# Patient Record
Sex: Female | Born: 1955
Health system: Southern US, Community
[De-identification: ages and names within clinical notes are randomized; demographics above are authoritative.]

## PROBLEM LIST (undated history)

## (undated) DIAGNOSIS — F329 Major depressive disorder, single episode, unspecified: Secondary | ICD-10-CM

## (undated) DIAGNOSIS — R51 Headache: Secondary | ICD-10-CM

## (undated) DIAGNOSIS — E785 Hyperlipidemia, unspecified: Secondary | ICD-10-CM

## (undated) DIAGNOSIS — G43909 Migraine, unspecified, not intractable, without status migrainosus: Secondary | ICD-10-CM

## (undated) DIAGNOSIS — M8580 Other specified disorders of bone density and structure, unspecified site: Secondary | ICD-10-CM

## (undated) DIAGNOSIS — D649 Anemia, unspecified: Secondary | ICD-10-CM

## (undated) DIAGNOSIS — A64 Unspecified sexually transmitted disease: Secondary | ICD-10-CM

## (undated) DIAGNOSIS — F419 Anxiety disorder, unspecified: Secondary | ICD-10-CM

## (undated) DIAGNOSIS — E559 Vitamin D deficiency, unspecified: Secondary | ICD-10-CM

## (undated) DIAGNOSIS — M5126 Other intervertebral disc displacement, lumbar region: Secondary | ICD-10-CM

## (undated) DIAGNOSIS — G2581 Restless legs syndrome: Secondary | ICD-10-CM

## (undated) DIAGNOSIS — M797 Fibromyalgia: Secondary | ICD-10-CM

## (undated) DIAGNOSIS — F32A Depression, unspecified: Secondary | ICD-10-CM

## (undated) DIAGNOSIS — J479 Bronchiectasis, uncomplicated: Secondary | ICD-10-CM

## (undated) HISTORY — DX: Fibromyalgia: M79.7

## (undated) HISTORY — DX: Anemia, unspecified: D64.9

## (undated) HISTORY — DX: Hyperlipidemia, unspecified: E78.5

## (undated) HISTORY — DX: Headache: R51

## (undated) HISTORY — PX: SHOULDER SURGERY: SHX246

## (undated) HISTORY — DX: Anxiety disorder, unspecified: F41.9

## (undated) HISTORY — PX: LUMBAR LAMINECTOMY: SHX95

## (undated) HISTORY — DX: Other specified disorders of bone density and structure, unspecified site: M85.80

## (undated) HISTORY — DX: Bronchiectasis, uncomplicated: J47.9

## (undated) HISTORY — DX: Major depressive disorder, single episode, unspecified: F32.9

## (undated) HISTORY — DX: Depression, unspecified: F32.A

## (undated) HISTORY — PX: TUBAL LIGATION: SHX77

## (undated) HISTORY — DX: Restless legs syndrome: G25.81

## (undated) HISTORY — DX: Migraine, unspecified, not intractable, without status migrainosus: G43.909

## (undated) HISTORY — DX: Other intervertebral disc displacement, lumbar region: M51.26

## (undated) HISTORY — DX: Vitamin D deficiency, unspecified: E55.9

## (undated) HISTORY — PX: BREAST SURGERY: SHX581

## (undated) HISTORY — DX: Unspecified sexually transmitted disease: A64

## (undated) HISTORY — PX: BACK SURGERY: SHX140

---

## 1999-02-24 ENCOUNTER — Ambulatory Visit: Admission: RE | Admit: 1999-02-24 | Discharge: 1999-02-24 | Payer: Self-pay | Admitting: Otolaryngology

## 1999-03-11 ENCOUNTER — Other Ambulatory Visit: Admission: RE | Admit: 1999-03-11 | Discharge: 1999-03-11 | Payer: Self-pay | Admitting: Obstetrics and Gynecology

## 2000-03-11 ENCOUNTER — Other Ambulatory Visit: Admission: RE | Admit: 2000-03-11 | Discharge: 2000-03-11 | Payer: Self-pay | Admitting: Obstetrics and Gynecology

## 2000-03-31 ENCOUNTER — Encounter: Payer: Self-pay | Admitting: Obstetrics and Gynecology

## 2000-03-31 ENCOUNTER — Encounter: Admission: RE | Admit: 2000-03-31 | Discharge: 2000-03-31 | Payer: Self-pay | Admitting: Plastic Surgery

## 2000-04-12 HISTORY — PX: AUGMENTATION MAMMAPLASTY: SUR837

## 2002-09-20 ENCOUNTER — Other Ambulatory Visit: Admission: RE | Admit: 2002-09-20 | Discharge: 2002-09-20 | Payer: Self-pay | Admitting: Obstetrics and Gynecology

## 2003-10-08 ENCOUNTER — Encounter: Admission: RE | Admit: 2003-10-08 | Discharge: 2003-10-08 | Payer: Self-pay | Admitting: Obstetrics and Gynecology

## 2003-10-16 ENCOUNTER — Other Ambulatory Visit: Admission: RE | Admit: 2003-10-16 | Discharge: 2003-10-16 | Payer: Self-pay | Admitting: Obstetrics and Gynecology

## 2004-09-29 ENCOUNTER — Encounter: Payer: Self-pay | Admitting: Gastroenterology

## 2004-10-13 HISTORY — PX: COLONOSCOPY: SHX174

## 2004-10-22 ENCOUNTER — Ambulatory Visit: Payer: Self-pay | Admitting: Gastroenterology

## 2004-10-26 ENCOUNTER — Ambulatory Visit: Payer: Self-pay | Admitting: Gastroenterology

## 2004-12-14 ENCOUNTER — Other Ambulatory Visit: Admission: RE | Admit: 2004-12-14 | Discharge: 2004-12-14 | Payer: Self-pay | Admitting: Obstetrics and Gynecology

## 2005-06-03 ENCOUNTER — Ambulatory Visit: Payer: Self-pay | Admitting: Internal Medicine

## 2005-11-12 ENCOUNTER — Ambulatory Visit: Payer: Self-pay | Admitting: Internal Medicine

## 2005-12-22 ENCOUNTER — Other Ambulatory Visit: Admission: RE | Admit: 2005-12-22 | Discharge: 2005-12-22 | Payer: Self-pay | Admitting: Obstetrics and Gynecology

## 2006-01-05 ENCOUNTER — Encounter: Admission: RE | Admit: 2006-01-05 | Discharge: 2006-01-05 | Payer: Self-pay | Admitting: Obstetrics and Gynecology

## 2006-03-05 ENCOUNTER — Ambulatory Visit: Payer: Self-pay | Admitting: Internal Medicine

## 2006-07-20 ENCOUNTER — Ambulatory Visit: Payer: Self-pay | Admitting: Internal Medicine

## 2006-08-05 ENCOUNTER — Ambulatory Visit: Payer: Self-pay | Admitting: Internal Medicine

## 2006-09-30 ENCOUNTER — Ambulatory Visit: Payer: Self-pay | Admitting: Internal Medicine

## 2006-11-23 ENCOUNTER — Ambulatory Visit: Payer: Self-pay | Admitting: Internal Medicine

## 2006-11-25 ENCOUNTER — Ambulatory Visit: Payer: Self-pay | Admitting: Internal Medicine

## 2007-02-03 ENCOUNTER — Encounter: Admission: RE | Admit: 2007-02-03 | Discharge: 2007-02-03 | Payer: Self-pay | Admitting: Sports Medicine

## 2007-02-24 ENCOUNTER — Encounter: Admission: RE | Admit: 2007-02-24 | Discharge: 2007-02-24 | Payer: Self-pay | Admitting: Obstetrics and Gynecology

## 2007-03-03 ENCOUNTER — Encounter: Admission: RE | Admit: 2007-03-03 | Discharge: 2007-03-03 | Payer: Self-pay | Admitting: Sports Medicine

## 2007-03-10 ENCOUNTER — Other Ambulatory Visit: Admission: RE | Admit: 2007-03-10 | Discharge: 2007-03-10 | Payer: Self-pay | Admitting: Obstetrics and Gynecology

## 2007-03-20 ENCOUNTER — Ambulatory Visit: Payer: Self-pay | Admitting: Internal Medicine

## 2007-04-07 ENCOUNTER — Encounter: Admission: RE | Admit: 2007-04-07 | Discharge: 2007-04-07 | Payer: Self-pay | Admitting: Sports Medicine

## 2007-04-07 ENCOUNTER — Ambulatory Visit: Payer: Self-pay | Admitting: Internal Medicine

## 2007-04-07 LAB — CONVERTED CEMR LAB
AST: 22 units/L (ref 0–37)
Albumin: 3.7 g/dL (ref 3.5–5.2)
Basophils Absolute: 0 10*3/uL (ref 0.0–0.1)
Chloride: 104 meq/L (ref 96–112)
Cholesterol: 278 mg/dL (ref 0–200)
Creatinine, Ser: 0.8 mg/dL (ref 0.4–1.2)
Eosinophils Relative: 1.5 % (ref 0.0–5.0)
GFR calc Af Amer: 97 mL/min
GFR calc non Af Amer: 80 mL/min
HCT: 34.9 % — ABNORMAL LOW (ref 36.0–46.0)
HDL: 64.9 mg/dL (ref >39.0)
Hemoglobin, Urine: NEGATIVE
Hemoglobin: 11.8 g/dL — ABNORMAL LOW (ref 12.0–15.0)
Ketones, ur: NEGATIVE mg/dL
Leukocytes, UA: NEGATIVE
Lymphocytes Relative: 37.7 % (ref 12.0–46.0)
MCHC: 33.7 g/dL (ref 30.0–36.0)
MCV: 96.8 fL (ref 78.0–100.0)
Monocytes Relative: 11 % (ref 3.0–11.0)
Neutro Abs: 2.4 10*3/uL (ref 1.4–7.7)
Neutrophils Relative %: 48.9 % (ref 43.0–77.0)
Nitrite: NEGATIVE
RBC: 3.6 M/uL — ABNORMAL LOW (ref 3.87–5.11)
RDW: 12.9 % (ref 11.5–14.6)
TSH: 1.38 microintl units/mL (ref 0.35–5.50)
Triglycerides: 129 mg/dL (ref 0–149)
Urine Glucose: NEGATIVE mg/dL
WBC: 4.8 10*3/uL (ref 4.5–10.5)
pH: 8 (ref 5.0–8.0)

## 2007-07-01 ENCOUNTER — Ambulatory Visit: Payer: Self-pay | Admitting: Internal Medicine

## 2007-08-04 ENCOUNTER — Ambulatory Visit: Payer: Self-pay | Admitting: Internal Medicine

## 2007-08-04 LAB — CONVERTED CEMR LAB
Albumin: 3.9 g/dL (ref 3.5–5.2)
Alkaline Phosphatase: 76 units/L (ref 39–117)
Bilirubin, Direct: 0.1 mg/dL (ref 0.0–0.3)
Cholesterol: 393 mg/dL (ref 0–200)
Direct LDL: 270.8 mg/dL
Rhuematoid fact SerPl-aCnc: <20 intl units/mL — ABNORMAL LOW (ref 0.0–20.0)
Sed Rate: 17 mm/hr (ref 0–25)
Total CHOL/HDL Ratio: 7.2

## 2007-09-27 ENCOUNTER — Telehealth: Payer: Self-pay | Admitting: Internal Medicine

## 2007-09-28 ENCOUNTER — Ambulatory Visit: Payer: Self-pay | Admitting: Cardiology

## 2007-10-11 ENCOUNTER — Encounter: Payer: Self-pay | Admitting: Internal Medicine

## 2007-10-31 DIAGNOSIS — R519 Headache, unspecified: Secondary | ICD-10-CM | POA: Insufficient documentation

## 2007-10-31 DIAGNOSIS — F32A Depression, unspecified: Secondary | ICD-10-CM | POA: Insufficient documentation

## 2007-10-31 DIAGNOSIS — F329 Major depressive disorder, single episode, unspecified: Secondary | ICD-10-CM

## 2007-10-31 DIAGNOSIS — F411 Generalized anxiety disorder: Secondary | ICD-10-CM | POA: Insufficient documentation

## 2007-10-31 DIAGNOSIS — R51 Headache: Secondary | ICD-10-CM | POA: Insufficient documentation

## 2007-10-31 DIAGNOSIS — E7800 Pure hypercholesterolemia, unspecified: Secondary | ICD-10-CM | POA: Insufficient documentation

## 2007-10-31 DIAGNOSIS — IMO0001 Reserved for inherently not codable concepts without codable children: Secondary | ICD-10-CM | POA: Insufficient documentation

## 2008-02-02 ENCOUNTER — Encounter: Payer: Self-pay | Admitting: Internal Medicine

## 2008-02-26 ENCOUNTER — Telehealth (INDEPENDENT_AMBULATORY_CARE_PROVIDER_SITE_OTHER): Payer: Self-pay | Admitting: *Deleted

## 2008-02-26 LAB — HM MAMMOGRAPHY: HM Mammogram: NORMAL

## 2008-03-15 ENCOUNTER — Encounter: Payer: Self-pay | Admitting: Internal Medicine

## 2008-03-15 ENCOUNTER — Other Ambulatory Visit: Admission: RE | Admit: 2008-03-15 | Discharge: 2008-03-15 | Payer: Self-pay | Admitting: Obstetrics and Gynecology

## 2008-04-05 ENCOUNTER — Telehealth: Payer: Self-pay | Admitting: Internal Medicine

## 2008-04-12 ENCOUNTER — Ambulatory Visit: Payer: Self-pay | Admitting: Internal Medicine

## 2008-04-12 DIAGNOSIS — D649 Anemia, unspecified: Secondary | ICD-10-CM | POA: Insufficient documentation

## 2008-04-12 DIAGNOSIS — J309 Allergic rhinitis, unspecified: Secondary | ICD-10-CM | POA: Insufficient documentation

## 2008-04-12 DIAGNOSIS — E785 Hyperlipidemia, unspecified: Secondary | ICD-10-CM | POA: Insufficient documentation

## 2008-05-10 ENCOUNTER — Encounter: Payer: Self-pay | Admitting: Internal Medicine

## 2008-05-21 ENCOUNTER — Encounter: Payer: Self-pay | Admitting: Internal Medicine

## 2008-05-21 DIAGNOSIS — G5600 Carpal tunnel syndrome, unspecified upper limb: Secondary | ICD-10-CM | POA: Insufficient documentation

## 2008-06-20 ENCOUNTER — Telehealth (INDEPENDENT_AMBULATORY_CARE_PROVIDER_SITE_OTHER): Payer: Self-pay | Admitting: *Deleted

## 2008-07-11 ENCOUNTER — Telehealth (INDEPENDENT_AMBULATORY_CARE_PROVIDER_SITE_OTHER): Payer: Self-pay | Admitting: *Deleted

## 2008-08-02 ENCOUNTER — Encounter: Payer: Self-pay | Admitting: Internal Medicine

## 2008-08-06 ENCOUNTER — Telehealth (INDEPENDENT_AMBULATORY_CARE_PROVIDER_SITE_OTHER): Payer: Self-pay | Admitting: *Deleted

## 2008-08-15 ENCOUNTER — Telehealth: Payer: Self-pay | Admitting: Internal Medicine

## 2008-08-16 ENCOUNTER — Ambulatory Visit: Payer: Self-pay | Admitting: Internal Medicine

## 2008-08-16 DIAGNOSIS — R0602 Shortness of breath: Secondary | ICD-10-CM | POA: Insufficient documentation

## 2008-08-16 DIAGNOSIS — R5383 Other fatigue: Secondary | ICD-10-CM

## 2008-08-16 DIAGNOSIS — R5381 Other malaise: Secondary | ICD-10-CM | POA: Insufficient documentation

## 2008-09-20 ENCOUNTER — Encounter: Payer: Self-pay | Admitting: Internal Medicine

## 2008-11-01 ENCOUNTER — Encounter: Payer: Self-pay | Admitting: Internal Medicine

## 2008-11-01 ENCOUNTER — Ambulatory Visit: Payer: Self-pay

## 2009-01-20 ENCOUNTER — Telehealth (INDEPENDENT_AMBULATORY_CARE_PROVIDER_SITE_OTHER): Payer: Self-pay | Admitting: *Deleted

## 2009-01-31 ENCOUNTER — Telehealth (INDEPENDENT_AMBULATORY_CARE_PROVIDER_SITE_OTHER): Payer: Self-pay | Admitting: *Deleted

## 2009-02-14 ENCOUNTER — Ambulatory Visit: Payer: Self-pay | Admitting: Internal Medicine

## 2009-02-14 DIAGNOSIS — M949 Disorder of cartilage, unspecified: Secondary | ICD-10-CM

## 2009-02-14 DIAGNOSIS — M899 Disorder of bone, unspecified: Secondary | ICD-10-CM | POA: Insufficient documentation

## 2009-02-21 ENCOUNTER — Encounter: Admission: RE | Admit: 2009-02-21 | Discharge: 2009-02-21 | Payer: Self-pay | Admitting: Obstetrics and Gynecology

## 2009-03-07 ENCOUNTER — Ambulatory Visit: Payer: Self-pay | Admitting: Internal Medicine

## 2009-03-07 DIAGNOSIS — J019 Acute sinusitis, unspecified: Secondary | ICD-10-CM | POA: Insufficient documentation

## 2009-03-24 ENCOUNTER — Telehealth (INDEPENDENT_AMBULATORY_CARE_PROVIDER_SITE_OTHER): Payer: Self-pay | Admitting: *Deleted

## 2009-04-25 ENCOUNTER — Encounter: Payer: Self-pay | Admitting: Internal Medicine

## 2009-06-26 ENCOUNTER — Telehealth: Payer: Self-pay | Admitting: Internal Medicine

## 2009-07-01 ENCOUNTER — Telehealth (INDEPENDENT_AMBULATORY_CARE_PROVIDER_SITE_OTHER): Payer: Self-pay | Admitting: *Deleted

## 2009-07-24 ENCOUNTER — Telehealth: Payer: Self-pay | Admitting: Internal Medicine

## 2009-07-28 ENCOUNTER — Telehealth: Payer: Self-pay | Admitting: Internal Medicine

## 2009-09-05 ENCOUNTER — Encounter: Payer: Self-pay | Admitting: Internal Medicine

## 2009-09-19 ENCOUNTER — Telehealth: Payer: Self-pay | Admitting: Internal Medicine

## 2009-10-03 ENCOUNTER — Encounter: Payer: Self-pay | Admitting: Internal Medicine

## 2009-10-13 ENCOUNTER — Telehealth: Payer: Self-pay | Admitting: Internal Medicine

## 2009-12-02 ENCOUNTER — Ambulatory Visit: Payer: Self-pay | Admitting: Internal Medicine

## 2009-12-02 DIAGNOSIS — K5289 Other specified noninfective gastroenteritis and colitis: Secondary | ICD-10-CM | POA: Insufficient documentation

## 2009-12-02 DIAGNOSIS — N390 Urinary tract infection, site not specified: Secondary | ICD-10-CM | POA: Insufficient documentation

## 2009-12-02 LAB — CONVERTED CEMR LAB
ALT: 15 units/L (ref 0–35)
AST: 17 units/L (ref 0–37)
Alkaline Phosphatase: 49 units/L (ref 39–117)
Basophils Relative: 1 % (ref 0.0–3.0)
Bilirubin, Direct: 0.1 mg/dL (ref 0.0–0.3)
Calcium: 9.1 mg/dL (ref 8.4–10.5)
Chloride: 105 meq/L (ref 96–112)
Creatinine, Ser: 0.8 mg/dL (ref 0.4–1.2)
Eosinophils Relative: 1.4 % (ref 0.0–5.0)
GFR calc non Af Amer: 79.36 mL/min (ref >60)
Glucose, Bld: 81 mg/dL (ref 70–99)
Ketones, ur: NEGATIVE mg/dL
Lipase: 12 units/L (ref 11.0–59.0)
Lymphs Abs: 1.8 10*3/uL (ref 0.7–4.0)
MCHC: 35.3 g/dL (ref 30.0–36.0)
MCV: 99.5 fL (ref 78.0–100.0)
Neutro Abs: 3.9 10*3/uL (ref 1.4–7.7)
Neutrophils Relative %: 60.7 % (ref 43.0–77.0)
Saturation Ratios: 32.9 % (ref 20.0–50.0)
Total Bilirubin: 1.1 mg/dL (ref 0.3–1.2)
Total Protein, Urine: NEGATIVE mg/dL
Total Protein: 7 g/dL (ref 6.0–8.3)
pH: 6 (ref 5.0–8.0)

## 2009-12-03 ENCOUNTER — Encounter: Payer: Self-pay | Admitting: Internal Medicine

## 2009-12-13 HISTORY — PX: SIGMOIDOSCOPY: SUR1295

## 2009-12-22 ENCOUNTER — Ambulatory Visit: Payer: Self-pay | Admitting: Internal Medicine

## 2009-12-22 ENCOUNTER — Encounter (INDEPENDENT_AMBULATORY_CARE_PROVIDER_SITE_OTHER): Payer: Self-pay | Admitting: *Deleted

## 2009-12-22 ENCOUNTER — Telehealth: Payer: Self-pay | Admitting: Internal Medicine

## 2010-01-02 ENCOUNTER — Telehealth: Payer: Self-pay | Admitting: Gastroenterology

## 2010-01-09 ENCOUNTER — Ambulatory Visit: Payer: Self-pay | Admitting: Gastroenterology

## 2010-01-09 DIAGNOSIS — K644 Residual hemorrhoidal skin tags: Secondary | ICD-10-CM | POA: Insufficient documentation

## 2010-01-09 DIAGNOSIS — K625 Hemorrhage of anus and rectum: Secondary | ICD-10-CM | POA: Insufficient documentation

## 2010-01-09 DIAGNOSIS — R197 Diarrhea, unspecified: Secondary | ICD-10-CM | POA: Insufficient documentation

## 2010-01-09 DIAGNOSIS — R109 Unspecified abdominal pain: Secondary | ICD-10-CM | POA: Insufficient documentation

## 2010-01-15 LAB — CONVERTED CEMR LAB
Glucose, Bld: 86 mg/dL (ref 70–99)
Potassium: 4.5 meq/L (ref 3.5–5.1)
Sodium: 142 meq/L (ref 135–145)

## 2010-01-20 ENCOUNTER — Encounter: Payer: Self-pay | Admitting: Physician Assistant

## 2010-01-20 ENCOUNTER — Telehealth: Payer: Self-pay | Admitting: Nurse Practitioner

## 2010-01-27 ENCOUNTER — Telehealth: Payer: Self-pay | Admitting: Physician Assistant

## 2010-02-02 ENCOUNTER — Telehealth: Payer: Self-pay | Admitting: Nurse Practitioner

## 2010-02-08 ENCOUNTER — Telehealth: Payer: Self-pay | Admitting: Internal Medicine

## 2010-02-09 ENCOUNTER — Ambulatory Visit: Payer: Self-pay | Admitting: Gastroenterology

## 2010-02-10 ENCOUNTER — Ambulatory Visit: Payer: Self-pay | Admitting: Internal Medicine

## 2010-02-10 ENCOUNTER — Encounter: Payer: Self-pay | Admitting: Gastroenterology

## 2010-02-10 DIAGNOSIS — E876 Hypokalemia: Secondary | ICD-10-CM | POA: Insufficient documentation

## 2010-02-10 LAB — CONVERTED CEMR LAB
BUN: 19 mg/dL (ref 6–23)
Basophils Relative: 0.9 % (ref 0.0–3.0)
CO2: 26 meq/L (ref 19–32)
Calcium: 9 mg/dL (ref 8.4–10.5)
Lymphocytes Relative: 23.6 % (ref 12.0–46.0)
Lymphs Abs: 1.4 10*3/uL (ref 0.7–4.0)
MCV: 95.9 fL (ref 78.0–100.0)
Neutro Abs: 3.2 10*3/uL (ref 1.4–7.7)
Neutrophils Relative %: 56.6 % (ref 43.0–77.0)
Potassium: 3.2 meq/L — ABNORMAL LOW (ref 3.5–5.1)
RBC: 3.4 M/uL — ABNORMAL LOW (ref 3.87–5.11)
Sodium: 141 meq/L (ref 135–145)
TSH: 1.39 microintl units/mL (ref 0.35–5.50)

## 2010-02-12 ENCOUNTER — Ambulatory Visit: Payer: Self-pay | Admitting: Gastroenterology

## 2010-02-12 LAB — HM SIGMOIDOSCOPY

## 2010-02-13 ENCOUNTER — Encounter: Payer: Self-pay | Admitting: Gastroenterology

## 2010-02-13 LAB — CONVERTED CEMR LAB
Chloride: 110 meq/L (ref 96–112)
Creatinine, Ser: 0.8 mg/dL (ref 0.4–1.2)
GFR calc non Af Amer: 79.3 mL/min (ref >60)
Glucose, Bld: 90 mg/dL (ref 70–99)
Sodium: 145 meq/L (ref 135–145)

## 2010-03-06 ENCOUNTER — Ambulatory Visit: Payer: Self-pay | Admitting: Gastroenterology

## 2010-04-20 ENCOUNTER — Telehealth: Payer: Self-pay | Admitting: Internal Medicine

## 2010-05-27 ENCOUNTER — Telehealth: Payer: Self-pay | Admitting: Internal Medicine

## 2010-06-01 ENCOUNTER — Encounter: Payer: Self-pay | Admitting: Internal Medicine

## 2010-06-30 ENCOUNTER — Ambulatory Visit: Payer: Self-pay | Admitting: Internal Medicine

## 2010-06-30 ENCOUNTER — Encounter: Payer: Self-pay | Admitting: Internal Medicine

## 2010-06-30 DIAGNOSIS — I959 Hypotension, unspecified: Secondary | ICD-10-CM | POA: Insufficient documentation

## 2010-06-30 DIAGNOSIS — R079 Chest pain, unspecified: Secondary | ICD-10-CM | POA: Insufficient documentation

## 2010-06-30 DIAGNOSIS — R42 Dizziness and giddiness: Secondary | ICD-10-CM | POA: Insufficient documentation

## 2010-07-01 LAB — CONVERTED CEMR LAB
BUN: 20 mg/dL (ref 6–23)
Basophils Absolute: 0 10*3/uL (ref 0.0–0.1)
Basophils Relative: 0.7 % (ref 0.0–3.0)
Bilirubin Urine: NEGATIVE
Chloride: 109 meq/L (ref 96–112)
Cholesterol: 401 mg/dL — ABNORMAL HIGH (ref 0–200)
Creatinine, Ser: 0.9 mg/dL (ref 0.4–1.2)
Direct LDL: 296.5 mg/dL
Eosinophils Absolute: 0.1 10*3/uL (ref 0.0–0.7)
GFR calc non Af Amer: 70.02 mL/min (ref >60)
HDL: 65.5 mg/dL (ref >39.00)
Hemoglobin, Urine: NEGATIVE
Hemoglobin: 12 g/dL (ref 12.0–15.0)
Leukocytes, UA: NEGATIVE
Lymphs Abs: 1.7 10*3/uL (ref 0.7–4.0)
MCV: 95.6 fL (ref 78.0–100.0)
Neutro Abs: 3.5 10*3/uL (ref 1.4–7.7)
Neutrophils Relative %: 59.6 % (ref 43.0–77.0)
Platelets: 219 10*3/uL (ref 150.0–400.0)
Sed Rate: 14 mm/hr (ref 0–22)
Sodium: 142 meq/L (ref 135–145)
Specific Gravity, Urine: 1.01 (ref 1.000–1.030)
TSH: 1.6 microintl units/mL (ref 0.35–5.50)
Total CHOL/HDL Ratio: 6
Total Protein, Urine: NEGATIVE mg/dL
Total Protein: 6.6 g/dL (ref 6.0–8.3)
Urobilinogen, UA: 0.2 (ref 0.0–1.0)
VLDL: 32.4 mg/dL (ref 0.0–40.0)
pH: 6.5 (ref 5.0–8.0)

## 2010-07-03 ENCOUNTER — Telehealth (INDEPENDENT_AMBULATORY_CARE_PROVIDER_SITE_OTHER): Payer: Self-pay | Admitting: *Deleted

## 2010-07-20 ENCOUNTER — Telehealth: Payer: Self-pay | Admitting: Internal Medicine

## 2010-07-27 ENCOUNTER — Telehealth: Payer: Self-pay | Admitting: Internal Medicine

## 2010-10-23 ENCOUNTER — Telehealth: Payer: Self-pay | Admitting: Internal Medicine

## 2010-12-08 ENCOUNTER — Encounter
Admission: RE | Admit: 2010-12-08 | Discharge: 2010-12-08 | Payer: Self-pay | Source: Home / Self Care | Attending: Obstetrics and Gynecology | Admitting: Obstetrics and Gynecology

## 2010-12-18 ENCOUNTER — Other Ambulatory Visit: Payer: Self-pay | Admitting: Internal Medicine

## 2010-12-18 ENCOUNTER — Ambulatory Visit
Admission: RE | Admit: 2010-12-18 | Discharge: 2010-12-18 | Payer: Self-pay | Source: Home / Self Care | Attending: Internal Medicine | Admitting: Internal Medicine

## 2010-12-18 LAB — HEPATIC FUNCTION PANEL
ALT: 16 U/L (ref 0–35)
AST: 20 U/L (ref 0–37)
Albumin: 4 g/dL (ref 3.5–5.2)
Alkaline Phosphatase: 66 U/L (ref 39–117)
Bilirubin, Direct: 0 mg/dL (ref 0.0–0.3)
Total Bilirubin: 0.6 mg/dL (ref 0.3–1.2)
Total Protein: 6.6 g/dL (ref 6.0–8.3)

## 2010-12-18 LAB — LDL CHOLESTEROL, DIRECT: Direct LDL: 172.7 mg/dL

## 2010-12-18 LAB — LIPID PANEL
Cholesterol: 267 mg/dL — ABNORMAL HIGH (ref 0–200)
HDL: 78 mg/dL (ref >39.00)
Total CHOL/HDL Ratio: 3
Triglycerides: 140 mg/dL (ref 0.0–149.0)
VLDL: 28 mg/dL (ref 0.0–40.0)

## 2011-01-05 ENCOUNTER — Telehealth (INDEPENDENT_AMBULATORY_CARE_PROVIDER_SITE_OTHER): Payer: Self-pay | Admitting: *Deleted

## 2011-01-07 ENCOUNTER — Encounter: Payer: Self-pay | Admitting: Internal Medicine

## 2011-01-12 NOTE — Progress Notes (Signed)
Summary: medication refill  Phone Note Refill Request Message from:  Fax from Pharmacy on July 20, 2010 3:19 PM  Refills Requested: Medication #1:  LYRICA 50 MG CAPS 2 by mouth three times a day   Dosage confirmed as above?Dosage Confirmed   Last Refilled: 12/11/2009   Notes: [facility] [address], [phone] Initial call taken by: Zella Ball Ewing CMA Duncan Dull),  July 20, 2010 3:19 PM  Follow-up for Phone Call        Rx faxed to pharmacy Follow-up by: Margaret Pyle, CMA,  July 20, 2010 3:39 PM    Prescriptions: LYRICA 50 MG CAPS (PREGABALIN) 2 by mouth three times a day  #180 x 5   Entered and Authorized by:   Corwin Levins MD   Signed by:   Corwin Levins MD on 07/20/2010   Method used:   Print then Give to Patient   RxID:   [id number]  done hardcopy to LIM side B - dahlia  Corwin Levins MD  July 20, 2010 3:33 PM

## 2011-01-12 NOTE — Progress Notes (Signed)
Summary: med Refill  Phone Note Refill Request  on Apr 20, 2010 7:59 AM  Refills Requested: Medication #1:  ZOLPIDEM TARTRATE 10 MG TABS 1po at bedtime as needed   Dosage confirmed as above?Dosage Confirmed   Notes[facility] [phone] Initial call taken by: Scharlene Gloss,  Apr 20, 2010 8:00 AM    New/Updated Medications: ZOLPIDEM TARTRATE 10 MG TABS (ZOLPIDEM TARTRATE) 1po at bedtime as needed Prescriptions: ZOLPIDEM TARTRATE 10 MG TABS (ZOLPIDEM TARTRATE) 1po at bedtime as needed  #30 x 5   Entered and Authorized by:   Corwin Levins MD   Signed by:   Corwin Levins MD on 04/20/2010   Method used:   Print then Give to Patient   RxID:   [id number]  done hardcopy to LIM side B - dahlia Corwin Levins MD  Apr 20, 2010 8:23 AM   Rx faxed to pharmacy Margaret Pyle, CMA  Apr 20, 2010 8:40 AM

## 2011-01-12 NOTE — Progress Notes (Signed)
Summary: ON CALL / WORSENING SX   Phone Note Call from Patient   Caller: Patient Call For: DR [facility] Details for Reason: DIARRHEA, PAIN, FEVER Summary of Call: CHART REVIEWED. PATIENT SEEN RECENTLY FOR POSSIBLE TOXIN NEGATIVE C.DIFF UNRESPONSIVE TO FLAGYL. WAS PRESCRIBED VANCO WHICH SHE SIAD HELPED. NOW WITH GRADUALLY RECURRENT AND WORSENING SYMPTOMS THAT INCLUDE DIARRHEA, ABDOMINAL DISCOMFORT, AND TEMP 101.5.Marland KitchenMarland KitchenMarland KitchenMarland Kitchen PLAN (1) CALLED IN VANCO 125QID X 2 WEEKS - [facility] [address] ; (2) DR STARK'S NURSE TO CALL PATIENT FOR APPOINTMENT IN OFFICE THIS WEEK WITH EXTENDER (DR [facility] IN HOSPITAL). HAVE PATIENT GET CBC, BMET, AND C.DIFF TOXIN X 3 Initial call taken by: Hilarie Fredrickson MD,  February 08, 2010 12:40 PM  Follow-up for Phone Call        I spoke with the patient this am, she says no fever this am.  Patient  started on Vanc as perscribed by Dr Marina Goodell yesterday.  She will come by for lab and stool studies today and see Willette Cluster RNP 02-10-10 10:00 Follow-up by: Darcey Nora RN, CGRN,  February 09, 2010 8:19 AM

## 2011-01-12 NOTE — Progress Notes (Signed)
Summary: REFILL AMBIEN  Phone Note Refill Request   Refills Requested: Medication #1:  ZOLPIDEM TARTRATE 10 MG TABS 1po at bedtime as needed [facility]  Initial call taken by: Lamar Sprinkles, CMA,  October 23, 2010 2:56 PM  Follow-up for Phone Call        rx [facility] to [facility] pt informed. Follow-up by: Brenton Grills CMA Duncan Dull),  October 23, 2010 3:16 PM    New/Updated Medications: ZOLPIDEM TARTRATE 10 MG TABS (ZOLPIDEM TARTRATE) 1po at bedtime as needed Prescriptions: ZOLPIDEM TARTRATE 10 MG TABS (ZOLPIDEM TARTRATE) 1po at bedtime as needed  #90 x 1   Entered and Authorized by:   Corwin Levins MD   Signed by:   Corwin Levins MD on 10/23/2010   Method used:   Print then Give to Patient   RxID:   [id number]  done hardcopy to LIM side B - dahlia Corwin Levins MD  October 23, 2010 2:59 PM

## 2011-01-12 NOTE — Assessment & Plan Note (Signed)
Summary: diarrhea/abdominal pain/fever/Christina Waller    History of Present Illness Visit Type: Follow-up Visit Primary GI MD: Elie Goody MD [facility] Primary Provider: Oliver Barre, MD Chief Complaint: Diarrhea and generalized cramping abd pain with some fevers off and on over weekend. Put called on call doctor on Sunday and he put her on vancomycin for recurrent C. diff. Pt states she can have 6-8 BM's in a day with some BRB on toilet paper. History of Present Illness:   Christina Waller was recently treated with Vancomycin for presumed C-Diff after taking Clindamycin. Initially improved with Vancomycin and Questran but for last several weeks her diarrhea has waxed and waned. No recent antibiotics, other than what we prescribed. At one time we planned for a flexible sigmoidoscopy but held off as diarrhea improved. Patient relapsed over the weekend having greater than 12 loose stools a day associated with fevers of 101.5 and diffuse abdominal pain. She called our on-call physician who restarted Vancomycin, ordered labs and advised office follow up. Diarrhea is slowing with Vancomycin and abdominal pain better with Bentyl.     GI Review of Systems    Reports abdominal pain and  nausea.     Location of  Abdominal pain: generalized.    Denies acid reflux, belching, bloating, chest pain, dysphagia with liquids, dysphagia with solids, heartburn, loss of appetite, vomiting, vomiting blood, weight loss, and  weight gain.      Reports change in bowel habits and  diarrhea.     Denies anal fissure, black tarry stools, constipation, diverticulosis, fecal incontinence, heme positive stool, hemorrhoids, irritable bowel syndrome, jaundice, light color stool, liver problems, rectal bleeding, and  rectal pain.    Current Medications (verified): 1)  Sumatriptan Succinate 100 Mg Tabs (Sumatriptan Succinate) .[name].. 1 By Mouth Once Daily As Needed 2)  Klonopin 0.5 Mg  Tabs (Clonazepam) .[name].. 1 Po in The Am and 1- 2  By Mouth At  Bedtime As Needed 3)  Lyrica 50 Mg Caps (Pregabalin) .... 2 By Mouth Three Times A Day 4)  Cymbalta 60 Mg Cpep (Duloxetine Hcl) .[name].. 1 Tab Every Day 5)  Zolpidem Tartrate 10 Mg Tabs (Zolpidem Tartrate) .[name].. 1po At Bedtime As Needed 6)  Topamax 25 Mg Tabs (Topiramate) .... 3 By Mouth At Bedtime 7)  Robaxin 500 Mg Tabs (Methocarbamol) .[name].. 1 By Mouth As Needed 8)  Anusol-Hc 25 Mg Supp (Hydrocortisone Acetate) .... Use 1 Suppository At Bedtime X 7 Days 9)  Bentyl 10 Mg Caps (Dicyclomine Hcl) .... Take 1 Tab Twice Daily For Cramping, Spasms 10)  Vancocin Hcl 250 Mg Caps (Vancomycin Hcl) .... Take 1 Tab 4 Times Daily X 14 Days 11)  Questran 4 Gm Pack (Cholestyramine) .... Take 4 Grams in Water or Juice in The Am.  Take 2 Hours Away From The Vancomycin.  Allergies (verified): No Known Drug Allergies  Past History:  Past Medical History: Reviewed history from 01/09/2010 and no changes required. Depression Anxiety Headache Restless leg syndrome Hyperlipidemia migraine fibromyalgia Allergic rhinitis Anemia-NOS Osteopenia Vit D deficiency  Past Surgical History: Reviewed history from 10/31/2007 and no changes required. Tubal ligation-bilat  Family History: Reviewed history from 01/09/2010 and no changes required. grandfather with heart disease ulcer disease arthritis depression HTN granmother with osteoporosis No FH of Colon Cancer:  Social History: Reviewed history from 01/09/2010 and no changes required. 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  The patient denies allergy/sinus, anemia, anxiety-new, arthritis/joint pain, back pain, blood in urine, breast changes/lumps, change in vision, confusion, cough, coughing up blood, depression-new, headaches-new, hearing problems, heart murmur, heart rhythm  changes, itching, menstrual pain, muscle pains/cramps, nosebleeds, pregnancy symptoms, shortness of breath, skin rash, sleeping problems, sore throat, swelling of feet/legs, swollen lymph glands, thirst - excessive , urination - excessive , urination changes/pain, urine leakage, vision changes, and voice change.    Vital Signs:  Patient profile:   55 year old female Height:      69 inches Weight:      139.38 pounds BMI:     20.66 Temp:     98.8 degrees F oral Pulse rate:   90 / minute Pulse rhythm:   regular BP sitting:   90 / 58  (left arm) Cuff size:   regular  Vitals Entered By: Christie Nottingham CMA Duncan Dull) (February 10, 2010 10:17 AM)  Physical Exam  General:  Well developed, well nourished, no acute distress. Head:  Normocephalic and atraumatic. Lungs:  Clear throughout to auscultation. Heart:  Regular rate and rhythm; no murmurs, rubs,  or bruits. Abdomen:  Abdomen soft, nondistended. Mild, diffuse tendernerss.  No obvious masses or hepatomegaly.Normal bowel sounds.  Skin:  Intact without significant lesions or rashes. Psych:  Alert and cooperative. Normal mood and affect.   Impression & Recommendations:  Problem # 1:  DIARRHEA-PRESUMED INFECTIOUS (ICD-009.3) Assessment Deteriorated Originally treated end of January for presumed C-Diff after taking Clindamycin. Now with recurrent diarrhea, associated with fever and diffuse abdominal discomfort. Started back on Vancomycin over the weekend, stool studies are pending. Patient's WBC is normal, abdomen is mildly tender without distention. Patient is hemodynamically stable, she looks fine. Continue Vancomycin. The patient will be scheduled for a flexible sigmoidoscopy /biopsies (if inicated).  The risks and benefits of the procedure were discussed with the patient and she agrees to proceed.  Orders: Flex with Sedation (Flex w/Sed)  Problem # 2:  HYPOKALEMIA (ICD-276.8) Assessment: New Secondary to diarrhea. Will replete and recheck  BMET Thursday.  Patient Instructions: 1)  We scheduled the Flexible Sigmoidoscopy with Dr. Russella Dar for Thurs 02-12-10.  Directions and brochure given. 2)  Go to the lab before the procedure.  The lab opens at 7;30 Am You need to arrive on the 4th floor at 8Am .  3)  I am sending a perscription for  potassium tablets to your pharmacy.  Potassium 20 meq  Take 2  tabs Tues 3-1 and 2 tabs  Wed 3-2.  4)  Copy sent to : Oliver Barre, MD 5)  The medication list was reviewed and reconciled.  All changed / newly prescribed medications were explained.  A complete medication list was provided to the patient / caregiver. Prescriptions: KLOR-CON M20 20 MEQ CR-TABS (POTASSIUM CHLORIDE CRYS CR) Take 2 tab today 02-10-10  and 2 tab tomorrow ,02-11-10  #4 x 0   Entered by:   Lowry Ram NCMA   Authorized by:   Willette Cluster NP   Signed by:   Lowry Ram NCMA on 02/10/2010   Method used:   Electronically to        [facility]  [phone]* (retail)       [address]       [place], [place]  [address]       Ph: [phone] or [phone]  Fax: [phone]   RxID:   [id number]

## 2011-01-12 NOTE — Progress Notes (Signed)
----   Converted from flag ---- ---- 07/02/2010 3:27 PM, Edman Circle wrote: appt 7/28 @ 10:00   ---- 07/02/2010 1:54 PM, Dagoberto Reef wrote: Christina Waller, just schedule pt appt i cant get him to direct answer.  Thanks  ---- 07/02/2010 12:43 PM, Corwin Levins MD wrote: the two are not related, I have no opinion  ---- 07/02/2010 10:34 AM, Dagoberto Reef wrote: Dr Jonny Ruiz,  Does this pt need Lipid appt before her appt to see dr at cardiology or do you wont to wait and see if she need appt after she see's  the dr at cardiology.Please advise.  ---- 07/01/2010 4:30 PM, Edman Circle wrote: ok so I'm little confused still does Dr Jonny Ruiz want her to have the Lipid appt of wait for her appt. It's actually 8/26 sorry looked at date wrong.   ---- 07/01/2010 4:00 PM, Dagoberto Reef wrote: Christina Waller, see note below.   Thanks  ---- 07/01/2010 12:21 PM, Corwin Levins MD wrote: sorry, she just re-started her statin -  OK for first visit to be 4 wks  ---- 07/01/2010 10:16 AM, Dagoberto Reef wrote: Dr Jonny Ruiz, please see Christina Waller note below.  Thanks  ---- 07/01/2010 8:18 AM, Edman Circle wrote: pt coming next Tuesday to see Dr should we wait til then to see if she needs Lipid appt?? she is coming as new pt per Deborah's referral   ---- 07/01/2010 8:09 AM, Dagoberto Reef wrote: Thanks  ---- 06/30/2010 5:30 PM, Corwin Levins MD wrote: The following orders have been entered for this patient and placed on Admin Hold:  Type:     Referral       Code:   Misc. Ref Description:   Misc. Referral Order Date:   06/30/2010   Authorized By:   Corwin Levins MD Order #:   [id number] Clinical Notes:   Type of Referral: Lipid clinic Reason: ------------------------------

## 2011-01-12 NOTE — Progress Notes (Signed)
Summary: Triage   Phone Note Call from Patient Call back at Work Phone [phone]   Caller: Patient Call For: Christina Waller Reason for Call: Talk to Nurse Summary of Call: Pt is still having problems with her stomach and diarrhea Initial call taken by: Karna Christmas,  February 02, 2010 12:46 PM  Follow-up for Phone Call        LM for pt to call me.  I mentioned I saw that she spoke with Hulan Saas here and was feeling some better and wanted to hold off on Flex Sig but calling today with problems.   Follow-up by: Joselyn Glassman,  February 02, 2010 2:18 PM  Additional Follow-up for Phone Call Additional follow up Details #1::        The pt returned my call. She was feeling better most of last week but did not have a good weekend.  She took Questran  several times over the weekend.  She had watery diarrhea Sat evening and had alot of cramping. She ate light on Sat but did admit to eating a cheeseburger Sat evening.. She had watery diarrhea several times Sat PM and took Imodium due to having to drive to [facility].  [place] feels the pt should have a Flex Sig.  Gunnar Fusi asked me to send this to you Dr Russella Dar for your opinion. You had done a Colonoscopy on this pt in 2005.     Additional Follow-up for Phone Call Additional follow up Details #2::    OK to hold off on Flex Sig or other evaluation. Can f/u with me as needed. Follow-up by: Meryl Dare MD Clementeen Graham,  February 02, 2010 4:27 PM  Additional Follow-up for Phone Call Additional follow up Details #3:: Details for Additional Follow-up Action Taken: Pam, let's have her come in for c-diff x 3, c&s and lactoferrin. Also, please get her an appt. with Dr. Russella Dar. We can give her anti-spasmotic for cramps (Bentyl 10mg  twice daily) if she desires. Thanks Per Christina Gip PA-C, she spoke to Dr. Russella Dar and since it has been 6 years since her Colonoscopy we should look at scheduling one.  I LM for pt to call me and mentioned on my message what Dr. Russella Dar  suggests.  Pt called back and I tried to schedule  Colonoscopy but pt's work schedule doesn't jive with Dr. Anselm Jungling [facility] schedule. She has felt good since the weekend.  She has an appt to see Dr Russella Dar on 03-06-10 in the office. I urged her to call me between now and then if more reoccuring problems and she may have to take off work to have the Colonoscopy.  LM for pt to call me and give me progress report and we can try to schedule the Colonoscopy.

## 2011-01-12 NOTE — Procedures (Signed)
Summary: [facility] COLON   Colonoscopy  Procedure date:  10/26/2004  Findings:      Location:  [facility].   Patient Name: Christina Waller, Christina Waller MRN:  Procedure Procedures: Colonoscopy CPT: [phone].    with biopsy. CPT: [id number].  Personnel: Endoscopist: Venita Lick. Russella Dar, MD, Clementeen Graham.  Referred By: Corwin Levins, MD.  Exam Location: Exam performed in Outpatient Clinic. Outpatient  Patient Consent: Procedure, Alternatives, Risks and Benefits discussed, consent obtained, from patient. Consent was obtained by the RN.  Indications Symptoms: Diarrhea Hematochezia. Abdominal pain / bloating.  History  Current Medications: Patient is not currently taking Coumadin.  Pre-Exam Physical: Performed Oct 26, 2004. Cardio-pulmonary exam WNL. Rectal exam abnormal. HEENT exam , Abdominal exam, Extremity exam, Neurological exam, Mental status exam WNL. Abnormal PE findings include: ext. hem.  Exam Exam: Extent of exam reached: Terminal Ileum, extent intended: Terminal Ileum.  The cecum was identified by appendiceal orifice and IC valve. Colon retroflexion performed. ASA Classification: II. Tolerance: good.  Monitoring: Pulse and BP monitoring, Oximetry used. Supplemental O2 given.  Colon Prep Used Miralax for colon prep. Prep results: good.  Sedation Meds: Patient assessed and found to be appropriate for moderate (conscious) sedation. Fentanyl 150 mcg. given IV. Versed 16 given IV.  Findings NORMAL EXAM: Terminal Ileum to Rectum. Biopsy/Normal Exam taken. Comments: random biopsies taken in the asc., trans., sig.   Assessment  Diagnoses: 455.3: Hemorrhoids, External.   Events  Unplanned Interventions: No intervention was required.  Unplanned Events: There were no complications. Plans Medication Plan: Await pathology. Continue current medications. Antispasmodics: Robinul forte BID prn,   Patient Education: Patient given standard instructions for: Hemorrhoids. IBS.    Disposition: After procedure patient sent to recovery. After recovery patient sent home.  Scheduling/Referral: Colonoscopy, to [facility] T. Russella Dar, MD, [facility], around Oct 26, 2012.  Office Visit, to [employer]. Russella Dar, MD, Clementeen Graham, prn    cc: Corwin Levins, MD  This report was created from the original endoscopy report, which was reviewed and signed by the above listed endoscopist.

## 2011-01-12 NOTE — Assessment & Plan Note (Signed)
Summary: GASTRITIS UNSPECIFIED NON INFECTIOUS/YF      (DR.STARK PT.) D...    History of Present Illness Visit Type: Follow-up Visit Primary GI MD: Elie Goody MD [facility],The Primary Provider: Oliver Barre, MD Chief Complaint: c/o abdominal burning, diarrhea, had nausea and now has subsided was treated for C-Diff  by Dr. Jonny Ruiz with Flagyl has completed History of Present Illness:   In December took Clindamycin for dental work. Then took Cipro mid December for UTI, subsequently developed nausea/ vomiing / diarrhea and adominal burning. Saw PCP on 12/22, stool studies negative. Given and completed 10 days of Flagyl without improvement. Then given short course of Prednisone which helped. Was feeling almost normal until diarrhea returned just one day prior to completing the Prednisone. Been on bland diet. Has intermittent lightheadedness. Her diarrhea not as bad but still associated with urgency. Having 6-7 bms a day though they aren't watery. No unusual odor. No fevers. No nausea. Having some bright red blood on tissue / in the toilet. Has hemorrhoids now.    GI Review of Systems    Reports abdominal pain.     Location of  Abdominal pain: generalized.    Denies acid reflux, belching, bloating, chest pain, dysphagia with liquids, dysphagia with solids, heartburn, loss of appetite, nausea, vomiting, vomiting blood, weight loss, and  weight gain.      Reports change in bowel habits, diarrhea, and  rectal bleeding.     Denies anal fissure, black tarry stools, constipation, diverticulosis, fecal incontinence, heme positive stool, hemorrhoids, irritable bowel syndrome, jaundice, light color stool, and  liver problems. Preventive Screening-Counseling & Management      Drug Use:  no.      Current Medications (verified): 1)  Sumatriptan Succinate 100 Mg Tabs (Sumatriptan Succinate) .[name].. 1 By Mouth Once Daily As Needed 2)  Klonopin 0.5 Mg  Tabs (Clonazepam) .[name].. 1 Po in The Am and 1- 2  By Mouth At Bedtime  As Needed 3)  Lyrica 50 Mg Caps (Pregabalin) .... 2 By Mouth Three Times A Day 4)  Cymbalta 60 Mg Cpep (Duloxetine Hcl) .[name].. 1 Tab Every Day 5)  Zolpidem Tartrate 10 Mg Tabs (Zolpidem Tartrate) .[name].. 1po At Bedtime As Needed 6)  Topamax 25 Mg Tabs (Topiramate) .... 3 By Mouth At Bedtime 7)  Robaxin 500 Mg Tabs (Methocarbamol) .[name].. 1 By Mouth As Needed  Allergies (verified): No Known Drug Allergies  Past History:  Past Medical History: Depression Anxiety Headache Restless leg syndrome Hyperlipidemia migraine fibromyalgia Allergic rhinitis Anemia-NOS Osteopenia Vit D deficiency  Past Surgical History: Reviewed history from 10/31/2007 and no changes required. Tubal ligation-bilat  Family History: Reviewed history from 02/14/2009 and no changes required. grandfather with heart disease ulcer disease arthritis depression HTN granmother with osteoporosis No FH of Colon Cancer:  Social History: Reviewed history from 03/07/2009 and no changes required. 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  The patient denies allergy/sinus, anemia, arthritis/joint pain, back pain, blood in urine, breast changes/lumps, change in vision, confusion, cough, coughing up blood, fainting, fever, hearing problems, heart murmur, heart rhythm changes, itching, menstrual pain, muscle pains/cramps, night sweats, nosebleeds, pregnancy symptoms, skin rash, sleeping problems, sore throat, swelling of feet/legs, swollen lymph glands, thirst - excessive , urination - excessive , urination  changes/pain, urine leakage, vision changes, and voice change.    Vital Signs:  Patient profile:   55 year old female Height:      69 inches Weight:      143.38 pounds BMI:      21.25 Pulse rate:   88 / minute Pulse rhythm:   regular BP sitting:   82 / 56  (left arm)  Vitals Entered By: Milford Cage NCMA (January 09, 2010 10:41 AM)  Physical Exam  General:  Well developed, well nourished, no acute distress. Head:  Normocephalic and atraumatic. Eyes:  Conjunctiva pink, no icterus.  Mouth:  No oral lesions. Tongue moist.  Neck:  no obvious masses  Lungs:  Clear throughout to auscultation. Heart:  Regular rate and rhythm; no murmurs, rubs,  or bruits. Abdomen:  Abdomen soft, nontender, nondistended. No obvious masses or hepatomegaly.Normal bowel sounds.  Rectal:  One mildly inflamed hemorrhoid. Heme positive light brown stool Msk:  Symmetrical with no gross deformities. Normal posture. Extremities:  No palmar erythema, no edema.  Neurologic:  Alert and  oriented x4;  grossly normal neurologically. Skin:  Intact without significant lesions or rashes. Cervical Nodes:  No significant cervical adenopathy. Psych:  Alert and cooperative. Normal mood and affect.   Impression & Recommendations:  Problem # 1:  DIARRHEA-PRESUMED INFECTIOUS (ICD-009.3) Assessment New Diarrhea started after Clindamycin and then Cipro. C-Diff negative x 1, other stool studies negative. No improvment with Flagyl. Still suspicious of C-Difficile. Could be post-infectious since diarrhea started with nausea and vomiting. Will try a course of Vancomycin. Add Librarian, academic. Abdominal exam is benign , she doesn't look toxic.  Push fluids. Low residue diet. Obtain BMET to check electrolytes.  Bentyl for associated abdominal discomfort.  Follow up appt. in 2-3 weeks if better, otherwise may need flexible sigmoidoscopy vrs. colonoscopy for further evaluation.   Problem # 2:  HEMORRHOIDS-EXTERNAL (ICD-455.3) Assessment: New Mildy inflamed hemorrhoid on exam. Likely has internal hemorrhoids as well. She is up to date on CRC screening. Bleeding likely perianal in nature.  Other Orders: TLB-BMP (Basic  Metabolic Panel-BMET) (80048-METABOL)  Patient Instructions: 1)  We sent perscriptions for Bentyl and Anusol Suppositories to [facility] [address] 2)  We called in the perscription for Vancomycin 250MG  to [facility]  Your co-pay will be $45.00.  3)  Advised to stick with a low residue diet  avoiding food that can irritate bowel (see handout).  4)  Copy sent to : Oliver Barre, MD 5)  The medication list was reviewed and reconciled.  All changed / newly prescribed medications were explained.  A complete medication list was provided to the patient / caregiver. Prescriptions: VANCOCIN HCL 250 MG CAPS (VANCOMYCIN HCL) Take 1 tab 4 times daily x 14 days  #56 x 0   Entered by:   Lowry Ram NCMA   Authorized by:   Willette Cluster NP   Signed by:   Lowry Ram NCMA on 01/09/2010   Method used:   Telephoned to ...       [phone][facility] (retail)       [address]       [place], [place]  [address]       Ph: [phone] or [phone]       Fax: [phone]   RxID:   [id number] BENTYL 10 MG CAPS (DICYCLOMINE HCL) Take 1 tab twice daily for cramping, spasms  #60 x 1   Entered by:   Lowry Ram NCMA   Authorized by:  Willette Cluster NP   Signed by:   Lowry Ram NCMA on 01/09/2010   Method used:   Electronically to        [phone][facility] (retail)       [address]       [place], [place]  [address]       Ph: [phone] or [phone]       Fax: [phone]   RxID:   [id number] ANUSOL-HC 25 MG SUPP (HYDROCORTISONE ACETATE) Use 1 suppository at bedtime x 7 days  #7 x 1   Entered by:   Lowry Ram NCMA   Authorized by:   Willette Cluster NP   Signed by:   Lowry Ram NCMA on 01/09/2010   Method used:   Electronically to        [phone][facility] (retail)       [address]       [place], [place]  [address]       Ph: [phone] or [phone]       Fax: [phone]   RxID:   [id number]

## 2011-01-12 NOTE — Progress Notes (Signed)
Summary: Sumatriptan PA  Phone Note From Pharmacy   Details of Request: [employer] ID:  [id number] Summary of Call: PA request--Sumatriptan. Form requested. Initial call taken by: Lucious Groves,  May 27, 2010 3:07 PM  Follow-up for Phone Call        ok to pursue Follow-up by: Corwin Levins MD,  May 27, 2010 5:25 PM  Additional Follow-up for Phone Call Additional follow up Details #1::        Form has been requested 2x. Lucious Groves  May 29, 2010 10:25 AM     Additional Follow-up for Phone Call Additional follow up Details #2::    I was notified that [employer] does not do prior authorization for the patient. I confirmed with [employer] that the patient can received #9 per month without prior authorization. Pharmacy notified and prescription has already been filled. Follow-up by: Lucious Groves,  June 02, 2010 2:08 PM

## 2011-01-12 NOTE — Letter (Signed)
Summary: [facility]  [address] [place], [place] [address]   Phone: [phone]  Fax: [phone]       Christina Waller    1956/08/30    MRN: [id number]        Procedure Day /Date: 02-12-10     Arrival Time :8:00 AM      Procedure Time: 9:00 AM     Location of Procedure:                    X     [facility] (4th Floor)   PREPARATION FOR FLEXIBLE SIGMOIDOSCOPY WITH MAGNESIUM CITRATE  Prior to the day before your procedure, purchase one 8 oz. bottle of Magnesium Citrate and one Fleet Enema from the laxative section of your drugstore.  _________________________________________________________________________________________________  THE DAY BEFORE YOUR PROCEDURE             DATE: 02-11-10      DAY: Wednesday  1.   Have a clear liquid dinner the night before your procedure.  2.   Do not drink anything colored red or purple.  Avoid juices with pulp.  No orange juice.              CLEAR LIQUIDS INCLUDE: Water Jello Ice Popsicles Tea (sugar ok, no milk/cream) Powdered fruit flavored drinks Coffee (sugar ok, no milk/cream) Gatorade Juice: apple, white grape, white cranberry  Lemonade Clear bullion, consomm, broth Carbonated beverages (any kind) Strained chicken noodle soup Hard Candy   3.   At 7:00 pm the night before your procedure, drink one bottle of Magnesium Citrate over ice.  4.   Drink at least 3 more glasses of clear liquids before bedtime (preferably juices).  5.   Results are expected usually within 1 to 6 hours after taking the Magnesium Citrate.  ___________________________________________________________________________________________________  THE DAY OF YOUR PROCEDURE            DATE: 02-12-10   DAY: Thursday  1.   Use Fleet Enema one hour prior to coming for procedure.  2.   You may drink clear liquids until 7:00 AM (2 hours before exam)       MEDICATION INSTRUCTIONS  Unless otherwise instructed,  you should take regular prescription medications with a small sip of water as early as possible the morning of your procedure.       OTHER INSTRUCTIONS  You will need a responsible adult at least 56 years of age to accompany you and drive you home.   This person must remain in the waiting room during your procedure.  Wear loose fitting clothing that is easily removed.  Leave jewelry and other valuables at home.  However, you may wish to bring a book to read or an iPod/MP3 player to listen to music as you wait for your procedure to start.  Remove all body piercing jewelry and leave at home.  Total time from sign-in until discharge is approximately 2-3 hours.  You should go home directly after your procedure and rest.  You can resume normal activities the day after your procedure.  The day of your procedure you should not:   Drive   Make legal decisions   Operate machinery   Drink alcohol   Return to work  You will receive specific instructions about eating, activities and medications before you leave.   The above instructions have been reviewed and explained to me by   _______________________    I fully understand  and can verbalize these instructions _____________________________ Date _________

## 2011-01-12 NOTE — Assessment & Plan Note (Signed)
Summary: low bp/offered sda/req 7/19..cd   Vital Signs:  Patient profile:   55 year old female Height:      69 inches Weight:      138 pounds BMI:     20.45 O2 Sat:      98 % on Room air Temp:     98.9 degrees F oral Pulse rate:   91 / minute BP sitting:   80 / 60  (left arm) BP standing:   78 / 58 Cuff size:   regular  Vitals Entered By: Zella Ball Ewing CMA (AAMA) (June 30, 2010 11:19 AM)  O2 Flow:  Room air  Serial Vital Signs/Assessments:  Time      Position  BP       Pulse  Resp  Temp     By 12:30 PM  Lying LA  86/60                          Lamar Sprinkles, CMA 12:30 PM  Sitting   84/60                          Lamar Sprinkles, CMA 12:30 PM  Standing  78/58                          Lamar Sprinkles, CMA  CC: Low BP/RE   Primary Care Provider:  Oliver Barre, MD  CC:  Low BP/RE.  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 No hx of .  No polydipsia, polyuria.  Thyroid not checked recently.  Pt denies CP, sob, doe, wheezing, orthopnea, pnd, worsening LE edema, palps, or syncope  Pt denies new neuro symptoms such as headache, facial or extremity weakness  No fever, wt loss, night sweats, loss of appetite or other constitutional symptoms except for wt loss with most recent c diff infectious episode,. Currently with some increased freq BM's but not watery  - just loose, no blood. No n/v.  Most recent wt about the same. Does have some pain to the upper abd  - ? rib cage and wasnt going to bring it up todya - has to lean forward to work on patients.  NO bladder changes such as pain or blood. Has actually been adding some salt to the diet lately.  BP at work or drug store fairly often in the 80's.  No falls or  injury.  No new meds, has been away from her chol med and wants to re-start, but thinks crestor caused increased pains to the joints and muscle on top of the FMS.   Problems Prior to Update: 1)  Chest Pain  (ICD-786.50) 2)  Orthostatic Dizziness  (ICD-780.4) 3)  Unspecified Hypotension  (ICD-458.9) 4)  Hypokalemia  (ICD-276.8) 5)  Hemorrhoids-external  (ICD-455.3) 6)  Abdominal Pain-multiple Sites  (ICD-789.09) 7)  Diarrhea-presumed Infectious  (ICD-009.3) 8)  Rectal Bleeding  (ICD-569.3) 9)  Uti  (ICD-599.0) 10)  Oth&unspec Noninfectious Gastroenteritis&colitis  (ICD-558.9) 11)  Sinusitis- Acute-nos  (ICD-461.9) 12)  Preventive Health Care  (ICD-V70.0) 13)  Osteopenia  (ICD-733.90) 14)  Fatigue  (ICD-780.79)  15)  Dyspnea  (ICD-786.05) 16)  Carpal Tunnel Syndrome, Left  (ICD-354.0) 17)  Preventive Health Care  (ICD-V70.0) 18)  Anemia-nos  (ICD-285.9) 19)  Allergic Rhinitis  (ICD-477.9) 20)  Hyperlipidemia  (ICD-272.4) 21)  Headache  (ICD-784.0) 22)  Anxiety  (ICD-300.00) 23)  Depression  (ICD-311) 24)  Fibromyalgia  (ICD-729.1) 25)  Hyperlipidemia, Familial  (ICD-272.0)  Medications Prior to Update: 1)  Sumatriptan Succinate 100 Mg Tabs (Sumatriptan Succinate) .[name].. 1 By Mouth Once Daily As Needed 2)  Klonopin 0.5 Mg  Tabs (Clonazepam) .[name].. 1 Po in The Am and 1- 2  By Mouth At Bedtime As Needed 3)  Lyrica 50 Mg Caps (Pregabalin) .... 2 By Mouth Three Times A Day 4)  Cymbalta 30 Mg Cpep (Duloxetine Hcl) .... Takes 90mg  Daily 5)  Zolpidem Tartrate 10 Mg Tabs (Zolpidem Tartrate) .[name].. 1po At Bedtime As Needed 6)  Topamax 100 Mg Tabs (Topiramate) .... One Tablet By Mouth Once Daily At Bedtime 7)  Robaxin 500 Mg Tabs (Methocarbamol) .[name].. 1 By Mouth As Needed 8)  Wellbutrin Xl 150 Mg Xr24h-Tab (Bupropion Hcl) .... One Tablet By Mouth Once Daily 9)  Dicyclomine Hcl 10 Mg Caps (Dicyclomine Hcl) .... Take 1 Tab Twice Daily  Current Medications (verified): 1)  Sumatriptan Succinate 100  Mg Tabs (Sumatriptan Succinate) .[name].. 1 By Mouth Once Daily As Needed 2)  Klonopin 0.5 Mg  Tabs (Clonazepam) .[name].. 1 Po in The Am and 1- 2  By Mouth At Bedtime As Needed 3)  Lyrica 50 Mg Caps (Pregabalin) .... 2 By Mouth Three Times A Day 4)  Cymbalta 30 Mg Cpep (Duloxetine Hcl) .... Takes 90mg  Daily 5)  Zolpidem Tartrate 10 Mg Tabs (Zolpidem Tartrate) .[name].. 1po At Bedtime As Needed 6)  Topamax 100 Mg Tabs (Topiramate) .... One Tablet By Mouth Once Daily At Bedtime 7)  Robaxin 500 Mg Tabs (Methocarbamol) .[name].. 1 By Mouth As Needed 8)  Wellbutrin Xl 150 Mg Xr24h-Tab (Bupropion Hcl) .... One Tablet By Mouth Once Daily 9)  Dicyclomine Hcl 10 Mg Caps (Dicyclomine Hcl) .... Take 1 Tab Twice Daily 10)  Lipitor 20 Mg Tabs (Atorvastatin Calcium) .[name].. 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         all otherwise negative  per pt -    Physical Exam  General:  alert and well-developed.   Head:  normocephalic and atraumatic.   Eyes:  vision grossly intact, pupils equal, and pupils round.   Ears:  R ear normal and L ear normal.   Nose:  no external deformity and no nasal discharge.   Mouth:  no gingival abnormalities and pharynx pink and moist.   Neck:  supple and  no masses.   Lungs:  normal respiratory effort and normal breath sounds.   Heart:  normal rate and regular rhythm.   Abdomen:  soft, non-tender, and normal bowel sounds.   Msk:  no joint tenderness and no joint swelling.  , has some tender to the left costal margin upper abd/rib without swelling or redness Extremities:  no edema, no erythema  Neurologic:  cranial nerves II-XII intact and strength normal in all extremities.   Skin:  color normal and no rashes.   Psych:  not depressed appearing and moderately anxious.      Impression & Recommendations:  Problem # 1:  UNSPECIFIED HYPOTENSION (ICD-458.9)  unclear etiology, 10/2008 echo reviewed, orthostatics checked today,  to incr salt in diet push by mouth fluids, immodium as needed loose stool   but doubt GI issues and dehydration the primary problem - ? autonomic neuropathy;  to check routine labs  and will ask cardiology to see as well  note:  for billing purposes: total time > 45 min for pt hx, PE, echo review with pt, recent lab review, and d/w pt findings and plan for further eval and management Orders: TLB-BMP (Basic Metabolic Panel-BMET) (80048-METABOL) TLB-CBC Platelet - w/Differential (85025-CBCD) TLB-Hepatic/Liver Function Pnl (80076-HEPATIC) TLB-TSH (Thyroid Stimulating Hormone) (84443-TSH) TLB-Udip w/ Micro (81001-URINE) TLB-Sedimentation Rate (ESR) (85652-ESR) Cardiology Referral (Cardiology)  Problem # 2:  ORTHOSTATIC DIZZINESS (ICD-780.4)  with near syncope - as above  Orders: Cardiology Referral (Cardiology)  Problem # 3:  CHEST PAIN (ICD-786.50)  suspect MSK  disdcomfort to left costal margin - to check cxr, ecg  Orders: T-2 View CXR, Same Day (71020.5TC) EKG w/ Interpretation (93000) Cardiology Referral (Cardiology)  Problem # 4:  HYPERLIPIDEMIA (ICD-272.4)  Labs Reviewed: SGOT: 17 (12/02/2009)   SGPT: 15 (12/02/2009)   HDL:54.8 (08/04/2007), 64.9 (04/07/2007)  LDL:DEL (08/04/2007), DEL (04/07/2007)  Chol:393 (08/04/2007), 278 (04/07/2007)  Trig:409 (08/04/2007), 129 (04/07/2007) to re-start the statin - Pt to continue diet efforts, ; to check labs - goal LDL less than 70 , to start lipitor 20 mg  Her updated medication list for this problem includes:    Lipitor 20 Mg Tabs (Atorvastatin calcium) .[name]... 1po once daily  Orders: TLB-Lipid Panel (80061-LIPID)  Complete Medication List: 1)  Sumatriptan Succinate 100 Mg Tabs (Sumatriptan succinate) .[name].. 1 by mouth once daily as needed 2)  Klonopin 0.5 Mg Tabs (Clonazepam) .[name].. 1 po in the am and 1- 2  by mouth at bedtime as needed 3)  Lyrica 50 Mg Caps (Pregabalin) .... 2 by mouth three times a day 4)  Cymbalta 30 Mg Cpep (Duloxetine hcl) .... Takes 90mg  daily 5)  Zolpidem Tartrate 10 Mg Tabs (Zolpidem tartrate) .[name].. 1po at bedtime as needed 6)  Topamax 100 Mg Tabs (Topiramate) .... One tablet by mouth once daily at bedtime 7)  Robaxin 500 Mg Tabs (Methocarbamol) .[name].. 1 by mouth as needed 8)  Wellbutrin Xl 150 Mg Xr24h-tab (Bupropion hcl) .... One tablet by mouth once daily 9)  Dicyclomine Hcl 10 Mg Caps (Dicyclomine hcl) .... Take 1 tab twice daily 10)  Lipitor 20 Mg Tabs (Atorvastatin calcium) .[name].. 1po once daily  Patient Instructions: 1)  Please take all new medications as prescribed - the lipitor  2)  Please increase your salt and fluids for now 3)  Please go to the Lab in the basement for your blood and/or urine tests today 4)  Please go to Radiology in the basement level for your X-Ray today  5)  Your EKG was  ok today 6)  You will be contacted about the referral(s) to: cardiology 7)   Please schedule a follow-up appointment in 6 months or sooner if needed Prescriptions: LIPITOR 20 MG TABS (ATORVASTATIN CALCIUM) 1po once daily  #90 x 3   Entered and Authorized by:   Corwin Levins MD   Signed by:   Corwin Levins MD on 06/30/2010   Method used:   Print then Give to Patient   RxID:   [id number]

## 2011-01-12 NOTE — Progress Notes (Signed)
Summary: Follow up   Phone Note Outgoing Call   Call placed by: Joselyn Glassman,  January 27, 2010 8:37 AM Call placed to: Patient Summary of Call: LM for pt to call me. I wanted to know how she was doing.  If the Questran didn't help of still having diarrhea, per Mike Gip PA-C she may need to be scheduled for a Flexible Sigmoidoscopy with Dr. Russella Dar. Initial call taken by: Joselyn Glassman,  January 27, 2010 8:37 AM  Follow-up for Phone Call        patient states that she is feeling better and the diarrhea is much improved she is increased her fluids. Yesterday she had checked her BP and it was very low 85/53 which is abnormal for her, she is feeling fine today. She states that she is going to follow up with Dr.John about her BP. She is taking the Questran as many days as she can, she only takes it when she is not working bc is makes her have gas. She states since she has been feeling so much better she would like to hold off of the flex sig and see how she does one more week on the questran. Follow-up by: Harlow Mares CMA Duncan Dull),  January 27, 2010 11:46 AM

## 2011-01-12 NOTE — Assessment & Plan Note (Signed)
Summary: STOMACH CRAMPS/#.CD   Vital Signs:  Patient profile:   55 year old female Height:      69 inches Weight:      143 pounds BMI:     21.19 O2 Sat:      98 % on Room air Temp:     98.9 degrees F oral Pulse rate:   88 / minute BP sitting:   116 / 80  (left arm) Cuff size:   regular  Vitals Entered ByMarland Kitchen Zella Ball Ewing (December 22, 2009 3:16 PM)  O2 Flow:  Room air CC: stomach cramps/RE   Primary Care Provider:  Corwin Levins MD  CC:  stomach cramps/RE.  History of Present Illness: here unfortunately with persistent crampy burning left sided abd pain with nausea and frequent diarrhea with numerous watery diarrhea/day if not taking the immodium;  simply no better with the flagyl; just feels bad overall and ongoing unusual fatigue and lower appetitie; may have lost several lbs but denies orthostasis , dizziness or falls ;  no rash or joint pains or blood or fever. Has not taken the crestor recently in the past few wks so it seems could not be related;  no other GI or GU complaints.    Problems Prior to Update: 1)  Uti  (ICD-599.0) 2)  Oth&unspec Noninfectious Gastroenteritis&colitis  (ICD-558.9) 3)  Sinusitis- Acute-nos  (ICD-461.9) 4)  Preventive Health Care  (ICD-V70.0) 5)  Osteopenia  (ICD-733.90) 6)  Fatigue  (ICD-780.79) 7)  Dyspnea  (ICD-786.05) 8)  Carpal Tunnel Syndrome, Left  (ICD-354.0) 9)  Preventive Health Care  (ICD-V70.0) 10)  Anemia-nos  (ICD-285.9) 11)  Allergic Rhinitis  (ICD-477.9) 12)  Hyperlipidemia  (ICD-272.4) 13)  Headache  (ICD-784.0) 14)  Anxiety  (ICD-300.00) 15)  Depression  (ICD-311) 16)  Fibromyalgia  (ICD-729.1) 17)  Hyperlipidemia, Familial  (ICD-272.0)  Medications Prior to Update: 1)  Darvocet-N 100 100-650 Mg Tabs (Propoxyphene N-Apap) .... Take 1 Tablet By Mouth Four Times A  Day As Needed Pain 2)  Sumatriptan Succinate 100 Mg Tabs (Sumatriptan Succinate) .[name].. 1 By Mouth Once Daily As Needed 3)  Crestor 10 Mg  Tabs (Rosuvastatin Calcium)  .... Take 1 Tablet By Mouth Once A Day 4)  Klonopin 0.5 Mg  Tabs (Clonazepam) .[name].. 1 Po in The Am and 1- 2  By Mouth At Bedtime As Needed 5)  Lyrica 50 Mg Caps (Pregabalin) .... 2 By Mouth Three Times A Day 6)  Cymbalta 60 Mg Cpep (Duloxetine Hcl) .[name].. 1 Tab Every Day 7)  Maxichlor Peh Dm 09-16-19 Mg Tabs (Phenylephrine-Chlorphen-Dm) .[name].. 1-2 By Mouth Q 6 Hours As Needed For Cough and Congestion 8)  Zolpidem Tartrate 10 Mg Tabs (Zolpidem Tartrate) .[name].. 1po At Bedtime As Needed 9)  Topamax 25 Mg Tabs (Topiramate) .... 3 By Mouth At Bedtime 10)  Robaxin 500 Mg Tabs (Methocarbamol) .[name].. 1 By Mouth As Needed 11)  Ondansetron 4 Mg Tbdp (Ondansetron) .[name].. 1 By Mouth Q 6 Hrs As Needed 12)  Metronidazole 250 Mg Tabs (Metronidazole) .[name].. 1po Four Times Per Day  Current Medications (verified): 1)  Sumatriptan Succinate 100 Mg Tabs (Sumatriptan Succinate) .[name].. 1 By Mouth Once Daily As Needed 2)  Klonopin 0.5 Mg  Tabs (Clonazepam) .[name].. 1 Po in The Am and 1- 2  By Mouth At Bedtime As Needed 3)  Lyrica 50 Mg Caps (Pregabalin) .... 2 By Mouth Three Times A Day 4)  Cymbalta 60 Mg Cpep (Duloxetine Hcl) .[name].. 1 Tab Every Day 5)  Maxichlor Peh  Dm 09-16-19 Mg Tabs (Phenylephrine-Chlorphen-Dm) .[name].. 1-2 By Mouth Q 6 Hours As Needed For Cough and Congestion 6)  Zolpidem Tartrate 10 Mg Tabs (Zolpidem Tartrate) .[name].. 1po At Bedtime As Needed 7)  Topamax 25 Mg Tabs (Topiramate) .... 3 By Mouth At Bedtime 8)  Robaxin 500 Mg Tabs (Methocarbamol) .[name].. 1 By Mouth As Needed 9)  Ondansetron 4 Mg Tbdp (Ondansetron) .[name].. 1 By Mouth Q 6 Hrs As Needed 10)  Prednisone 10 Mg Tabs (Prednisone) .... 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   Head:  normocephalic and atraumatic.   Eyes:  vision grossly intact, pupils equal, and pupils round.   Ears:  R ear normal and L ear normal.   Nose:  no external deformity and no nasal discharge.   Mouth:  no gingival abnormalities and pharynx pink and moist.   Neck:  supple and no masses.   Lungs:  normal respiratory effort and normal breath sounds.   Heart:  normal rate and regular rhythm.   Abdomen:  soft and normal bowel sounds.  but tender left abd upper, mid and lower areas without distension, guarding or rebound Extremities:  no edema, no erythema    Impression & Recommendations:  Problem # 1:  OTH&UNSPEC NONINFECTIOUS GASTROENTERITIS&COLITIS (ICD-558.9)  Her updated medication list for this problem includes:    Ondansetron 4 Mg Tbdp (Ondansetron) .[name]... 1 by mouth q 6 hrs as needed  Orders: Gastroenterology Referral (GI) recnet w/u neg, and afebrile and wbc normal, to try trial of prednisone, refer GI  Problem # 2:  HYPERLIPIDEMIA (ICD-272.4)  The following medications were removed from the medication list:    Crestor 10 Mg Tabs (Rosuvastatin calcium) .[name]... Take 1 tablet by mouth once a day ok for off the statin for now  Complete Medication List: 1)  Sumatriptan Succinate 100 Mg Tabs (Sumatriptan succinate) .[name].. 1 by mouth once daily as needed 2)  Klonopin 0.5 Mg Tabs (Clonazepam) .[name].. 1 po in the am and 1- 2  by mouth at bedtime as needed 3)  Lyrica 50 Mg Caps (Pregabalin) .... 2 by mouth three times a day 4)  Cymbalta 60 Mg Cpep (Duloxetine hcl) .[name].. 1 tab every day 5)   Maxichlor Peh Dm 09-16-19 Mg Tabs (Phenylephrine-chlorphen-dm) .[name].. 1-2 by mouth q 6 hours as needed for cough and congestion 6)  Zolpidem Tartrate 10 Mg Tabs (Zolpidem tartrate) .[name].. 1po at bedtime as needed 7)  Topamax 25 Mg Tabs (Topiramate) .... 3 by mouth at bedtime 8)  Robaxin 500 Mg Tabs (Methocarbamol) .[name].. 1 by mouth as needed 9)  Ondansetron 4 Mg Tbdp (Ondansetron) .[name].. 1 by mouth q 6 hrs as needed 10)  Prednisone 10 Mg Tabs (Prednisone) .... 4po qd for 3days, then 3po qd for 3days, then 2po qd for 3days, then 1po qd for 3 days, then stop  Patient Instructions: 1)  Please take all new  medications as prescribed 2)  Continue all previous medications as before this visit , except for the crestor 3)  You will be contacted about the referral(s) to: GI 4)  Please schedule a follow-up appointment as you already have planned Prescriptions: PREDNISONE 10 MG TABS (PREDNISONE) 4po qd for 3days, then 3po qd for 3days, then 2po qd for 3days, then 1po qd for 3 days, then stop  #30 x 0   Entered and Authorized by:   Corwin Levins MD   Signed by:   Corwin Levins MD on 12/22/2009   Method used:   Electronically to        [facility] [phone][facility] (retail)       [address]       [place], [place]  [address]       Ph: [phone] or [phone]       Fax: [phone]   RxID:   [id number] PREDNISONE 10 MG TABS (PREDNISONE) 4po qd for 3days, then 3po qd for 3days, then 2po qd for 3days, then 1po qd for 3 days, then stop  #30 x 0   Entered and Authorized by:   Corwin Levins MD   Signed by:   Corwin Levins MD on 12/22/2009   Method used:   Print then Give to Patient   RxID:   [id number]

## 2011-01-12 NOTE — Assessment & Plan Note (Signed)
Summary: Gastroenterology  [place]   MR#:  [id number] Page #  NAME:  Christina Waller, Christina Waller    OFFICE NO:  [id number]  DATE:  09/29/04  DOB:  02/12/2056  REFERRING PHYSICIAN:  Dr. Oliver Barre.   REASON FOR REFERRAL:  Chronic diarrhea, hematochezia, and lower abdominal pain.   HISTORY OF PRESENT ILLNESS:  The patient is a 55 year old white female referred through the courtesy of Dr. Oliver Barre.  She relates a history of episodic diarrhea and lower abdominal pain since childhood.  Over the past few years, her symptoms have slightly worsened, and she has noted the onset of fatigue with her symptoms that has been concerning to her.  She occasionally sees small amounts of bright red blood per rectum.  She frequently uses Imodium during her diarrhea flares, and this will often lead to several days of constipation.  She does have abdominal bloating and increased gas.  She relates episodic nausea that happens maybe once every 2-3 months, and she has episodic heartburn, which occurs on the order of monthly.  Her mother has a history of irritable bowel syndrome and complicated diverticulitis requiring surgical resection.  There is no family history of colon cancer, colon polyps, or inflammatory bowel disease.  She has not previously had colonoscopy, sigmoidoscopy, or barium enema.  She notes no recent travel, and she has not had any recent antibiotic usage or recent medication changes.  Her appetite is good, and her weight has been stable.  Last blood work reviewed from the chart was from January 2005, which revealed an elevated white blood cell count at 13.6, a normal hemoglobin, normal C-MET, and a normal TSH.    CURRENT MEDICATIONS:  Listed on the chart - updated and reviewed.  MEDICATION ALLERGIES:  None known.   PAST MEDICAL HISTORY:  Hyperlipidemia, depression, anxiety, allergies, migraine headaches, snoring, restless leg syndrome, status post bilateral tubal ligation.   SOCIAL HISTORY:  She is married with 3 children.   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  She drinks 1-2 alcoholic beverages a week, if at all.  She denies any tobacco product usage.   REVIEW OF SYSTEMS:  Reveals multiple areas positive as per the handwritten form.  PHYSICAL EXAMINATION:  No acute distress.  Height 5 feet 9 inches.  Weight 145 pounds.  Blood pressure is 100/60.  Pulse is 68 and regular.  HEENT exam:  Anicteric sclerae; oropharynx clear.  Chest:  Clear to auscultation and percussion.  Cardiac:  Regular rate and rhythm without murmurs.  Abdomen:  Soft, nontender and nondistended with normoactive bowel sounds; no palpable organomegaly, masses or hernias.  Rectal examination deferred to time of colonoscopy.  Extremities:  Without clubbing, cyanosis, or edema.  Neurologic:  Grossly nonfocal, alert and oriented x3.  ASSESSMENT AND PLAN:  Chronic episodic diarrhea and lower abdominal pain associated with small volume hematochezia.  Rule out inflammatory bowel disease, colorectal neoplasms, hemorrhoids, and irritable bowel syndrome.  Risks, benefits, and alternatives to colonoscopy with possible biopsy and possible polypectomy discussed with the patient and she consents to proceed.  This will be scheduled electively.  She is cautioned to minimize the use of Imodium to help avoid rebound constipation symptoms.       Venita Lick. Russella Dar, M.D., F.A.C.G.  [id number] cc:  Dr. Oliver Barre  D:  09/29/04; T:  ; Job [id number]

## 2011-01-12 NOTE — Miscellaneous (Signed)
Summary: RX Questran  Clinical Lists Changes  Medications: Added new medication of QUESTRAN 4 GM PACK (CHOLESTYRAMINE) Take 4 grams in water or juice in the AM.  Take 2 hours away from the Vancomycin. - Signed Rx of QUESTRAN 4 GM PACK (CHOLESTYRAMINE) Take 4 grams in water or juice in the AM.  Take 2 hours away from the Vancomycin.;  #14 packs x 0;  Signed;  Entered by: Lowry Ram NCMA;  Authorized by: Sammuel Cooper PA-c;  Method used: [facility] to [facility] [phone]*, [address] [place], [place]  [address], Ph: [phone] or [phone], Fax: [phone]    Prescriptions: QUESTRAN 4 GM PACK (CHOLESTYRAMINE) Take 4 grams in water or juice in the AM.  Take 2 hours away from the Vancomycin.  #14 packs x 0   Entered by:   Lowry Ram NCMA   Authorized by:   Sammuel Cooper PA-c   Signed by:   Lowry Ram NCMA on 01/20/2010   Method used:   Electronically to        [facility] [phone]* (retail)       [address]       [place], [place]  [address]       Ph: [phone] or [phone]       Fax: [phone]   RxID:   [id number]

## 2011-01-12 NOTE — Procedures (Signed)
Summary: Flexible Sigmoidoscopy  Patient: Alyssamarie Debell Note: All result statuses are Final unless otherwise noted.  Tests: (1) Flexible Sigmoidoscopy (FLX)  FLX Flexible Sigmoidoscopy                             DONE     [facility]     [address]     [place], [place]  [address]           FLEXIBLE SIGMOIDOSCOPY PROCEDURE REPORT           PATIENT:  Buffie, Herne  MR#:  [id number]     BIRTHDATE:  Feb 02, 1956, 53 yrs. old  GENDER:  female           ENDOSCOPIST:  Judie Petit T. Russella Dar, MD, [facility]           PROCEDURE DATE:  02/12/2010     PROCEDURE:  Flexible Sigmoidoscopy with biopsy     ASA CLASS:  Class II     INDICATIONS:  hematochezia, diarrhea           MEDICATIONS:   Fentanyl 75 mcg IV, Versed 7 mg IV           DESCRIPTION OF PROCEDURE:   After the risks benefits and     alternatives of the procedure were thoroughly explained, informed     consent was obtained.  Digital rectal exam was performed and     revealed no abnormalities.   The [id number] endoscope     was introduced through the anus and advanced to the descending     colon, without limitations.  The quality of the prep was good.     The instrument was then slowly withdrawn as the mucosa was fully     examined.     <<PROCEDUREIMAGES>>           Colitis was found rectum to descending colon. It was erythematous     and patchy. No pseudomembranes. Multiple biopsies were obtained     and sent to pathology. Retroflexed views in the rectum revealed no     abnormalities.  The scope was then withdrawn from the patient and     the procedure completed.           COMPLICATIONS:  None           ENDOSCOPIC IMPRESSION:     1) Colitis, nonspecific appearance, left sided           RECOMMENDATIONS:     1) await biopsy results     2) follow-up: GI clinic 2 weeks     3) 4 week course of vancomycin po at 125 mg qid, then bid for 2     week, then qd for 1 week, then qod for 1 week           Pearlena Ow T. Russella Dar,  MD, [facility]           CC:  Corwin Levins, MD           n.     Rosalie DoctorVenita Lick. Mickel Schreur at 02/12/2010 09:10 AM           [place], [place], [phone]  Note: An exclamation mark (!) indicates a result that was not dispersed into the flowsheet. Document Creation Date: 02/12/2010 9:10 AM _______________________________________________________________________  (1) Order result status: Final Collection or observation date-time: 02/12/2010 09:02 Requested date-time:  Receipt date-time:  Reported date-time:  Referring Physician:  Ordering Physician: Claudette Head [phone]) Specimen Source:  Source: Launa Grill Order Number: [id number] Lab site:

## 2011-01-12 NOTE — Progress Notes (Signed)
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 Finished all the antibiotics Dr Jonny Ruiz ordered for her and is still not feeling well. Wonders if there is any way we can work her in sooner than 01-30-10 for stomach cramps. Initial call taken by: Leanor Kail [facility],  January 02, 2010 11:47 AM  Follow-up for Phone Call        Pt. c/o constant lower abd. pain for 1 month, getting worse.  Stools are very loose-has 3-5 daily, some blood on tissue 2-3x daily, Occ. nausea. Denies constipation, black stools, fever. Pt. can only come for appt. on Fridays.  1) See Willette Cluster NP on 01-09-10 at 10:30am 2) Continue care w/Dr.John until we can see you 3) If symptoms become worse call back immediately or go to ER.   Follow-up by: Laureen Ochs LPN,  January 02, 2010 11:56 AM  Additional Follow-up for Phone Call Additional follow up Details #1::        Agree Additional Follow-up by: Meryl Dare MD [facility],  January 02, 2010 12:14 PM

## 2011-01-12 NOTE — Progress Notes (Signed)
Summary: Progress report   Phone Note Call from Patient   Caller: Patient Call For: Lowry Ram CMA Summary of Call: Pt called to tell me she has no pain but she is still have very frequent loose small stools.  She went 6-7 times in a 1-2 hour span yesterday and today.  Pt also having burning in her stomach.  She is not as light headed as she was. BP still low.  I called Gunnar Fusi and asked her and she said that pt should come tomorrow to see Dr Russella Dar since he has 3 openings.  The pt insisted she cannot get off work. She is working with a Patent examiner and cannot get off.  I spoke to [employer] PA-C today and she suggested I call in Questran 4 grams in juice or water 2 hours away from the Vancomycin.  She also needs to come to the lab this week and repeat the C-Diff stool study. I called the pt on her cell number [phone] and left all this info on her voice mail.  She asked me to do that if I had to call her back.  I am to call her on Mon and get a progress report . If she is no better she needs to be scheduled for a Flex Sig with Dr. Fidela Juneau per Amy.  Gunnar Fusi also suggested that. Initial call taken by: Joselyn Glassman,  January 20, 2010 4:22 PM

## 2011-01-12 NOTE — Progress Notes (Signed)
Summary: referral  Phone Note Call from Patient Call back at Work Phone [phone]   Caller: Patient Summary of Call: pt called stating that she is still having abdominal pain and bloating. pt has appt scheduled for today and would like to know if she should come in to see MD or have referral to GI? please advise Initial call taken by: Margaret Pyle, CMA,  December 22, 2009 1:13 PM  Follow-up for Phone Call        pt came in for OV with MD today Follow-up by: Margaret Pyle, CMA,  December 22, 2009 2:56 PM

## 2011-01-12 NOTE — Miscellaneous (Signed)
Summary: Orders Update  Clinical Lists Changes  Orders: Added new Referral order of Misc. Referral (Misc. Ref) - Signed 

## 2011-01-12 NOTE — Assessment & Plan Note (Signed)
Summary: F/U Gastritis, saw Willette Cluster RNP   History of Present Illness Visit Type: Follow-up Visit Primary GI MD: Elie Goody MD [facility] Primary Provider: Oliver Barre, MD Requesting Provider: n/a Chief Complaint: F/u for office visit with Gunnar Fusi for diarrhea, abd cramping, and fevers. Pt states that she is much better and denies any GI complaints  History of Present Illness:   This is a return office as it for presumed relapsing C. difficile diarrhea. Flexible sigmoidoscopy was unremarkable. Repeat C. difficile toxin assays were negative. She is still having up to 4 loose bowel movements each day which is substantially improved compared to her recent diarrhea when she was having 12 loose bowel movements a day. She has not filled her most recent vancomycin prescription. She has completed two 14 days courses of vancomycin and one 10 day course of metronidazole.   GI Review of Systems      Denies abdominal pain, acid reflux, belching, bloating, chest pain, dysphagia with liquids, dysphagia with solids, heartburn, loss of appetite, nausea, vomiting, vomiting blood, weight loss, and  weight gain.        Denies anal fissure, black tarry stools, change in bowel habit, constipation, diarrhea, diverticulosis, fecal incontinence, heme positive stool, hemorrhoids, irritable bowel syndrome, jaundice, light color stool, liver problems, rectal bleeding, and  rectal pain.   Current Medications (verified): 1)  Sumatriptan Succinate 100 Mg Tabs (Sumatriptan Succinate) .[name].. 1 By Mouth Once Daily As Needed 2)  Klonopin 0.5 Mg  Tabs (Clonazepam) .[name].. 1 Po in The Am and 1- 2  By Mouth At Bedtime As Needed 3)  Lyrica 50 Mg Caps (Pregabalin) .... 2 By Mouth Three Times A Day 4)  Cymbalta 30 Mg Cpep (Duloxetine Hcl) .... Takes 90mg  Daily 5)  Zolpidem Tartrate 10 Mg Tabs (Zolpidem Tartrate) .[name].. 1po At Bedtime As Needed 6)  Topamax 100 Mg Tabs (Topiramate) .... One Tablet By Mouth Once Daily At Bedtime 7)   Robaxin 500 Mg Tabs (Methocarbamol) .[name].. 1 By Mouth As Needed 8)  Wellbutrin Xl 150 Mg Xr24h-Tab (Bupropion Hcl) .... One Tablet By Mouth Once Daily  Allergies (verified): No Known Drug Allergies  Past History:  Past Medical History: Reviewed history from 01/09/2010 and no changes required. Depression Anxiety Headache Restless leg syndrome Hyperlipidemia migraine fibromyalgia Allergic rhinitis Anemia-NOS Osteopenia Vit D deficiency  Past Surgical History: Reviewed history from 10/31/2007 and no changes required. Tubal ligation-bilat  Family History: Reviewed history from 01/09/2010 and no changes required. grandfather with heart disease ulcer disease arthritis depression HTN granmother with osteoporosis No FH of Colon Cancer:  Social History: Reviewed history from 01/09/2010 and no changes required. 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 All other ROS were reviewed and were negative.   Vital Signs:  Patient profile:   55 year old female Height:      69 inches Weight:      137 pounds BMI:     20.30 BSA:     1.76 Pulse rate:   88 / minute Pulse rhythm:   regular BP sitting:   100 / 68  (left arm) Cuff size:   regular  Vitals Entered By: Ok Anis CMA (March 06, 2010 11:22 AM)  Physical Exam  General:  Well developed, well nourished, no acute distress. Head:  Normocephalic and  atraumatic. Eyes:  PERRLA, no icterus. Mouth:  No deformity or lesions, dentition normal. Lungs:  Clear throughout to auscultation. Heart:  Regular rate and rhythm; no murmurs, rubs,  or bruits. Abdomen:  Soft, nontender and nondistended. No masses, hepatosplenomegaly or hernias noted. Normal bowel sounds. Psych:  Alert and cooperative. Normal mood and affect.  Impression &  Recommendations:  Problem # 1:  DIARRHEA-PRESUMED INFECTIOUS (ICD-009.3) Presumed recurrent C. difficile diarrhea. She is advised to fill her vancomycin prescription and complete the pulse-taper vancomycin regimen completely as prescribed. Florastor one b.i.d. until she completes vancomycin.  Patient Instructions: 1)  Pick up Vancomycin at your pharmacy. 2)  Start Florastor one tablet by mouth two times a day until Vancomycin completed.  3)  Please schedule a follow-up appointment as needed.  4)  Copy sent to : Oliver Barre, MD 5)  The medication list was reviewed and reconciled.  All changed / newly prescribed medications were explained.  A complete medication list was provided to the patient / caregiver.

## 2011-01-12 NOTE — Letter (Signed)
Summary: New Patient letter  [facility]  [address] [place], [place] [address]   Phone: [phone]  Fax: [phone]       12/22/2009 MRN: [id number]  [facility] [address] [place], [place]  [address]  Dear Ms. Platt,  Welcome to the Gastroenterology Division at [facility].    You are scheduled to see Dr.  Russella Dar on 01-14-10 at 1:45PM on the 3rd floor at [facility], [address]  We ask that you try to arrive at our office 15 minutes prior to your appointment time to allow for check-in.  We would like you to complete the enclosed self-administered evaluation form prior to your visit and bring it with you on the day of your appointment.  We will review it with you.  Also, please bring a complete list of all your medications or, if you prefer, bring the medication bottles and we will list them.  Please bring your insurance card so that we may make a copy of it.  If your insurance requires a referral to see a specialist, please bring your referral form from your primary care physician.  Co-payments are due at the time of your visit and may be paid by cash, check or credit card.     Your office visit will consist of a consult with your physician (includes a physical exam), any laboratory testing he/she may order, scheduling of any necessary diagnostic testing (e.g. x-ray, ultrasound, CT-scan), and scheduling of a procedure (e.g. Endoscopy, Colonoscopy) if required.  Please allow enough time on your schedule to allow for any/all of these possibilities.    If you cannot keep your appointment, please call [phone] to cancel or reschedule prior to your appointment date.  This allows [place] the opportunity to schedule an appointment for another patient in need of care.  If you do not cancel or reschedule by 5 p.m. the business day prior to your appointment date, you will be charged a $50.00 late cancellation/no-show fee.    Thank you for choosing  [facility] for your medical needs.  We appreciate the opportunity to care for you.  Please visit [place] at our website  to learn more about our practice.                     Sincerely,                                                             The Gastroenterology Division

## 2011-01-12 NOTE — Progress Notes (Signed)
  Phone Note Refill Request Message from:  Fax from Pharmacy on July 27, 2010 11:44 AM  Refills Requested: Medication #1:  LIPITOR 20 MG TABS 1po once daily.   Dosage confirmed as above?Dosage Confirmed   Notes: [facility] Initial call taken by: Robin Ewing CMA (AAMA),  July 27, 2010 11:45 AM    Prescriptions: LIPITOR 20 MG TABS (ATORVASTATIN CALCIUM) 1po once daily  #90 x 3   Entered by:   Scharlene Gloss CMA (AAMA)   Authorized by:   Corwin Levins MD   Signed by:   Scharlene Gloss CMA (AAMA) on 07/27/2010   Method used:   Faxed to ...       [facility] (mail-order)             , [place]         Ph: [phone]       Fax: [phone]   RxID:   [id number]

## 2011-01-12 NOTE — Letter (Signed)
Summary: Patient Notice- Colon Biospy Results  [facility]  [address] [place], [place] [address]   Phone: [phone]  Fax: [phone]        February 13, 2010 MRN: [id number]    [facility] [address] [place], [place]  [address]    Dear Ms. Virani,  I am pleased to inform you that the biopsies taken during your recent sigmoidoscopy did not show any evidence of cancer upon pathologic examination. The biopsies were normal.  Continue with the treatment plan as outlined on the day of your      exam.  Please call us if you are having persistent problems or have questions about your condition that have not been fully answered at this time.  Sincerely,  Meryl Dare MD [facility]  This letter has been electronically signed by your physician.  Appended Document: Patient Notice- Colon Biospy Results Letter mailed 3.8.11

## 2011-01-12 NOTE — Miscellaneous (Signed)
Summary: vancomycin order  Clinical Lists Changes  Medications: Added new medication of VANCOCIN HCL 125 MG  CAPS (VANCOMYCIN HCL) qidx4 weeks,then two times a day x 2 weeks then daily  x 1 week , then every other day x 1 week - Signed Rx of VANCOCIN HCL 125 MG  CAPS (VANCOMYCIN HCL) qidx4 weeks,then two times a day x 2 weeks then daily  x 1 week , then every other day x 1 week;  #151 x 0;  Signed;  Entered by: Alease Frame RN;  Authorized by: Meryl Dare MD [facility];  Method used: Electronically to [id number] [address], [place], [place]  [address], Ph: [phone] or [phone], Fax: [phone] Observations: Added new observation of ALLERGY REV: Done (02/12/2010 9:13) Added new observation of NKA: T (02/12/2010 9:13)    Prescriptions: VANCOCIN HCL 125 MG  CAPS (VANCOMYCIN HCL) qidx4 weeks,then two times a day x 2 weeks then daily  x 1 week , then every other day x 1 week  #151 x 0   Entered by:   Alease Frame RN   Authorized by:   Meryl Dare MD [facility]   Signed by:   Alease Frame RN on 02/12/2010   Method used:   [facility]ronically to        [facility] [phone][facility] (retail)       [address]       [place], [place]  [address]       Ph: [phone] or [phone]       Fax: [phone]   RxID:   [id number]

## 2011-01-14 NOTE — Assessment & Plan Note (Signed)
Summary: 6 MTH FU---STC   Vital Signs:  Patient profile:   55 year old female Height:      69 inches Weight:      141.50 pounds BMI:     20.97 O2 Sat:      98 % on Room air Temp:     98.2 degrees F oral Pulse rate:   78 / minute BP sitting:   126 / 82  (left arm) Cuff size:   regular  Vitals Entered By: Zella Ball Ewing CMA (AAMA) (December 18, 2010 1:40 PM)  O2 Flow:  Room air  CC: 6 month followup/RE   Primary Care Provider:  Oliver Barre, MD  CC:  6 month followup/RE.  History of Present Illness: here to f/u; overall doing well.  Pt denies CP, worsening sob, doe, wheezing, orthopnea, pnd, worsening LE edema, palps, dizziness or syncope  Pt denies new neuro symptoms such as headache, facial or extremity weakness  Pt denies polydipsia, polyuria   Overall good compliance with meds, trying to follow low chol  diet, wt stable, little excercise however .  Denies worsening depressive symptoms, suicidal ideation, or panic.  though ongoing anxiety stable  , and still seeing dr Evelene Croon for chronic depression.  Still separated after about 2.5 yrs but she is not complainaing as she has 2 jobs but benefits through the husband.   No orthopedic or rheum symptoms - overall FMS symptoms much improved recetnly.  Has some mild allergy nasal symtpoms with clear drainage, and no fever , pain, ST or earache.    Problems Prior to Update: 1)  Hepatotoxicity, Drug-induced, Risk of  (ICD-V58.69) 2)  Chest Pain  (ICD-786.50) 3)  Orthostatic Dizziness  (ICD-780.4) 4)  Unspecified Hypotension  (ICD-458.9) 5)  Hypokalemia  (ICD-276.8) 6)  Hemorrhoids-external  (ICD-455.3) 7)  Abdominal Pain-multiple Sites  (ICD-789.09) 8)  Diarrhea-presumed Infectious  (ICD-009.3) 9)  Rectal Bleeding  (ICD-569.3) 10)  Uti  (ICD-599.0) 11)  Oth&unspec Noninfectious Gastroenteritis&colitis  (ICD-558.9) 12)  Sinusitis- Acute-nos  (ICD-461.9) 13)  Preventive Health Care  (ICD-V70.0) 14)  Osteopenia  (ICD-733.90) 15)  Fatigue   (ICD-780.79) 16)  Dyspnea  (ICD-786.05) 17)  Carpal Tunnel Syndrome, Left  (ICD-354.0) 18)  Preventive Health Care  (ICD-V70.0) 19)  Anemia-nos  (ICD-285.9) 20)  Allergic Rhinitis  (ICD-477.9) 21)  Hyperlipidemia  (ICD-272.4) 22)  Headache  (ICD-784.0) 23)  Anxiety  (ICD-300.00) 24)  Depression  (ICD-311) 25)  Fibromyalgia  (ICD-729.1) 26)  Hyperlipidemia, Familial  (ICD-272.0)  Medications Prior to Update: 1)  Sumatriptan Succinate 100 Mg Tabs (Sumatriptan Succinate) .[name].. 1 By Mouth Once Daily As Needed 2)  Klonopin 0.5 Mg  Tabs (Clonazepam) .[name].. 1 Po in The Am and 1- 2  By Mouth At Bedtime As Needed 3)  Lyrica 50 Mg Caps (Pregabalin) .... 2 By Mouth Three Times A Day 4)  Cymbalta 30 Mg Cpep (Duloxetine Hcl) .... Takes 90mg  Daily 5)  Zolpidem Tartrate 10 Mg Tabs (Zolpidem Tartrate) .[name].. 1po At Bedtime As Needed 6)  Topamax 100 Mg Tabs (Topiramate) .... One Tablet By Mouth Once Daily At Bedtime 7)  Robaxin 500 Mg Tabs (Methocarbamol) .[name].. 1 By Mouth As Needed 8)  Wellbutrin Xl 150 Mg Xr24h-Tab (Bupropion Hcl) .... One Tablet By Mouth Once Daily 9)  Dicyclomine Hcl 10 Mg Caps (Dicyclomine Hcl) .... Take 1 Tab Twice Daily 10)  Lipitor 20 Mg Tabs (Atorvastatin Calcium) .[name].. 1po Once Daily  Current Medications (verified): 1)  Sumatriptan Succinate 100 Mg Tabs (Sumatriptan Succinate) .Marland KitchenMarland KitchenMarland Kitchen  1 By Mouth Once Daily As Needed 2)  Klonopin 0.5 Mg  Tabs (Clonazepam) .[name].. 1 Po in The Am and 1- 2  By Mouth At Bedtime As Needed 3)  Lyrica 50 Mg Caps (Pregabalin) .... 2 By Mouth Three Times A Day 4)  Cymbalta 30 Mg Cpep (Duloxetine Hcl) .... Takes 90mg  Daily 5)  Zolpidem Tartrate 10 Mg Tabs (Zolpidem Tartrate) .[name].. 1po At Bedtime As Needed 6)  Topamax 100 Mg Tabs (Topiramate) .... One Tablet By Mouth Once Daily At Bedtime 7)  Robaxin 500 Mg Tabs (Methocarbamol) .[name].. 1 By Mouth As Needed 8)  Wellbutrin Xl 150 Mg Xr24h-Tab (Bupropion Hcl) .... One Tablet By Mouth Once Daily 9)  Lipitor 40 Mg Tabs  (Atorvastatin Calcium) .[name].. 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   Head:  normocephalic and atraumatic.   Eyes:  vision grossly intact, pupils equal, and pupils round.   Ears:  R ear normal and L ear normal.   Nose:  nasal dischargemucosal pallor and mucosal edema.   Mouth:  pharyngeal erythema and fair dentition.   Neck:  supple and no masses.   Lungs:  normal respiratory effort and normal breath sounds.   Heart:  normal rate and regular rhythm.   Msk:  no joint tenderness and no joint swelling., no signficant back tenderness Extremities:  no edema, no erythema  Neurologic:  strength normal in all extremities and gait normal.     Impression & Recommendations:  Problem # 1:  HYPERLIPIDEMIA (ICD-272.4)  Her updated medication list for this problem includes:    Lipitor 40 Mg Tabs (Atorvastatin calcium) .[name]... 1 by mouth once daily  Orders: TLB-Lipid Panel (80061-LIPID)  Labs Reviewed: SGOT: 14 (06/30/2010)   SGPT: 10 (06/30/2010)   HDL:65.50 (06/30/2010), 54.8 (08/04/2007)  LDL:DEL (08/04/2007), DEL (04/07/2007)  Chol:401 (06/30/2010), 393 (08/04/2007)  Trig:162.0  (06/30/2010), 409 (08/04/2007) stable overall by hx and exam, ok to continue meds/tx as is , Pt to continue diet efforts, good med tolerance; to check labs - goal LDL less than 100  Problem # 2:  ALLERGIC RHINITIS (ICD-477.9) mild, for OTC allegra as needed   Problem # 3:  FIBROMYALGIA (ICD-729.1)  Her updated medication list for this problem includes:    Robaxin 500 Mg Tabs (Methocarbamol) .[name]... 1 by mouth as needed stable overall by hx and exam, ok to continue meds/tx as is   Problem # 4:  ANXIETY (ICD-300.00)  Her updated medication list for this problem includes:    Klonopin 0.5 Mg Tabs (Clonazepam) .[name]... 1 po in the am and 1- 2  by mouth at bedtime as needed    Cymbalta 30 Mg Cpep (Duloxetine hcl) .[name]... Takes 90mg  daily    Wellbutrin Xl 150 Mg Xr24h-tab (Bupropion hcl) ..... One tablet by mouth once  daily  Discussed medication use and relaxation techniques.  stable overall by hx and exam, ok to continue meds/tx as is   Complete Medication List: 1)  Sumatriptan Succinate 100 Mg Tabs (Sumatriptan succinate) .[name].. 1 by mouth once daily as needed 2)  Klonopin 0.5 Mg Tabs (Clonazepam) .[name].. 1 po in the am and 1- 2  by mouth at bedtime as needed 3)  Lyrica 50 Mg Caps (Pregabalin) .... 2 by mouth three times a day 4)  Cymbalta 30 Mg Cpep (Duloxetine hcl) .... Takes 90mg  daily 5)  Zolpidem Tartrate 10 Mg Tabs (Zolpidem tartrate) .[name].. 1po at bedtime as needed 6)  Topamax 100 Mg Tabs (Topiramate) .... One tablet by mouth once daily at bedtime 7)  Robaxin 500 Mg Tabs (Methocarbamol) .[name].. 1 by mouth as needed 8)  Wellbutrin Xl 150 Mg Xr24h-tab (Bupropion hcl) .... One tablet by mouth once daily 9)  Lipitor 40 Mg Tabs (Atorvastatin calcium) .[name].. 1 by mouth once daily  Other Orders: TLB-Hepatic/Liver Function Pnl (80076-HEPATIC)  Patient Instructions: 1)  Continue all previous medications as before this visit 2)  Please go to the Lab in the basement for your blood and/or urine tests  today 3)  Please call the number on the [facility] Card for results of your testing 4)  Please schedule a follow-up appointment in 6 months for CPX with labs Prescriptions: SUMATRIPTAN SUCCINATE 100 MG TABS (SUMATRIPTAN SUCCINATE) 1 by mouth once daily as needed  #9 x 11   Entered and Authorized by:   Corwin Levins MD   Signed by:   Corwin Levins MD on 12/18/2010   Method used:   Print then Give to Patient   RxID:   [id number] ZOLPIDEM TARTRATE 10 MG TABS (ZOLPIDEM TARTRATE) 1po at bedtime as needed  #90 x 1   Entered and Authorized by:   Corwin Levins MD   Signed by:   Corwin Levins MD on 12/18/2010   Method used:   Print then Give to Patient   RxID:   [id number] LYRICA 50 MG CAPS (PREGABALIN) 2 by mouth three times a day  #180 x 5   Entered and Authorized by:   Corwin Levins MD   Signed by:   Corwin Levins MD on 12/18/2010   Method used:   Print then Give to Patient   RxID:   [id number] KLONOPIN 0.5 MG  TABS (CLONAZEPAM) 1 po in the AM and 1- 2  by mouth at bedtime as needed  #60 x 2   Entered and Authorized by:   Corwin Levins MD   Signed by:   Corwin Levins MD on 12/18/2010   Method used:   Print then Give to Patient   RxID:   [id number] SUMATRIPTAN SUCCINATE 100 MG TABS (SUMATRIPTAN SUCCINATE) 1 by mouth once daily as needed  #9 x 11   Entered and Authorized by:   Corwin Levins MD   Signed by:   Corwin Levins MD on 12/18/2010   Method used:   Print then Give to Patient   RxID:   [id number]    Orders Added: 1)  TLB-Lipid Panel [80061-LIPID] 2)  TLB-Hepatic/Liver Function Pnl [80076-HEPATIC] 3)  Est. Patient Level IV [51884]

## 2011-01-14 NOTE — Progress Notes (Signed)
Summary: PA-Sumatriptan  Phone Note From Pharmacy   Summary of Call: PA-Sumatriptan, called [employer] @ [phone], ref# [id number], awaiting form. Dagoberto Reef  January 05, 2011 3:39 PM Form  to Dr Jonny Ruiz to complete. Dagoberto Reef  January 05, 2011 3:43 PM PA Faxed to [facility] @ [phone], awaiting approval Dagoberto Reef  January 06, 2011 4:44 PM PA-approved 01/05/11 thur 09/30/13  pt aware. Initial call taken by: Dagoberto Reef,  January 07, 2011 2:44 PM

## 2011-01-15 NOTE — Letter (Signed)
Summary: [facility]  [facility]   Imported By: Sherian Rein 06/08/2010 10:44:14  _____________________________________________________________________  External Attachment:    Type:   Image     Comment:   External Document

## 2011-01-20 NOTE — Medication Information (Signed)
Summary: Authorization/[employer]  Authorization/[employer]   Imported By: Lester  01/13/2011 10:14:58  _____________________________________________________________________  External Attachment:    Type:   Image     Comment:   External Document

## 2011-04-27 NOTE — Assessment & Plan Note (Signed)
[facility]                               LIPID CLINIC NOTE   NAME:Christina Waller, Christina Waller                       MRN:          [id number]  DATE:09/28/2007                            DOB:          09-Jun-1956    REASON FOR VISIT:  First office visit for lipid clinic.   REFERRING PHYSICIAN:  Primary physician, Dr. Jonny Ruiz.   PAST MEDICAL HISTORY:  1. Familial hyperlipidemia.  Positive family history for      hyperlipidemia but not coronary artery disease.  2. Depression.  3. Fibromyalgia.   MEDICATIONS:  1. Multivitamin daily.  2. Klonopin 1.25 mg at bedtime.  3. Crestor 10 mg daily.  4. Darvocet as needed.  5. Zanaflex 1 tablet at bedtime.  6. Effexor 225 mg daily.  7. Lyrica 50 mg three times daily.   PHYSICAL EXAMINATION:  VITAL SIGNS:  Weight 150 pounds.  Blood pressure  110/70. Heart rate 90.   LABORATORY DATA:  Liver function tests within normal limits.  Total  cholesterol 393.  Triglycerides 409.  LDL 271.  HDL 55.  This is off of  Crestor.   ASSESSMENT:  Christina Waller is a very pleasant 55 year old woman who comes  to the Lipid Clinic today referred by her primary physician, Dr. Jonny Ruiz.  She has been treated for hyperlipidemia for several years, although she  states that after being on Zocor 80 mg for years, it did not bring her  LDL to goal.  She is a healthy young woman who had previously been  exercising on a regular basis, however, over the last year she has been  lax about her exercise and walks about 30 minutes twice a week.  She  eats fairly low fat, low carbohydrate meals, eats raisin bran or oatmeal  for breakfast with coffee and orange juice, does not snack very much  during the day, occasionally on cookies or ice cream but only when she  makes them on special occasions.  She eats a low fat, low carbohydrate  lunch and an evening meal of lean protein, vegetable and a small amount  of carbohydrates.  She has lots of fruits and vegetables  throughout the  day.  Previously noted lipid panel is when she was off of Crestor.  Total cholesterol above goal of less than 200, triglycerides above goal  of less than 150, HDL at goal of greater than 40 and LDL greater than  goal of less than 130.  She is currently compliant with Crestor 10 mg  daily.  Her liver function tests are within normal limits and she does  not complain of any myalgias or problems with the Crestor.  She does  have fibromyalgia that is currently under control, however, she has a  bone spur on her left shoulder that is causing her severe pain and she  is having surgery in the next three weeks.  She is very willing to do  medication titration and wants to bring her lipid panel within goal.  She is frustrated over the several years that she has been trying to  treat  her hypercholesterolemia to no avail.   PLAN:  1. Continue Crestor 10 mg daily.  2. Continue low fat, low carbohydrate diet.  3. Begin exercise regimen of at least 30 minutes of walking three      times a week.  4. Followup visit in 8 weeks for lipid panel and liver function tests      to assess what her lipid panel is on Crestor and make adjustments      at that time.      Christina Waller, Christina Waller  Electronically Signed      Jesse Sans. Daleen Squibb, MD, [facility]  Electronically Signed   LC/MedQ  DD: 09/28/2007  DT: 09/29/2007  Job #: [id number]

## 2011-04-30 NOTE — Assessment & Plan Note (Signed)
[facility]                                 ON-CALL NOTE   NAME:POLLETDarielys, Giglia                         MRN:          [id number]  DATE:11/25/2006                            DOB:          26-Oct-1956    Phone number is [phone].   SUBJECTIVE:  Patient was seen today.  Medicines were not called in for  her.  She just left the office at [facility], saw Dr. Artist Pais, who was supposed to  have called in a different antibiotic.  Patient was seen 2 days ago by  Dr. Jonny Ruiz and had an antiobiotic called in which did not help her, which  she never got.  Tried to call the triage nurse multiple times to get the  antiobiotic squared away and get it called in which never happened.  So  she came in and saw a doctor again this afternoon.  When she did that  she thought that her cough medicine from Dr. Raphael Gibney visit has already  been called in but indeed it has not, and she is requesting cough  medication.  The patient is rather frustrated at this point with her  lack of calls.   OBJECTIVE:  Presumed bronchitis with overwhelming cough.   PLAN:  Called in prescription for Tussionex 1 teaspoon h.s. p.r.n. 4  ounces and no refills to [facility] on [address] at [phone].  Primary  care Kenyetta Wimbish normally is Dr. Jonny Ruiz, tonight was Dr. Artist Pais.  Office is  [place].     Arta Silence, MD  Electronically Signed    RNS/MedQ  DD: 11/26/2006  DT: 11/26/2006  Job #: [id number]

## 2011-06-25 ENCOUNTER — Other Ambulatory Visit: Payer: Self-pay

## 2011-06-25 ENCOUNTER — Other Ambulatory Visit: Payer: Self-pay | Admitting: Internal Medicine

## 2011-06-25 DIAGNOSIS — Z0000 Encounter for general adult medical examination without abnormal findings: Secondary | ICD-10-CM

## 2011-06-29 ENCOUNTER — Encounter: Payer: Self-pay | Admitting: Internal Medicine

## 2011-06-29 DIAGNOSIS — Z0000 Encounter for general adult medical examination without abnormal findings: Secondary | ICD-10-CM | POA: Insufficient documentation

## 2011-06-29 DIAGNOSIS — Z0001 Encounter for general adult medical examination with abnormal findings: Secondary | ICD-10-CM | POA: Insufficient documentation

## 2011-07-02 ENCOUNTER — Encounter: Payer: Self-pay | Admitting: Internal Medicine

## 2011-07-05 ENCOUNTER — Other Ambulatory Visit: Payer: Self-pay | Admitting: Internal Medicine

## 2011-08-26 ENCOUNTER — Other Ambulatory Visit: Payer: Self-pay

## 2011-08-26 ENCOUNTER — Other Ambulatory Visit: Payer: Self-pay | Admitting: Internal Medicine

## 2011-08-26 DIAGNOSIS — Z0000 Encounter for general adult medical examination without abnormal findings: Secondary | ICD-10-CM

## 2011-08-26 MED ORDER — PREGABALIN 50 MG PO CAPS: 100.0000 mg | ORAL_CAPSULE | Freq: Three times a day (TID) | ORAL | Status: DC

## 2011-08-27 ENCOUNTER — Other Ambulatory Visit (INDEPENDENT_AMBULATORY_CARE_PROVIDER_SITE_OTHER): Payer: Self-pay

## 2011-08-27 ENCOUNTER — Other Ambulatory Visit: Payer: Self-pay | Admitting: Internal Medicine

## 2011-08-27 ENCOUNTER — Other Ambulatory Visit: Payer: Self-pay

## 2011-08-27 DIAGNOSIS — Z0000 Encounter for general adult medical examination without abnormal findings: Secondary | ICD-10-CM

## 2011-08-27 LAB — CBC WITH DIFFERENTIAL/PLATELET
Basophils Absolute: 0 10*3/uL (ref 0.0–0.1)
Hemoglobin: 12.5 g/dL (ref 12.0–15.0)
Lymphocytes Relative: 30.2 % (ref 12.0–46.0)
Monocytes Relative: 7.6 % (ref 3.0–12.0)
Platelets: 203 10*3/uL (ref 150.0–400.0)
RDW: 13 % (ref 11.5–14.6)

## 2011-08-27 LAB — URINALYSIS, ROUTINE W REFLEX MICROSCOPIC
Leukocytes, UA: NEGATIVE
Nitrite: NEGATIVE
Total Protein, Urine: NEGATIVE
pH: 6.5 (ref 5.0–8.0)

## 2011-08-27 LAB — COMPREHENSIVE METABOLIC PANEL
ALT: 19 U/L (ref 0–35)
CO2: 27 mEq/L (ref 19–32)
Calcium: 9.3 mg/dL (ref 8.4–10.5)
Chloride: 109 mEq/L (ref 96–112)
GFR: 63.13 mL/min (ref >60.00)
Potassium: 4.2 mEq/L (ref 3.5–5.1)
Sodium: 142 mEq/L (ref 135–145)
Total Protein: 7.1 g/dL (ref 6.0–8.3)

## 2011-09-03 ENCOUNTER — Encounter: Payer: Self-pay | Admitting: Internal Medicine

## 2011-09-03 ENCOUNTER — Ambulatory Visit (INDEPENDENT_AMBULATORY_CARE_PROVIDER_SITE_OTHER): Payer: PRIVATE HEALTH INSURANCE | Admitting: Internal Medicine

## 2011-09-03 VITALS — BP 100/68 | HR 83 | Temp 98.0°F | Ht 69.0 in | Wt 143.4 lb

## 2011-09-03 DIAGNOSIS — Z23 Encounter for immunization: Secondary | ICD-10-CM

## 2011-09-03 DIAGNOSIS — Z0000 Encounter for general adult medical examination without abnormal findings: Secondary | ICD-10-CM

## 2011-09-03 MED ORDER — CLONAZEPAM 0.5 MG PO TABS: ORAL_TABLET | ORAL | Status: DC

## 2011-09-03 MED ORDER — ATORVASTATIN CALCIUM 80 MG PO TABS: 80.0000 mg | ORAL_TABLET | Freq: Every day | ORAL | Status: DC

## 2011-09-03 NOTE — Patient Instructions (Addendum)
You had the flu shot today Please increase the lipitor to 80 mg per day Continue all other medications as before; you are given the clonazepam refill today Please have the pharmacy call if you need other refills Please return in 1 year for your yearly visit, or sooner if needed, with Lab testing done 3-5 days before

## 2011-09-03 NOTE — Progress Notes (Signed)
Subjective:    Patient ID: Christina Waller, female    DOB: 05-21-1956, 55 y.o.   MRN: [id number]  HPI  Here for wellness and f/u;  Overall doing ok;  Pt denies CP, worsening SOB, DOE, wheezing, orthopnea, PND, worsening LE edema, palpitations, dizziness or syncope.  Pt denies neurological change such as new Headache, facial or extremity weakness.  Pt denies polydipsia, polyuria, or low sugar symptoms. Pt states overall good compliance with treatment and medications, good tolerability, and trying to follow lower cholesterol diet.  Pt denies worsening depressive symptoms, suicidal ideation or panic. No fever, wt loss, night sweats, loss of appetite, or other constitutional symptoms.  Pt states good ability with ADL's, low fall risk, home safety reviewed and adequate, no significant changes in hearing or vision, and occasionally active with exercise.  Tried to reduce the lyrica but FMS symptoms recurred.  Has pending divorce soon, now separated. Ins no longer covers Dr Clarisse Gouge Jamas Lav for migraine. Past Medical History  Diagnosis Date  . Depression   . Anxiety   . Hyperlipidemia   . Anemia     NOS  . Headache   . Migraines   . Restless leg syndrome   . Fibromyalgia   . Allergic rhinitis   . Osteopenia   . Unspecified vitamin D deficiency    Past Surgical History  Procedure Date  . Tubal ligation     bilat    reports that she has never smoked. She does not have any smokeless tobacco history on file. She reports that she drinks alcohol. She reports that she does not use illicit drugs. family history includes Arthritis in an unspecified family member; Depression in an unspecified family member; Heart disease in an unspecified family member; Hypertension in an unspecified family member; Osteoporosis in an unspecified family member; and Ulcers in an unspecified family member.  There is no history of Colon cancer. No Known Allergies Current Outpatient Prescriptions on File Prior to Visit  Medication  Sig Dispense Refill  . atorvastatin (LIPITOR) 40 MG tablet Take 40 mg by mouth daily.        [name] buPROPion (WELLBUTRIN XL) 150 MG 24 hr tablet Take 150 mg by mouth daily.        . clonazePAM (KLONOPIN) 0.5 MG tablet Take 0.5 mg by mouth. 1 by mouth in the am and 1 to 2 by mouth at bedtime as needed       . DULoxetine (CYMBALTA) 30 MG capsule Take 30 mg by mouth 3 (three) times daily.        . methocarbamol (ROBAXIN) 500 MG tablet Take 500 mg by mouth as needed.        . pregabalin (LYRICA) 50 MG capsule Take 2 capsules (100 mg total) by mouth 3 (three) times daily.  180 capsule  0  . SUMAtriptan (IMITREX) 100 MG tablet TAKE 1 TABLET BY MOUTH DAILY AS DIRECTED  9 tablet  6  . topiramate (TOPAMAX) 100 MG tablet Take 100 mg by mouth at bedtime.        [name] zolpidem (AMBIEN) 10 MG tablet Take 10 mg by mouth at bedtime as needed.         Review of Systems Review of Systems  Constitutional: Negative for diaphoresis, activity change, appetite change and unexpected weight change.  HENT: Negative for hearing loss, ear pain, facial swelling, mouth sores and neck stiffness.   Eyes: Negative for pain, redness and visual disturbance.  Respiratory: Negative for shortness of breath and wheezing.  Cardiovascular: Negative for chest pain and palpitations.  Gastrointestinal: Negative for diarrhea, blood in stool, abdominal distention and rectal pain.  Genitourinary: Negative for hematuria, flank pain and decreased urine volume.  Musculoskeletal: Negative for myalgias and joint swelling.  Skin: Negative for color change and wound.  Neurological: Negative for syncope and numbness.  Hematological: Negative for adenopathy.  Psychiatric/Behavioral: Negative for hallucinations, self-injury, decreased concentration and agitation.      Objective:   Physical Exam BP 100/68  Pulse 83  Temp(Src) 98 F (36.7 C) (Oral)  Ht 5\' 9"  (1.753 m)  Wt 143 lb 6 oz (65.034 kg)  BMI 21.17 kg/m2  SpO2 99% Physical Exam  VS  noted Constitutional: Pt is oriented to person, place, and time. Appears well-developed and well-nourished.  Head: Normocephalic and atraumatic.  Right Ear: External ear normal.  Left Ear: External ear normal.  Nose: Nose normal.  Mouth/Throat: Oropharynx is clear and moist.  Eyes: Conjunctivae and EOM are normal. Pupils are equal, round, and reactive to light.  Neck: Normal range of motion. Neck supple. No JVD present. No tracheal deviation present.  Cardiovascular: Normal rate, regular rhythm, normal heart sounds and intact distal pulses.   Pulmonary/Chest: Effort normal and breath sounds normal.  Abdominal: Soft. Bowel sounds are normal. There is no tenderness.  Musculoskeletal: Normal range of motion. Exhibits no edema.  Lymphadenopathy:  Has no cervical adenopathy.  Neurological: Pt is alert and oriented to person, place, and time. Pt has normal reflexes. No cranial nerve deficit.  Skin: Skin is warm and dry. No rash noted.  Psychiatric:  Has  normal mood and affect. Behavior is normal.         Assessment & Plan:

## 2011-09-03 NOTE — Assessment & Plan Note (Signed)
Overall doing well, age appropriate education and counseling updated, referrals for preventative services and immunizations addressed, dietary and smoking counseling addressed, most recent labs and ECG reviewed.  I have personally reviewed and have noted: 1) the patient's medical and social history 2) The pt's use of alcohol, tobacco, and illicit drugs 3) The patient's current medications and supplements 4) Functional ability including ADL's, fall risk, home safety risk, hearing and visual impairment 5) Diet and physical activities 6) Evidence for depression or mood disorder 7) The patient's height, weight, and BMI have been recorded in the chart I have made referrals, and provided counseling and education based on review of the above ECG reveiwed - results on emr

## 2011-10-29 ENCOUNTER — Other Ambulatory Visit: Payer: Self-pay

## 2011-10-29 MED ORDER — PREGABALIN 50 MG PO CAPS: 100.0000 mg | ORAL_CAPSULE | Freq: Three times a day (TID) | ORAL | Status: DC

## 2011-12-22 ENCOUNTER — Other Ambulatory Visit: Payer: Self-pay

## 2011-12-22 MED ORDER — PREGABALIN 50 MG PO CAPS: 100.0000 mg | ORAL_CAPSULE | Freq: Three times a day (TID) | ORAL | Status: DC

## 2011-12-22 NOTE — Telephone Encounter (Signed)
Addended by: Anselm Jungling on: 12/22/2011 01:34 PM   Modules accepted: Orders

## 2012-03-08 ENCOUNTER — Other Ambulatory Visit: Payer: Self-pay

## 2012-03-08 MED ORDER — TOPIRAMATE 100 MG PO TABS: 100.0000 mg | ORAL_TABLET | Freq: Every day | ORAL | Status: DC

## 2012-03-08 NOTE — Telephone Encounter (Signed)
Pt called requesting a refill of Topamax. [facility] but her insurance has chanegd and is no longer acceptated at that office.

## 2012-03-08 NOTE — Telephone Encounter (Signed)
Pt advised via VM 

## 2012-04-28 ENCOUNTER — Other Ambulatory Visit: Payer: Self-pay

## 2012-04-28 MED ORDER — SUMATRIPTAN SUCCINATE 100 MG PO TABS: 100.0000 mg | ORAL_TABLET | ORAL | Status: DC | PRN

## 2012-04-28 NOTE — Telephone Encounter (Signed)
Done per emr 

## 2012-04-28 NOTE — Telephone Encounter (Signed)
Pt called requesting refill of Imitrex. Pt states she no longer has medical insurance and cannot afford appt with [facility]. Please advise.

## 2012-05-01 NOTE — Telephone Encounter (Signed)
Patient informed. 

## 2012-06-11 ENCOUNTER — Other Ambulatory Visit: Payer: Self-pay | Admitting: Internal Medicine

## 2012-07-26 ENCOUNTER — Telehealth: Payer: Self-pay | Admitting: Internal Medicine

## 2012-07-27 NOTE — Telephone Encounter (Signed)
Need name of med requested

## 2012-07-27 NOTE — Telephone Encounter (Signed)
Please advise if ok to refill for pt

## 2012-09-04 ENCOUNTER — Ambulatory Visit (INDEPENDENT_AMBULATORY_CARE_PROVIDER_SITE_OTHER): Payer: PRIVATE HEALTH INSURANCE

## 2012-09-04 ENCOUNTER — Other Ambulatory Visit (INDEPENDENT_AMBULATORY_CARE_PROVIDER_SITE_OTHER): Payer: PRIVATE HEALTH INSURANCE

## 2012-09-04 DIAGNOSIS — Z111 Encounter for screening for respiratory tuberculosis: Secondary | ICD-10-CM

## 2012-09-04 DIAGNOSIS — Z0000 Encounter for general adult medical examination without abnormal findings: Secondary | ICD-10-CM

## 2012-09-04 LAB — URINALYSIS, ROUTINE W REFLEX MICROSCOPIC
Bilirubin Urine: NEGATIVE
Hgb urine dipstick: NEGATIVE
Total Protein, Urine: NEGATIVE
Urine Glucose: NEGATIVE

## 2012-09-04 LAB — CBC WITH DIFFERENTIAL/PLATELET
Eosinophils Relative: 3.6 % (ref 0.0–5.0)
HCT: 37.7 % (ref 36.0–46.0)
Lymphs Abs: 1.8 10*3/uL (ref 0.7–4.0)
Monocytes Relative: 7.3 % (ref 3.0–12.0)
Platelets: 206 10*3/uL (ref 150.0–400.0)
WBC: 5.8 10*3/uL (ref 4.5–10.5)

## 2012-09-05 LAB — HEPATIC FUNCTION PANEL
ALT: 19 U/L (ref 0–35)
AST: 25 U/L (ref 0–37)
Alkaline Phosphatase: 65 U/L (ref 39–117)
Bilirubin, Direct: 0.1 mg/dL (ref 0.0–0.3)
Total Bilirubin: 0.9 mg/dL (ref 0.3–1.2)

## 2012-09-05 LAB — BASIC METABOLIC PANEL
BUN: 18 mg/dL (ref 6–23)
Calcium: 9.3 mg/dL (ref 8.4–10.5)
Creatinine, Ser: 1.1 mg/dL (ref 0.4–1.2)
GFR: 56.78 mL/min — ABNORMAL LOW (ref >60.00)
Glucose, Bld: 85 mg/dL (ref 70–99)

## 2012-09-05 LAB — LDL CHOLESTEROL, DIRECT: Direct LDL: 159.6 mg/dL

## 2012-09-05 LAB — TSH: TSH: 1.78 u[IU]/mL (ref 0.35–5.50)

## 2012-09-06 LAB — TB SKIN TEST: Induration: 0 mm

## 2012-09-08 ENCOUNTER — Ambulatory Visit (INDEPENDENT_AMBULATORY_CARE_PROVIDER_SITE_OTHER): Payer: PRIVATE HEALTH INSURANCE | Admitting: Internal Medicine

## 2012-09-08 ENCOUNTER — Encounter: Payer: Self-pay | Admitting: Internal Medicine

## 2012-09-08 VITALS — BP 102/70 | HR 82 | Temp 98.2°F | Ht 69.5 in | Wt 147.5 lb

## 2012-09-08 DIAGNOSIS — Z0000 Encounter for general adult medical examination without abnormal findings: Secondary | ICD-10-CM

## 2012-09-08 MED ORDER — ATORVASTATIN CALCIUM 80 MG PO TABS: 80.0000 mg | ORAL_TABLET | Freq: Every day | ORAL | Status: DC

## 2012-09-08 MED ORDER — TOPIRAMATE 100 MG PO TABS: 100.0000 mg | ORAL_TABLET | Freq: Every day | ORAL | Status: DC

## 2012-09-08 MED ORDER — CLONAZEPAM 0.5 MG PO TABS: ORAL_TABLET | ORAL | Status: DC

## 2012-09-08 NOTE — Progress Notes (Signed)
Subjective:    Patient ID: Christina Waller, female    DOB: 1956/09/22, 56 y.o.   MRN: [id number]  HPI  Here for wellness and f/u;  Overall doing ok;  Pt denies CP, worsening SOB, DOE, wheezing, orthopnea, PND, worsening LE edema, palpitations, dizziness or syncope.  Pt denies neurological change such as new Headache, facial or extremity weakness.  Pt denies polydipsia, polyuria, or low sugar symptoms. Pt states overall good compliance with treatment and medications, good tolerability, and trying to follow lower cholesterol diet.  Pt denies worsening depressive symptoms, suicidal ideation or panic. No fever, wt loss, night sweats, loss of appetite, or other constitutional symptoms.  Pt states good ability with ADL's, low fall risk, home safety reviewed and adequate, no significant changes in hearing or vision, and occasionally active with exercise.  Works 2 part time jobs.  Past Medical History  Diagnosis Date  . Depression   . Anxiety   . Hyperlipidemia   . Anemia     NOS  . Headache   . Migraines   . Restless leg syndrome   . Fibromyalgia   . Allergic rhinitis   . Osteopenia   . Unspecified vitamin D deficiency    Past Surgical History  Procedure Date  . Tubal ligation     bilat    reports that she has never smoked. She does not have any smokeless tobacco history on file. She reports that she drinks alcohol. She reports that she does not use illicit drugs. family history includes Arthritis in an unspecified family member; Depression in an unspecified family member; Heart disease in an unspecified family member; Hypertension in an unspecified family member; Osteoporosis in an unspecified family member; and Ulcers in an unspecified family member.  There is no history of Colon cancer. No Known Allergies Current Outpatient Prescriptions on File Prior to Visit  Medication Sig Dispense Refill  . DULoxetine (CYMBALTA) 30 MG capsule Take 30 mg by mouth 3 (three) times daily.        .  pregabalin (LYRICA) 50 MG capsule Take 2 capsules (100 mg total) by mouth 3 (three) times daily.  180 capsule  5  . SUMAtriptan (IMITREX) 100 MG tablet Take 1 tablet (100 mg total) by mouth every 2 (two) hours as needed for migraine.  9 tablet  6  . DISCONTD: clonazePAM (KLONOPIN) 0.5 MG tablet 1 by mouth in the am and 1 to 2 by mouth at bedtime as needed  60 tablet  5  . DISCONTD: topiramate (TOPAMAX) 100 MG tablet TAKE 1 TABLET (100 MG TOTAL) BY MOUTH AT BEDTIME.  30 tablet  2  . DISCONTD: atorvastatin (LIPITOR) 80 MG tablet Take 1 tablet (80 mg total) by mouth daily.  90 tablet  3   Review of Systems Review of Systems  Constitutional: Negative for diaphoresis, activity change, appetite change and unexpected weight change.  HENT: Negative for hearing loss, ear pain, facial swelling, mouth sores and neck stiffness.   Eyes: Negative for pain, redness and visual disturbance.  Respiratory: Negative for shortness of breath and wheezing.   Cardiovascular: Negative for chest pain and palpitations.  Gastrointestinal: Negative for diarrhea, blood in stool, abdominal distention and rectal pain.  Genitourinary: Negative for hematuria, flank pain and decreased urine volume.  Musculoskeletal: Negative for myalgias and joint swelling.  Skin: Negative for color change and wound.  Neurological: Negative for syncope and numbness.  Hematological: Negative for adenopathy.  Psychiatric/Behavioral: Negative for hallucinations, self-injury, decreased concentration and agitation.  Objective:   Physical Exam BP 102/70  Pulse 82  Temp 98.2 F (36.8 C) (Oral)  Ht 5' 9.5" (1.765 m)  Wt 147 lb 8 oz (66.906 kg)  BMI 21.47 kg/m2  SpO2 97% Physical Exam  VS noted Constitutional: Pt is oriented to person, place, and time. Appears well-developed and well-nourished.  HENT:  Head: Normocephalic and atraumatic.  Right Ear: External ear normal.  Left Ear: External ear normal.  Nose: Nose normal.    Mouth/Throat: Oropharynx is clear and moist.  Eyes: Conjunctivae and EOM are normal. Pupils are equal, round, and reactive to light.  Neck: Normal range of motion. Neck supple. No JVD present. No tracheal deviation present.  Cardiovascular: Normal rate, regular rhythm, normal heart sounds and intact distal pulses.   Pulmonary/Chest: Effort normal and breath sounds normal.  Abdominal: Soft. Bowel sounds are normal. There is no tenderness.  Musculoskeletal: Normal range of motion. Exhibits no edema.  Lymphadenopathy:  Has no cervical adenopathy.  Neurological: Pt is alert and oriented to person, place, and time. Pt has normal reflexes. No cranial nerve deficit.  Skin: Skin is warm and dry. No rash noted.  Psychiatric:  Has  normal mood and affect. Behavior is normal.     Assessment & Plan:

## 2012-09-08 NOTE — Patient Instructions (Addendum)
Please remember your flu shot at work Continue all other medications as before Your refills are done today (the clonazepam done in hardcopy) Please have the pharmacy call with any other refills you may need. Please continue your efforts at being more active, low cholesterol diet, and weight control. Please remember to sign up for My Chart at your earliest convenience, as this will be important to you in the future with finding out test results. Please remember to followup with your GYN for the yearly pap smear and/or mammogram (consider [facility], and [facility] on [address]) Please keep your appointments with your specialists as you have planned - Dr Evelene Croon Please return in 1 year for your yearly visit, or sooner if needed, with Lab testing done 3-5 days before

## 2012-09-08 NOTE — Assessment & Plan Note (Signed)

## 2012-09-09 ENCOUNTER — Other Ambulatory Visit: Payer: Self-pay | Admitting: Internal Medicine

## 2012-09-25 ENCOUNTER — Encounter: Payer: Self-pay | Admitting: Gastroenterology

## 2012-10-08 ENCOUNTER — Other Ambulatory Visit: Payer: Self-pay | Admitting: Internal Medicine

## 2012-10-09 ENCOUNTER — Other Ambulatory Visit: Payer: Self-pay | Admitting: Internal Medicine

## 2012-12-31 ENCOUNTER — Other Ambulatory Visit: Payer: Self-pay | Admitting: Internal Medicine

## 2013-01-12 ENCOUNTER — Other Ambulatory Visit: Payer: Self-pay | Admitting: Obstetrics and Gynecology

## 2013-01-12 DIAGNOSIS — M8580 Other specified disorders of bone density and structure, unspecified site: Secondary | ICD-10-CM

## 2013-01-12 DIAGNOSIS — Z1231 Encounter for screening mammogram for malignant neoplasm of breast: Secondary | ICD-10-CM

## 2013-01-12 DIAGNOSIS — Z9882 Breast implant status: Secondary | ICD-10-CM

## 2013-02-07 ENCOUNTER — Other Ambulatory Visit: Payer: Self-pay | Admitting: Internal Medicine

## 2013-02-07 NOTE — Telephone Encounter (Signed)
Done hardcopy to robin  

## 2013-02-07 NOTE — Telephone Encounter (Signed)
Faxed hardcopy to pharmacy. 

## 2013-02-09 ENCOUNTER — Other Ambulatory Visit: Payer: PRIVATE HEALTH INSURANCE

## 2013-02-15 ENCOUNTER — Telehealth: Payer: Self-pay

## 2013-02-15 MED ORDER — ATORVASTATIN CALCIUM 80 MG PO TABS: 80.0000 mg | ORAL_TABLET | Freq: Every day | ORAL | Status: DC

## 2013-02-15 NOTE — Telephone Encounter (Signed)
refill 

## 2013-03-09 ENCOUNTER — Ambulatory Visit
Admission: RE | Admit: 2013-03-09 | Discharge: 2013-03-09 | Disposition: A | Discharge status: Home / Self Care | Payer: BC Managed Care – PPO | Source: Ambulatory Visit | Attending: Obstetrics and Gynecology | Admitting: Obstetrics and Gynecology

## 2013-03-09 DIAGNOSIS — Z9882 Breast implant status: Secondary | ICD-10-CM

## 2013-03-09 DIAGNOSIS — M8580 Other specified disorders of bone density and structure, unspecified site: Secondary | ICD-10-CM

## 2013-03-09 DIAGNOSIS — Z1231 Encounter for screening mammogram for malignant neoplasm of breast: Secondary | ICD-10-CM

## 2013-03-15 NOTE — Progress Notes (Signed)
Pt was notified of  Her BMD results and Dr. Harlene Salts recommendation of calcium 600mg  daily and Vit D as instructed and plenty of wt. Bearing exercising. Pt was ok with this.

## 2013-04-04 ENCOUNTER — Telehealth: Payer: Self-pay | Admitting: Internal Medicine

## 2013-04-04 NOTE — Telephone Encounter (Signed)
rec'd from [facility] forward 2 page Dr.John 04/04/13

## 2013-04-18 ENCOUNTER — Telehealth: Payer: Self-pay

## 2013-04-18 MED ORDER — CLONAZEPAM 0.5 MG PO TABS: ORAL_TABLET | ORAL | Status: DC

## 2013-04-18 NOTE — Telephone Encounter (Signed)
The patient is requesting a refill on clonazepam 0.5.  She was informed was denied as needs OV.  States was informed by MD to schedule at last OV appt. For October 2014.  Does she need OV or can rx be filled?  Please advise call back number [phone]

## 2013-04-18 NOTE — Telephone Encounter (Signed)
This was apparently an error  rx Done hardcopy to robin

## 2013-04-19 NOTE — Telephone Encounter (Signed)
Faxed hardcopy to [facility] [address] and called the patient left detailed message of MD instructions.

## 2013-05-14 ENCOUNTER — Other Ambulatory Visit: Payer: Self-pay | Admitting: Internal Medicine

## 2013-06-12 ENCOUNTER — Telehealth: Payer: Self-pay | Admitting: *Deleted

## 2013-06-12 NOTE — Telephone Encounter (Signed)
R'cd fax from [facility] for PA on Sumatriptan. PA form requested, placed on MD's desk for signature.

## 2013-06-13 NOTE — Telephone Encounter (Signed)
Rx faxed to insurance company, awaiting response.  

## 2013-06-20 NOTE — Telephone Encounter (Signed)
Will close note and once response received will add addendum.

## 2013-08-19 ENCOUNTER — Other Ambulatory Visit: Payer: Self-pay | Admitting: Internal Medicine

## 2013-08-20 NOTE — Telephone Encounter (Signed)
Ok to refill? Last OV 9.27.13 Last filled 6.2.14

## 2013-08-21 NOTE — Telephone Encounter (Signed)
Faxed hardcopy to [facility] [address] [place] 

## 2013-08-21 NOTE — Telephone Encounter (Signed)
Done hardcopy to robin  

## 2013-09-14 ENCOUNTER — Encounter: Payer: Self-pay | Admitting: Internal Medicine

## 2013-09-14 ENCOUNTER — Ambulatory Visit (INDEPENDENT_AMBULATORY_CARE_PROVIDER_SITE_OTHER): Payer: BC Managed Care – PPO | Admitting: Internal Medicine

## 2013-09-14 ENCOUNTER — Other Ambulatory Visit (INDEPENDENT_AMBULATORY_CARE_PROVIDER_SITE_OTHER): Payer: BC Managed Care – PPO

## 2013-09-14 VITALS — BP 102/68 | HR 90 | Temp 98.7°F | Ht 69.0 in | Wt 143.8 lb

## 2013-09-14 DIAGNOSIS — Z0000 Encounter for general adult medical examination without abnormal findings: Secondary | ICD-10-CM

## 2013-09-14 DIAGNOSIS — M25551 Pain in right hip: Secondary | ICD-10-CM

## 2013-09-14 DIAGNOSIS — Z23 Encounter for immunization: Secondary | ICD-10-CM

## 2013-09-14 DIAGNOSIS — M25559 Pain in unspecified hip: Secondary | ICD-10-CM

## 2013-09-14 LAB — CBC WITH DIFFERENTIAL/PLATELET
Basophils Relative: 0.9 % (ref 0.0–3.0)
Eosinophils Absolute: 0.2 10*3/uL (ref 0.0–0.7)
Hemoglobin: 11.4 g/dL — ABNORMAL LOW (ref 12.0–15.0)
Lymphocytes Relative: 33.2 % (ref 12.0–46.0)
MCHC: 34.3 g/dL (ref 30.0–36.0)
MCV: 94.3 fl (ref 78.0–100.0)
Monocytes Absolute: 0.5 10*3/uL (ref 0.1–1.0)
Neutro Abs: 3.6 10*3/uL (ref 1.4–7.7)
Platelets: 207 10*3/uL (ref 150.0–400.0)
RBC: 3.54 Mil/uL — ABNORMAL LOW (ref 3.87–5.11)
RDW: 13.4 % (ref 11.5–14.6)

## 2013-09-14 LAB — LIPID PANEL
Cholesterol: 227 mg/dL — ABNORMAL HIGH (ref 0–200)
Total CHOL/HDL Ratio: 3
VLDL: 29.8 mg/dL (ref 0.0–40.0)

## 2013-09-14 LAB — HEPATIC FUNCTION PANEL
ALT: 28 U/L (ref 0–35)
AST: 29 U/L (ref 0–37)
Albumin: 3.9 g/dL (ref 3.5–5.2)
Alkaline Phosphatase: 64 U/L (ref 39–117)
Bilirubin, Direct: 0.1 mg/dL (ref 0.0–0.3)
Total Bilirubin: 0.6 mg/dL (ref 0.3–1.2)
Total Protein: 6.7 g/dL (ref 6.0–8.3)

## 2013-09-14 LAB — URINALYSIS, ROUTINE W REFLEX MICROSCOPIC
Bilirubin Urine: NEGATIVE
Hgb urine dipstick: NEGATIVE
Nitrite: NEGATIVE
Total Protein, Urine: NEGATIVE
Urine Glucose: NEGATIVE

## 2013-09-14 LAB — LDL CHOLESTEROL, DIRECT: Direct LDL: 133.2 mg/dL

## 2013-09-14 LAB — BASIC METABOLIC PANEL
Calcium: 9 mg/dL (ref 8.4–10.5)
Chloride: 108 mEq/L (ref 96–112)
Creatinine, Ser: 1 mg/dL (ref 0.4–1.2)
Glucose, Bld: 91 mg/dL (ref 70–99)
Potassium: 4.3 mEq/L (ref 3.5–5.1)
Sodium: 139 mEq/L (ref 135–145)

## 2013-09-14 MED ORDER — DULOXETINE HCL 30 MG PO CPEP: 30.0000 mg | ORAL_CAPSULE | Freq: Three times a day (TID) | ORAL | Status: DC

## 2013-09-14 MED ORDER — ATORVASTATIN CALCIUM 80 MG PO TABS: 80.0000 mg | ORAL_TABLET | Freq: Every day | ORAL | Status: DC

## 2013-09-14 MED ORDER — TRAMADOL HCL 50 MG PO TABS: 50.0000 mg | ORAL_TABLET | Freq: Four times a day (QID) | ORAL | Status: DC | PRN

## 2013-09-14 MED ORDER — CLONAZEPAM 0.5 MG PO TABS: ORAL_TABLET | ORAL | Status: DC

## 2013-09-14 MED ORDER — SUMATRIPTAN SUCCINATE 100 MG PO TABS: ORAL_TABLET | ORAL | Status: DC

## 2013-09-14 MED ORDER — PREGABALIN 50 MG PO CAPS: ORAL_CAPSULE | ORAL | Status: DC

## 2013-09-14 MED ORDER — TOPIRAMATE 100 MG PO TABS: 100.0000 mg | ORAL_TABLET | Freq: Every day | ORAL | Status: DC

## 2013-09-14 NOTE — Progress Notes (Signed)
Subjective:    Patient ID: Christina Waller, female    DOB: [birth year], 57 y.o.   MRN: [id number]  HPI Here for wellness and f/u;  Overall doing ok;  Pt denies CP, worsening SOB, DOE, wheezing, orthopnea, PND, worsening LE edema, palpitations, dizziness or syncope.  Pt denies neurological change such as new headache, facial or extremity weakness.  Pt denies polydipsia, polyuria, or low sugar symptoms. Pt states overall good compliance with treatment and medications, good tolerability, and has been trying to follow lower cholesterol diet.  Pt denies worsening depressive symptoms, suicidal ideation or panic. No fever, night sweats, wt loss, loss of appetite, or other constitutional symptoms.  Pt states good ability with ADL's, has low fall risk, home safety reviewed and adequate, no other significant changes in hearing or vision, and only occasionally active with exercise.  No acute complaints except has ongoing right hip pain, has appt with Dr Madelin Rear soon, worse since more active with the 52 mo old grandchild, ibuprofen not working well. Past Medical History  Diagnosis Date  . Depression   . Anxiety   . Hyperlipidemia   . Anemia     NOS  . Headache(784.0)   . Migraines   . Restless leg syndrome   . Fibromyalgia   . Allergic rhinitis   . Osteopenia   . Unspecified vitamin D deficiency    Past Surgical History  Procedure Laterality Date  . Tubal ligation      bilat    reports that she has never smoked. She does not have any smokeless tobacco history on file. She reports that  drinks alcohol. She reports that she does not use illicit drugs. family history includes Arthritis in an other family member; Depression in an other family member; Heart disease in an other family member; Hypertension in an other family member; Osteoporosis in an other family member; Ulcers in an other family member. There is no history of Colon cancer. No Known Allergies Current Outpatient Prescriptions on File Prior  to Visit  Medication Sig Dispense Refill  . clonazePAM (KLONOPIN) 0.5 MG tablet 1 by mouth in the am and 1 to 2 by mouth at bedtime as needed  60 tablet  5  . LYRICA 50 MG capsule TAKE 2 CAPSULES 3 TIMES A DAY  180 capsule  1  . SUMAtriptan (IMITREX) 100 MG tablet TAKE 1 TABLET BY MOUTH EVERY 2HOURS AS NEEDED FOR MIGRAINE.  9 tablet  6  . topiramate (TOPAMAX) 100 MG tablet Take 1 tablet (100 mg total) by mouth at bedtime.  90 tablet  3  . topiramate (TOPAMAX) 100 MG tablet TAKE 1 TABLET (100 MG TOTAL) BY MOUTH AT BEDTIME.  30 tablet  2   No current facility-administered medications on file prior to visit.   Review of Systems Constitutional: Negative for diaphoresis, activity change, appetite change or unexpected weight change.  HENT: Negative for hearing loss, ear pain, facial swelling, mouth sores and neck stiffness.   Eyes: Negative for pain, redness and visual disturbance.  Respiratory: Negative for shortness of breath and wheezing.   Cardiovascular: Negative for chest pain and palpitations.  Gastrointestinal: Negative for diarrhea, blood in stool, abdominal distention or other pain Genitourinary: Negative for hematuria, flank pain or change in urine volume.  Musculoskeletal: Negative for myalgias and joint swelling.  Skin: Negative for color change and wound.  Neurological: Negative for syncope and numbness. other than noted Hematological: Negative for adenopathy.  Psychiatric/Behavioral: Negative for hallucinations, self-injury, decreased concentration and agitation.  Objective:   Physical Exam BP 102/68  Pulse 90  Temp(Src) 98.7 F (37.1 C) (Oral)  Ht 5\' 9"  (1.753 m)  Wt 143 lb 12 oz (65.205 kg)  BMI 21.22 kg/m2  SpO2 98% VS noted,  Constitutional: Pt is oriented to person, place, and time. Appears well-developed and well-nourished.  Head: Normocephalic and atraumatic.  Right Ear: External ear normal.  Left Ear: External ear normal.  Nose: Nose normal.   Mouth/Throat: Oropharynx is clear and moist.  Eyes: Conjunctivae and EOM are normal. Pupils are equal, round, and reactive to light.  Neck: Normal range of motion. Neck supple. No JVD present. No tracheal deviation present.  Cardiovascular: Normal rate, regular rhythm, normal heart sounds and intact distal pulses.   Pulmonary/Chest: Effort normal and breath sounds normal.  Abdominal: Soft. Bowel sounds are normal. There is no tenderness. No HSM  Musculoskeletal: Normal range of motion. Exhibits no edema.  Lymphadenopathy:  Has no cervical adenopathy.  Neurological: Pt is alert and oriented to person, place, and time. Pt has normal reflexes. No cranial nerve deficit.  Skin: Skin is warm and dry. No rash noted.  Psychiatric:  Has  normal mood and affect. Behavior is normal. except mild dysphoric    Assessment & Plan:

## 2013-09-14 NOTE — Assessment & Plan Note (Signed)
Ok for tramadol prn pain, to f/u ortho as planned

## 2013-09-14 NOTE — Patient Instructions (Addendum)
You had the flu shot today Please take all new medication as prescribed - the pain medication as needed Please continue all other medications as before, and all refills have been done Please continue your efforts at being more active, low cholesterol diet, and weight control. You are otherwise up to date with prevention measures today. Please keep your appointments with your specialists as you have planned - Dr Madelin Rear  Please go to the LAB in the Basement (turn left off the elevator) for the tests to be done today  You will be contacted by phone if any changes need to be made immediately.  Otherwise, you will receive a letter about your results with an explanation, but please check with MyChart first.  Please return in 1 year for your yearly visit, or sooner if needed, with Lab testing done 3-5 days before

## 2013-09-14 NOTE — Assessment & Plan Note (Signed)

## 2013-09-17 ENCOUNTER — Encounter: Payer: Self-pay | Admitting: Internal Medicine

## 2013-09-17 ENCOUNTER — Other Ambulatory Visit: Payer: Self-pay | Admitting: Internal Medicine

## 2013-09-17 DIAGNOSIS — D649 Anemia, unspecified: Secondary | ICD-10-CM

## 2013-09-19 ENCOUNTER — Other Ambulatory Visit (INDEPENDENT_AMBULATORY_CARE_PROVIDER_SITE_OTHER): Payer: BC Managed Care – PPO

## 2013-09-19 DIAGNOSIS — D649 Anemia, unspecified: Secondary | ICD-10-CM

## 2013-09-19 LAB — IBC PANEL: Saturation Ratios: 17.9 % — ABNORMAL LOW (ref 20.0–50.0)

## 2013-09-20 ENCOUNTER — Encounter: Payer: Self-pay | Admitting: Internal Medicine

## 2013-09-20 LAB — FERRITIN: Ferritin: 28.5 ng/mL (ref 10.0–291.0)

## 2013-09-20 LAB — VITAMIN B12: Vitamin B-12: 1437 pg/mL — ABNORMAL HIGH (ref 211–911)

## 2013-10-17 ENCOUNTER — Other Ambulatory Visit: Payer: Self-pay | Admitting: Internal Medicine

## 2013-10-17 NOTE — Telephone Encounter (Signed)
Klonopin too soon, just done oct 3

## 2013-10-18 MED ORDER — TRAZODONE HCL 50 MG PO TABS: 25.0000 mg | ORAL_TABLET | Freq: Every evening | ORAL | Status: DC | PRN

## 2013-10-18 NOTE — Telephone Encounter (Signed)
Pt called again requesting refill on Trazadone.  I spoke with pharmacist she states the last Rx filled 10.7.14 was only a 20 day supply based on the instructions.  Please advise

## 2013-10-18 NOTE — Telephone Encounter (Signed)
Ok for trazodone   Still too soon for [employer]

## 2013-10-19 MED ORDER — CLONAZEPAM 0.5 MG PO TABS: ORAL_TABLET | ORAL | Status: DC

## 2013-10-19 NOTE — Telephone Encounter (Signed)
Called the patient informed of medication refill.  The patient was informed klonopin was given at OV on 09/14/13.  The patient states she still has all her paperwork from her OV BUT she has no prescription for klonopin.  Please Advise

## 2013-10-19 NOTE — Telephone Encounter (Signed)
Faxed hardcopy to [facility] [address] [place].  Called the patient to inform rx sent in.

## 2013-10-19 NOTE — Telephone Encounter (Signed)
Called left message to call back 

## 2013-10-19 NOTE — Telephone Encounter (Signed)
Done hardcopy to robin  

## 2013-11-02 ENCOUNTER — Encounter: Payer: Self-pay | Admitting: Internal Medicine

## 2013-11-02 ENCOUNTER — Ambulatory Visit (INDEPENDENT_AMBULATORY_CARE_PROVIDER_SITE_OTHER): Payer: BC Managed Care – PPO | Admitting: Internal Medicine

## 2013-11-02 VITALS — BP 106/70 | HR 99 | Temp 98.0°F | Ht 69.0 in | Wt 146.2 lb

## 2013-11-02 DIAGNOSIS — F3289 Other specified depressive episodes: Secondary | ICD-10-CM

## 2013-11-02 DIAGNOSIS — J309 Allergic rhinitis, unspecified: Secondary | ICD-10-CM

## 2013-11-02 DIAGNOSIS — J209 Acute bronchitis, unspecified: Secondary | ICD-10-CM

## 2013-11-02 DIAGNOSIS — F329 Major depressive disorder, single episode, unspecified: Secondary | ICD-10-CM

## 2013-11-02 MED ORDER — PREDNISONE 10 MG PO TABS: ORAL_TABLET | ORAL | Status: DC

## 2013-11-02 MED ORDER — HYDROCODONE-HOMATROPINE 5-1.5 MG/5ML PO SYRP: 5.0000 mL | ORAL_SOLUTION | Freq: Four times a day (QID) | ORAL | Status: DC | PRN

## 2013-11-02 MED ORDER — LEVOFLOXACIN 250 MG PO TABS: 250.0000 mg | ORAL_TABLET | Freq: Every day | ORAL | Status: DC

## 2013-11-02 NOTE — Patient Instructions (Signed)
Please take all new medication as prescribed Please continue all other medications as before, and refills have been done if requested.  Please remember to sign up for My Chart if you have not done so, as this will be important to you in the future with finding out test results, communicating by private email, and scheduling acute appointments online when needed.   

## 2013-11-02 NOTE — Progress Notes (Signed)
Pre-visit discussion using our clinic review tool. No additional management support is needed unless otherwise documented below in the visit note.  

## 2013-11-03 DIAGNOSIS — J209 Acute bronchitis, unspecified: Secondary | ICD-10-CM | POA: Insufficient documentation

## 2013-11-03 NOTE — Assessment & Plan Note (Signed)
Mild to mod, for antibx course,  to f/u any worsening symptoms or concerns 

## 2013-11-03 NOTE — Assessment & Plan Note (Signed)
For otc allegra prn,  to f/u any worsening symptoms or concerns  

## 2013-11-03 NOTE — Progress Notes (Signed)
Subjective:    Patient ID: Christina Waller, female    DOB: 1956/08/20, 57 y.o.   MRN: [id number]  HPI  Here with acute onset mild to mod 2-3 days ST, HA, general weakness and malaise, with prod cough greenish sputum, but Pt denies chest pain, increased sob or doe, wheezing, orthopnea, PND, increased LE swelling, palpitations, dizziness or syncope. Does have several wks ongoing nasal allergy symptoms with clearish congestion, itch and sneezing, without fever, pain, ST, cough, swelling or wheezing.  Denies worsening depressive symptoms, suicidal ideation, or panic Past Medical History  Diagnosis Date  . Depression   . Anxiety   . Hyperlipidemia   . Anemia     NOS  . Headache(784.0)   . Migraines   . Restless leg syndrome   . Fibromyalgia   . Allergic rhinitis   . Osteopenia   . Unspecified vitamin D deficiency    Past Surgical History  Procedure Laterality Date  . Tubal ligation      bilat    reports that she has never smoked. She does not have any smokeless tobacco history on file. She reports that she drinks alcohol. She reports that she does not use illicit drugs. family history includes Arthritis in an other family member; Depression in an other family member; Heart disease in an other family member; Hypertension in an other family member; Osteoporosis in an other family member; Ulcers in an other family member. There is no history of Colon cancer. No Known Allergies Current Outpatient Prescriptions on File Prior to Visit  Medication Sig Dispense Refill  . atorvastatin (LIPITOR) 80 MG tablet Take 1 tablet (80 mg total) by mouth daily.  90 tablet  3  . clonazePAM (KLONOPIN) 0.5 MG tablet 1 by mouth in the am and 1 to 2 by mouth at bedtime as needed  60 tablet  5  . DULoxetine (CYMBALTA) 30 MG capsule Take 1 capsule (30 mg total) by mouth 3 (three) times daily.  270 capsule  3  . pregabalin (LYRICA) 50 MG capsule TAKE 2 CAPSULES 3 TIMES A DAY  180 capsule  5  . SUMAtriptan (IMITREX)  100 MG tablet TAKE 1 TABLET BY MOUTH EVERY 2HOURS AS NEEDED FOR MIGRAINE.  10 tablet  11  . topiramate (TOPAMAX) 100 MG tablet Take 1 tablet (100 mg total) by mouth at bedtime.  90 tablet  3  . traMADol (ULTRAM) 50 MG tablet Take 1 tablet (50 mg total) by mouth every 6 (six) hours as needed for pain.  60 tablet  1   No current facility-administered medications on file prior to visit.   Review of Systems  Constitutional: Negative for unexpected weight change, or unusual diaphoresis  HENT: Negative for tinnitus.   Eyes: Negative for photophobia and visual disturbance.  Respiratory: Negative for choking and stridor.   Gastrointestinal: Negative for vomiting and blood in stool.  Genitourinary: Negative for hematuria and decreased urine volume.  Musculoskeletal: Negative for acute joint swelling Skin: Negative for color change and wound.  Neurological: Negative for tremors and numbness other than noted  Psychiatric/Behavioral: Negative for decreased concentration or  hyperactivity.       Objective:   Physical Exam BP 106/70  Pulse 99  Temp(Src) 98 F (36.7 C) (Oral)  Ht 5\' 9"  (1.753 m)  Wt 146 lb 4 oz (66.339 kg)  BMI 21.59 kg/m2  SpO2 92% VS noted, mild ill Constitutional: Pt appears well-developed and well-nourished.  HENT: Head: NCAT.  Right Ear: External ear normal.  Left Ear: External ear normal.  Bilat tm's with mild erythema.  Max sinus areas non tender.  Pharynx with mild erythema, no exudate Eyes: Conjunctivae and EOM are normal. Pupils are equal, round, and reactive to light.  Neck: Normal range of motion. Neck supple.  Cardiovascular: Normal rate and regular rhythm.   Pulmonary/Chest: Effort normal and breath sounds normal.  Neurological: Pt is alert. Not confused  Skin: Skin is warm. No erythema.  Psychiatric: Pt behavior is normal. Thought content normal. Not depressed affect    Assessment & Plan:

## 2013-11-03 NOTE — Assessment & Plan Note (Signed)
stable overall by history and exam, recent data reviewed with pt, and pt to continue medical treatment as before,  to f/u any worsening symptoms or concerns Lab Results  Component Value Date   WBC 6.6 09/14/2013   HGB 11.4* 09/14/2013   HCT 33.4* 09/14/2013   PLT 207.0 09/14/2013   GLUCOSE 91 09/14/2013   CHOL 227* 09/14/2013   TRIG 149.0 09/14/2013   HDL 75.20 09/14/2013   LDLDIRECT 133.2 09/14/2013   ALT 28 09/14/2013   AST 29 09/14/2013   NA 139 09/14/2013   K 4.3 09/14/2013   CL 108 09/14/2013   CREATININE 1.0 09/14/2013   BUN 17 09/14/2013   CO2 23 09/14/2013   TSH 1.40 09/14/2013

## 2014-01-01 ENCOUNTER — Telehealth: Payer: Self-pay | Admitting: Nurse Practitioner

## 2014-01-01 ENCOUNTER — Encounter: Payer: Self-pay | Admitting: Nurse Practitioner

## 2014-01-01 ENCOUNTER — Ambulatory Visit (INDEPENDENT_AMBULATORY_CARE_PROVIDER_SITE_OTHER): Payer: BC Managed Care – PPO | Admitting: Nurse Practitioner

## 2014-01-01 VITALS — BP 126/84 | Temp 98.1°F | Resp 16 | Ht 69.0 in | Wt 142.0 lb

## 2014-01-01 DIAGNOSIS — R8290 Unspecified abnormal findings in urine: Secondary | ICD-10-CM

## 2014-01-01 DIAGNOSIS — R102 Pelvic and perineal pain: Secondary | ICD-10-CM

## 2014-01-01 DIAGNOSIS — N949 Unspecified condition associated with female genital organs and menstrual cycle: Secondary | ICD-10-CM

## 2014-01-01 DIAGNOSIS — G8929 Other chronic pain: Secondary | ICD-10-CM

## 2014-01-01 DIAGNOSIS — M5126 Other intervertebral disc displacement, lumbar region: Secondary | ICD-10-CM

## 2014-01-01 DIAGNOSIS — R82998 Other abnormal findings in urine: Secondary | ICD-10-CM

## 2014-01-01 DIAGNOSIS — M533 Sacrococcygeal disorders, not elsewhere classified: Secondary | ICD-10-CM

## 2014-01-01 LAB — POCT URINALYSIS DIPSTICK
Urobilinogen, UA: NEGATIVE
pH, UA: 5

## 2014-01-01 LAB — CBC WITH DIFFERENTIAL/PLATELET
BASOS PCT: 1 % (ref 0–1)
Basophils Absolute: 0 10*3/uL (ref 0.0–0.1)
Eosinophils Absolute: 0.1 10*3/uL (ref 0.0–0.7)
Eosinophils Relative: 1 % (ref 0–5)
HCT: 38.1 % (ref 36.0–46.0)
HEMOGLOBIN: 12.8 g/dL (ref 12.0–15.0)
LYMPHS ABS: 2.4 10*3/uL (ref 0.7–4.0)
Lymphocytes Relative: 38 % (ref 12–46)
MCH: 30.9 pg (ref 26.0–34.0)
MCHC: 33.6 g/dL (ref 30.0–36.0)
MCV: 92 fL (ref 78.0–100.0)
Monocytes Absolute: 0.5 10*3/uL (ref 0.1–1.0)
Monocytes Relative: 8 % (ref 3–12)
NEUTROS ABS: 3.4 10*3/uL (ref 1.7–7.7)
NEUTROS PCT: 52 % (ref 43–77)
Platelets: 257 10*3/uL (ref 150–400)
RBC: 4.14 MIL/uL (ref 3.87–5.11)
RDW: 14.2 % (ref 11.5–15.5)
WBC: 6.5 10*3/uL (ref 4.0–10.5)

## 2014-01-01 NOTE — Telephone Encounter (Signed)
Spoke with pt who noticed some right side pain yesterday morning. She took OTC pain med and went to work. Pt says she "didn't feel too good" but it wasn't so bad she had to stay home. The pain woke her in the middle of the night last night and felt worse, a piercing constant pain. Pt denies fever, urinary symptoms, or GI symptoms. Scheduled OV with PG today at 3:30.

## 2014-01-01 NOTE — Patient Instructions (Signed)
If pain worsens in any way to go to ER.

## 2014-01-01 NOTE — Progress Notes (Signed)
PUS scheduled in office for 01/03/14 appointment at 1400 with appointment with Dr. Sabra Heck for 1430. Insurance benefits done.

## 2014-01-01 NOTE — Progress Notes (Signed)
Subjective:     Patient ID: Christina Waller, female   DOB: [birth year], 58 y.o.   MRN: [id number]   This 58 yo WD Fe G3,P3 with history of acute onset of RLQ pelvic pain at 5 am 1/19.  Pain scale 7-8/10.  Other associated symptoms of decrease appetite and maybe slight nausea. She felt chilled at times and had a headache today.  She had no change in symptoms with bowel or bladder habits.  Pain was intermittent and this am took Tramadol for pain with moderate relief. No history of trauma.  She did drive to [place] this weekend and slept on a different bed which she attributes to a flare of low back pain and sciatica without radiation.   Pelvic Pain The patient's primary symptoms include pelvic pain. The patient's pertinent negatives include no vaginal discharge. Primary symptoms comment: sudden onset 1/19 awakened from sleep at 5 am and today at 5 am. This is a new problem. The current episode started yesterday. The problem occurs intermittently. The problem has been unchanged. Pain severity now: pain scale 7-8 /10. The problem affects the right side. She is not pregnant. Associated symptoms include anorexia, back pain, chills, flank pain, headaches and joint pain. Pertinent negatives include no constipation, diarrhea, discolored urine, dysuria, fever, frequency, hematuria, joint swelling, nausea, painful intercourse, rash, sore throat, urgency or vomiting. The symptoms are aggravated by tactile pressure (no changes with bowel or bladder habits). She has tried oral narcotics (Tramadol 50 mg po this am with some help) for the symptoms. The treatment provided moderate relief. She is sexually active. No, her partner does not have an STD. She is postmenopausal. Her past medical history is significant for herpes simplex. (Rare occurence of HSV that is on mid back)     Review of Systems  Constitutional: Positive for chills and appetite change. Negative for fever.  HENT: Negative for sore throat.   Respiratory:  Negative.   Cardiovascular: Negative.   Gastrointestinal: Positive for anorexia. Negative for nausea, vomiting, diarrhea and constipation.       Bloating after today's meal  Genitourinary: Positive for flank pain and pelvic pain. Negative for dysuria, urgency, frequency, hematuria, vaginal bleeding, vaginal discharge and vaginal pain.       Last SA last Thursday without pain.  History of abnormal pap with + HR HPV.  Colpo biopsy 02/09/13 benign. No history of renal calculi. Mother with history of Endometrial cancer. Her concerns are about OV cancer.  Musculoskeletal: Positive for back pain and joint pain.       History of HNP L 2-3 with surgery. History of back pain and sciatica. Drove to [place] this weekend and lifted her 20 lb grand daughter.  Skin: Negative for rash.  Neurological: Positive for headaches. Negative for dizziness, weakness and numbness.  Psychiatric/Behavioral: Negative.        Objective:   Physical Exam  Constitutional: She is oriented to person, place, and time. She appears well-developed and well-nourished. No distress.  Concerned about nature of pain but not overly anxious.  Cardiovascular: Normal rate, regular rhythm and normal heart sounds.   Pulmonary/Chest: Effort normal and breath sounds normal. She has no wheezes. She has no rales. She exhibits no tenderness.  Abdominal: Soft. Bowel sounds are normal. She exhibits no distension and no mass. There is tenderness. There is no rebound and no guarding.  More uncomfortable on right flank +/- pain. Able to illicit pain with deep palpation.  Tender along the anterior superior iliac  crest and the Psoas muscle on the right.  Genitourinary:  No vaginal discharge. No lesions.  On bimanual on left without pain and on right again able to illicit pain.  No mass in the adnexa. No cervical motion pain.  Musculoskeletal:  Tender at right SI joint space. SLR and ROM bilateral hips without pain and Homan's sign is negative.  Reflexes and DTR's normal. No edema.  Neurological: She is alert and oriented to person, place, and time.  Skin: Skin is warm and dry.  Psychiatric: She has a normal mood and affect. Her behavior is normal. Judgment and thought content normal.       Assessment:     RLQ pain - most likely MS origin R/O ovarian origin of pain - doubtful FMH: mother with Endometrial cancer History of HNP with recent travel and SI joint pain without radiation to extremities.    Plan:     Will get Stat CBC and follow this pm Will send urine for C & S and follow - did not start med's Will get PUS for Thursday and follow She is aware of symptom changes and if gets worse to go to ER. Discussed patient care with Dr. Sabra Heck  5:50 pm She is informed about normal CBC. ( WBC is 6.5)

## 2014-01-01 NOTE — Telephone Encounter (Signed)
Pt is having lower right sided pain near her ovaries.

## 2014-01-02 NOTE — Telephone Encounter (Signed)
LMTCB for update on how she is doing.

## 2014-01-02 NOTE — Telephone Encounter (Signed)
If pain not improved or worse, needs to go to the Emergency Department for evaluation.

## 2014-01-02 NOTE — Telephone Encounter (Signed)
Message copied by Michele Mcalpine on Wed Jan 02, 2014 11:56 AM ------      Message from: Megan Salon      Created: Wed Jan 02, 2014 11:14 AM       Pt saw PG yesterday with pelvic pain complaint.  Has U/S scheduled tomorrow.  Can you check on her and make sure doesn't need to be seen sooner.  I may not check messages again this afternoon so if needs MD advise, please ask Dr. Quincy Simmonds. ------

## 2014-01-02 NOTE — Telephone Encounter (Signed)
Spoke with patient at time of her call into our office. She states that pain was worse this morning and she ate breakfast, took a tramadol and went to work. Felt nauseated later in the day and vomited x 1. Is unsure if r/t tramadol, but states that Tramadol did help with the pain. She states she ate lunch and has tolerated well so far. She is working from 2-5 pm, states she cannot miss any more work. States she has not had chills or fevers. She is okay with keeping appointment for tomorrow. Advised if continues to have vomiting or develops fevers/chills or any worsening of symptoms to call back or go to urgent care. Patient is agreeable to plan and verbalized understanding of instructions.  Routing to Dr. Sabra Heck and Dr. Quincy Simmonds.

## 2014-01-02 NOTE — Telephone Encounter (Signed)
Message left to advise patient if any worsening symptoms to go to Er.

## 2014-01-02 NOTE — Telephone Encounter (Signed)
Message left to return call to Lonette Stevison at [phone].    

## 2014-01-02 NOTE — Telephone Encounter (Signed)
Patient was returning call to tracy will be on lunch til 2

## 2014-01-03 ENCOUNTER — Ambulatory Visit (INDEPENDENT_AMBULATORY_CARE_PROVIDER_SITE_OTHER): Payer: BC Managed Care – PPO | Admitting: Obstetrics & Gynecology

## 2014-01-03 ENCOUNTER — Ambulatory Visit (INDEPENDENT_AMBULATORY_CARE_PROVIDER_SITE_OTHER): Payer: BC Managed Care – PPO

## 2014-01-03 ENCOUNTER — Other Ambulatory Visit: Payer: Self-pay | Admitting: Obstetrics & Gynecology

## 2014-01-03 VITALS — BP 104/62 | Ht 69.0 in | Wt 142.0 lb

## 2014-01-03 DIAGNOSIS — N9489 Other specified conditions associated with female genital organs and menstrual cycle: Secondary | ICD-10-CM

## 2014-01-03 DIAGNOSIS — R1031 Right lower quadrant pain: Secondary | ICD-10-CM

## 2014-01-03 LAB — URINE CULTURE

## 2014-01-03 NOTE — Progress Notes (Signed)
Reviewed personally.  M. Suzanne Shiva Karis, MD.  

## 2014-01-03 NOTE — Patient Instructions (Signed)
Please go to the ER if you have an acute onset of pain again.

## 2014-01-03 NOTE — Progress Notes (Signed)
Patient scheduled for outpatient CT ABD/Pelvis with and without contrast scheduled for [address] at [place] for 01/09/13 at 1500. Patient given instructions. Will pick up oral contrast today. Agreeable to time/date/location. Will send for insurance precert.

## 2014-01-03 NOTE — Progress Notes (Signed)
58 y.o. [id number] MarriedCaucasianF here for PUS.  Pt has awakened from sleep duet to RLQ pain.  Was seen initially by Edman Circle, on 1/20.  Patty discussed pt with me.  Patty did not feel pt had an acute abdomen but that she did have real tenderness.  CBC was normal.  Pt was scheduled for PUS.  We checked on her yesterday and she said she was fine.  Today, she states that she really didn't feel well yesterday.  Took a tramadol 50mg  yesterday.  She has this for hip pain and takes from time to time.  Since the abdominal pain was so significant, she took one.  Then she had lightheadedness almost all day.  Did work but felt worse after going to work.  Did have emesis in AM and felt clammy and sweaty for a little while.  This passed and she did eat lunch and dinner without any new onset of nausea.    No fevers.  No urinary issues.  No diarrhea/no constipation.  Is passing flatus.    Does have 29 pound 5 month-old granddaughter that she keeps every Friday.  Has been lifting her a lot.  No LMP recorded. Patient is postmenopausal.            ROS:  Pertinent items are noted in HPI.  Otherwise, a comprehensive ROS was negative.  Exam:   BP 104/62  Ht 5\' 9"  (1.753 m)  Wt 142 lb (64.411 kg)  BMI 20.96 kg/m2  General appearance: alert, cooperative and appears stated age Head: Normocephalic, without obvious abnormality, atraumatic Abdomen: soft, mild RLQ tenderness in inguinal region; bowel sounds normal; no masses,  no organomegaly Extremities: extremities normal, atraumatic, no cyanosis or edema   ULTRASOUNDFINDINGS: UTERUS: 5.6 x 3.8 x 2.6cm EMS: 0.33mm ADNEXA:   Left ovary 1.6 x 0.8 x 0.9cm   Right ovary 1.5 x 0.6 x 0.6cm, ovaries without cysts/masses CUL DE SAC: no free fluid   A:  RLQ pain.  Possible inguinal hernia with partial small bowel obstruction that resolved?  P:   Plan CT scan with po/iv contrast.  No hx of underlying renal issues.  ~15 minutes spent with patient >50% of time was  in face to face discussion of above.

## 2014-01-04 ENCOUNTER — Encounter: Payer: Self-pay | Admitting: Obstetrics & Gynecology

## 2014-01-04 ENCOUNTER — Telehealth: Payer: Self-pay | Admitting: Emergency Medicine

## 2014-01-04 DIAGNOSIS — R1031 Right lower quadrant pain: Secondary | ICD-10-CM

## 2014-01-04 NOTE — Telephone Encounter (Signed)
Message left to advise patient that CT was authorized by [employer] ID #[id number] until 02/02/14. Call back with any questions.

## 2014-01-08 NOTE — Telephone Encounter (Signed)
[facility] calling this morning to request clarification on order for CT/ABD pelvis with and wo contrast. Advised RLQ, R/O hernia ? SBO that resolved. Discussed with Dr. Sabra Heck. Okay to order CT Abd/Pelvis with contrast only.

## 2014-01-08 NOTE — Addendum Note (Signed)
Addended by: Michele Mcalpine on: 01/08/2014 09:38 AM   Modules accepted: Orders

## 2014-01-09 ENCOUNTER — Ambulatory Visit
Admission: RE | Admit: 2014-01-09 | Discharge: 2014-01-09 | Disposition: A | Discharge status: Home / Self Care | Payer: No Typology Code available for payment source | Source: Ambulatory Visit | Attending: Obstetrics & Gynecology | Admitting: Obstetrics & Gynecology

## 2014-01-09 DIAGNOSIS — R1031 Right lower quadrant pain: Secondary | ICD-10-CM

## 2014-01-09 MED ORDER — IOHEXOL 300 MG/ML  SOLN
100.0000 mL | Freq: Once | INTRAMUSCULAR | Status: AC | PRN
  Administered 2014-01-09: 100 mL via INTRAVENOUS

## 2014-01-11 ENCOUNTER — Telehealth: Payer: Self-pay | Admitting: Emergency Medicine

## 2014-01-11 NOTE — Progress Notes (Signed)
Spoke with patient and message given. See telephone encounter.

## 2014-01-11 NOTE — Telephone Encounter (Signed)
Message copied by Michele Mcalpine on Fri Jan 11, 2014  8:33 AM ------      Message from: Megan Salon      Created: Wed Jan 09, 2014  4:42 PM       Inform ct is negative for hernia.  Otherwise, essentially normal.  I would like to see her back in 3-4 weeks for recheck.  She needs to call if having any pain before that time.            CC: Edman Circle ------

## 2014-01-18 ENCOUNTER — Ambulatory Visit: Payer: Self-pay | Admitting: Nurse Practitioner

## 2014-01-23 NOTE — Telephone Encounter (Signed)
Notes Recorded by Karen Chafe, RN on 01/11/2014 at 9:59 AM Spoke with patient and message given. Follow up appointment scheduled. Patient will call with any further pain.

## 2014-01-30 ENCOUNTER — Other Ambulatory Visit: Payer: Self-pay | Admitting: Internal Medicine

## 2014-01-31 MED ORDER — TRAMADOL HCL 50 MG PO TABS: ORAL_TABLET | ORAL | Status: DC

## 2014-01-31 NOTE — Telephone Encounter (Signed)
Done hardcopy to robin  

## 2014-01-31 NOTE — Telephone Encounter (Signed)
Faxed hardcopy to [facility]

## 2014-01-31 NOTE — Telephone Encounter (Signed)
Did not receive hardcopy 

## 2014-01-31 NOTE — Telephone Encounter (Signed)
Done again

## 2014-02-13 ENCOUNTER — Telehealth: Payer: Self-pay

## 2014-02-13 NOTE — Telephone Encounter (Signed)
lmtcb needs to reschedule AEX due to jury duty//kn 

## 2014-02-14 NOTE — Telephone Encounter (Signed)
Patient left message on after-hours answering machine cancelling her "AEX/?Hernia recheck/tf" for 02/15/14. She will call us back at a later date to reschedule.

## 2014-02-15 ENCOUNTER — Ambulatory Visit: Payer: BC Managed Care – PPO | Admitting: Obstetrics & Gynecology

## 2014-02-18 LAB — HM MAMMOGRAPHY

## 2014-04-04 ENCOUNTER — Other Ambulatory Visit: Payer: Self-pay | Admitting: Internal Medicine

## 2014-04-13 ENCOUNTER — Other Ambulatory Visit: Payer: Self-pay | Admitting: Internal Medicine

## 2014-04-15 NOTE — Telephone Encounter (Signed)
Faxed script back to [facility].../lmb 

## 2014-06-09 ENCOUNTER — Other Ambulatory Visit: Payer: Self-pay | Admitting: Internal Medicine

## 2014-06-11 NOTE — Telephone Encounter (Signed)
Done hardcopy to robin  

## 2014-06-11 NOTE — Telephone Encounter (Signed)
Faxed hardcopy to [facility] [address] [place] 

## 2014-07-10 ENCOUNTER — Other Ambulatory Visit: Payer: Self-pay | Admitting: Internal Medicine

## 2014-07-10 NOTE — Telephone Encounter (Signed)
Faxed Lyrica refill to [facility]

## 2014-07-19 ENCOUNTER — Encounter: Payer: Self-pay | Admitting: Gastroenterology

## 2014-08-20 ENCOUNTER — Encounter: Payer: Self-pay | Admitting: Gastroenterology

## 2014-08-26 ENCOUNTER — Other Ambulatory Visit: Payer: Self-pay | Admitting: Internal Medicine

## 2014-08-27 NOTE — Telephone Encounter (Signed)
Done hardcopy to robin  

## 2014-08-28 NOTE — Telephone Encounter (Signed)
Hardcopy has been faxed to patients pharmacy by [facility] [place]

## 2014-09-13 ENCOUNTER — Other Ambulatory Visit (INDEPENDENT_AMBULATORY_CARE_PROVIDER_SITE_OTHER): Payer: BC Managed Care – PPO

## 2014-09-13 ENCOUNTER — Ambulatory Visit (INDEPENDENT_AMBULATORY_CARE_PROVIDER_SITE_OTHER): Payer: BC Managed Care – PPO | Admitting: Internal Medicine

## 2014-09-13 ENCOUNTER — Encounter: Payer: Self-pay | Admitting: Internal Medicine

## 2014-09-13 VITALS — BP 140/84 | HR 102 | Temp 98.5°F | Ht 69.0 in | Wt 141.0 lb

## 2014-09-13 DIAGNOSIS — J302 Other seasonal allergic rhinitis: Secondary | ICD-10-CM

## 2014-09-13 DIAGNOSIS — M5441 Lumbago with sciatica, right side: Secondary | ICD-10-CM

## 2014-09-13 DIAGNOSIS — F411 Generalized anxiety disorder: Secondary | ICD-10-CM

## 2014-09-13 DIAGNOSIS — Z23 Encounter for immunization: Secondary | ICD-10-CM

## 2014-09-13 DIAGNOSIS — Z0189 Encounter for other specified special examinations: Secondary | ICD-10-CM

## 2014-09-13 DIAGNOSIS — Z0000 Encounter for general adult medical examination without abnormal findings: Secondary | ICD-10-CM

## 2014-09-13 LAB — CBC WITH DIFFERENTIAL/PLATELET
BASOS PCT: 0.5 % (ref 0.0–3.0)
Basophils Absolute: 0 10*3/uL (ref 0.0–0.1)
Eosinophils Absolute: 0.1 10*3/uL (ref 0.0–0.7)
Eosinophils Relative: 1.8 % (ref 0.0–5.0)
HEMATOCRIT: 37.3 % (ref 36.0–46.0)
Hemoglobin: 12.6 g/dL (ref 12.0–15.0)
Lymphocytes Relative: 30.9 % (ref 12.0–46.0)
Lymphs Abs: 1.8 10*3/uL (ref 0.7–4.0)
MCHC: 33.8 g/dL (ref 30.0–36.0)
MCV: 94.2 fl (ref 78.0–100.0)
MONO ABS: 0.6 10*3/uL (ref 0.1–1.0)
MONOS PCT: 10 % (ref 3.0–12.0)
NEUTROS PCT: 56.8 % (ref 43.0–77.0)
Neutro Abs: 3.3 10*3/uL (ref 1.4–7.7)
Platelets: 213 10*3/uL (ref 150.0–400.0)
RBC: 3.96 Mil/uL (ref 3.87–5.11)
RDW: 13.3 % (ref 11.5–15.5)
WBC: 5.8 10*3/uL (ref 4.0–10.5)

## 2014-09-13 LAB — BASIC METABOLIC PANEL
BUN: 15 mg/dL (ref 6–23)
CO2: 31 mEq/L (ref 19–32)
CREATININE: 1 mg/dL (ref 0.4–1.2)
Calcium: 9.3 mg/dL (ref 8.4–10.5)
Chloride: 105 mEq/L (ref 96–112)
GFR: 63.97 mL/min (ref >60.00)
Glucose, Bld: 79 mg/dL (ref 70–99)
Potassium: 3.5 mEq/L (ref 3.5–5.1)
Sodium: 139 mEq/L (ref 135–145)

## 2014-09-13 LAB — URINALYSIS, ROUTINE W REFLEX MICROSCOPIC
BILIRUBIN URINE: NEGATIVE
Hgb urine dipstick: NEGATIVE
Ketones, ur: NEGATIVE
Leukocytes, UA: NEGATIVE
Nitrite: NEGATIVE
PH: 6.5 (ref 5.0–8.0)
Specific Gravity, Urine: 1.01 (ref 1.000–1.030)
TOTAL PROTEIN, URINE-UPE24: NEGATIVE
Urine Glucose: NEGATIVE
Urobilinogen, UA: 0.2 (ref 0.0–1.0)
WBC UA: NONE SEEN (ref 0–?)

## 2014-09-13 LAB — HEPATIC FUNCTION PANEL
ALT: 20 U/L (ref 0–35)
AST: 22 U/L (ref 0–37)
Albumin: 4.2 g/dL (ref 3.5–5.2)
Alkaline Phosphatase: 66 U/L (ref 39–117)
Bilirubin, Direct: 0.1 mg/dL (ref 0.0–0.3)
Total Bilirubin: 0.7 mg/dL (ref 0.2–1.2)
Total Protein: 7.4 g/dL (ref 6.0–8.3)

## 2014-09-13 LAB — LIPID PANEL
CHOL/HDL RATIO: 3
Cholesterol: 273 mg/dL — ABNORMAL HIGH (ref 0–200)
HDL: 80 mg/dL (ref >39.00)
LDL CALC: 177 mg/dL — AB (ref 0–99)
NonHDL: 193
TRIGLYCERIDES: 81 mg/dL (ref 0.0–149.0)
VLDL: 16.2 mg/dL (ref 0.0–40.0)

## 2014-09-13 LAB — TSH: TSH: 1.17 u[IU]/mL (ref 0.35–4.50)

## 2014-09-13 MED ORDER — DULOXETINE HCL 30 MG PO CPEP: 30.0000 mg | ORAL_CAPSULE | Freq: Two times a day (BID) | ORAL | Status: DC

## 2014-09-13 MED ORDER — TRAMADOL HCL 50 MG PO TABS: ORAL_TABLET | ORAL | Status: DC

## 2014-09-13 MED ORDER — CLONAZEPAM 0.5 MG PO TABS: ORAL_TABLET | ORAL | Status: DC

## 2014-09-13 NOTE — Patient Instructions (Addendum)
You had the flu shot today  Please continue all other medications as before, and refills have been done if requested  - the clonazepam, tramadol, also the cymbalta  Please have the pharmacy call with any other refills you may need.  Please continue your efforts at being more active, low cholesterol diet, and weight control.  You are otherwise up to date with prevention measures today.  Please keep your appointments with your specialists as you may have planned  Please go to the LAB in the Basement (turn left off the elevator) for the tests to be done today  You will be contacted by phone if any changes need to be made immediately.  Otherwise, you will receive a letter about your results with an explanation, but please check with MyChart first.  Please remember to sign up for MyChart if you have not done so, as this will be important to you in the future with finding out test results, communicating by private email, and scheduling acute appointments online when needed.  Please return in 1 year for your yearly visit, or sooner if needed, with Lab testing done 3-5 days before

## 2014-09-13 NOTE — Progress Notes (Signed)
Pre visit review using our clinic review tool, if applicable. No additional management support is needed unless otherwise documented below in the visit note. 

## 2014-09-13 NOTE — Progress Notes (Signed)
Subjective:    Patient ID: Christina Waller, female    DOB: [birth year], 58 y.o.   MRN: [id number]  HPI  Here for wellness and f/u;  Overall doing ok;  Pt denies CP, worsening SOB, DOE, wheezing, orthopnea, PND, worsening LE edema, palpitations, dizziness or syncope.  Pt denies neurological change such as new headache, facial or extremity weakness.  Pt denies polydipsia, polyuria, or low sugar symptoms. Pt states overall good compliance with treatment and medications, good tolerability, and has been trying to follow lower cholesterol diet.  Pt denies worsening depressive symptoms, suicidal ideation or panic. No fever, night sweats, wt loss, loss of appetite, or other constitutional symptoms.  Pt states good ability with ADL's, has low fall risk, home safety reviewed and adequate, no other significant changes in hearing or vision.  RLQ pain has resolved from jan 2015, but felt a few pains coming back last few days. Denies urinary symptoms such as dysuria, frequency, urgency, flank pain, hematuria or n/v, fever, chills.  Had been getting Cymbalta per Dr Buddy Duty but no longer sees, needs refill, doing well with anxiety and FMS. Pt continues to have recurring LBP without change in severity, bowel or bladder change, fever, wt loss,  worsening LE pain/numbness/weakness, gait change or falls.  Has some cough with lying down at night with post nasal gtt, long hx of allergies, and shots in past. Past Medical History  Diagnosis Date  . Depression   . Anxiety   . Hyperlipidemia   . Anemia     NOS  . Headache(784.0)   . Migraines   . Restless leg syndrome   . Fibromyalgia   . Allergic rhinitis   . Osteopenia   . Unspecified vitamin D deficiency   . HNP (herniated nucleus pulposus), lumbar     L 2-3  . STD (sexually transmitted disease)     HSV that outbreaks mid back   Past Surgical History  Procedure Laterality Date  . Tubal ligation      bilat  . Colonoscopy  11/05    normal recheck in 8 years  .  Breast surgery    . Augmentation mammaplasty Bilateral 5/01    saline    reports that she has never smoked. She has never used smokeless tobacco. She reports that she drinks alcohol. She reports that she does not use illicit drugs. family history includes Arthritis in an other family member; Depression in an other family member; Heart disease in an other family member; Hypertension in an other family member; Osteoporosis in an other family member; Ulcers in an other family member. There is no history of Colon cancer. No Known Allergies Current Outpatient Prescriptions on File Prior to Visit  Medication Sig Dispense Refill  . atorvastatin (LIPITOR) 80 MG tablet Take 1 tablet (80 mg total) by mouth daily.  90 tablet  3  . clonazePAM (KLONOPIN) 0.5 MG tablet TAKE 1 TABLET IN THE MORNING AND 1 TO 2 TABLETS AT BEDTIME AS NEEDED  60 tablet  5  . HYDROcodone-homatropine (HYCODAN) 5-1.5 MG/5ML syrup Take 5 mLs by mouth every 6 (six) hours as needed for cough.  180 mL  0  . LYRICA 50 MG capsule TAKE 2 CAPSULES BY MOUTH THREE TIMES DAILY  180 capsule  1  . SUMAtriptan (IMITREX) 100 MG tablet TAKE 1 TABLET BY MOUTH EVERY 2HOURS AS NEEDED FOR MIGRAINE.  10 tablet  11  . traMADol (ULTRAM) 50 MG tablet TAKE 1 TABLET EVERY 6 HOURS AS NEEDED FOR  PAIN  60 tablet  2   No current facility-administered medications on file prior to visit.   Review of Systems Constitutional: Negative for increased diaphoresis, other activity, appetite or other siginficant weight change  HENT: Negative for worsening hearing loss, ear pain, facial swelling, mouth sores and neck stiffness.   Eyes: Negative for other worsening pain, redness or visual disturbance.  Respiratory: Negative for shortness of breath and wheezing.   Cardiovascular: Negative for chest pain and palpitations.  Gastrointestinal: Negative for diarrhea, blood in stool, abdominal distention or other pain Genitourinary: Negative for hematuria, flank pain or change  in urine volume.  Musculoskeletal: Negative for myalgias or other joint complaints.  Skin: Negative for color change and wound.  Neurological: Negative for syncope and numbness. other than noted Hematological: Negative for adenopathy. or other swelling Psychiatric/Behavioral: Negative for hallucinations, self-injury, decreased concentration or other worsening agitation.      Objective:   Physical Exam BP 140/84  Pulse 102  Temp(Src) 98.5 F (36.9 C) (Oral)  Ht 5\' 9"  (1.753 m)  Wt 141 lb (63.957 kg)  BMI 20.81 kg/m2  SpO2 98% VS noted,  Constitutional: Pt is oriented to person, place, and time. Appears well-developed and well-nourished.  Head: Normocephalic and atraumatic.  Right Ear: External ear normal.  Left Ear: External ear normal.  Nose: Nose normal.  Mouth/Throat: Oropharynx is clear and moist.  Eyes: Conjunctivae and EOM are normal. Pupils are equal, round, and reactive to light.  Neck: Normal range of motion. Neck supple. No JVD present. No tracheal deviation present.  Cardiovascular: Normal rate, regular rhythm, normal heart sounds and intact distal pulses.   Pulmonary/Chest: Effort normal and breath sounds without rales or wheezing  Abdominal: Soft. Bowel sounds are normal. NT. No HSM  Musculoskeletal: Normal range of motion. Exhibits no edema.  Lymphadenopathy:  Has no cervical adenopathy.  Neurological: Pt is alert and oriented to person, place, and time. Pt has normal reflexes. No cranial nerve deficit. Motor grossly intact Skin: Skin is warm and dry. No rash noted.  Psychiatric:  Has normal mood and affect. Behavior is normal.     Assessment & Plan:

## 2014-09-14 ENCOUNTER — Other Ambulatory Visit: Payer: Self-pay | Admitting: Internal Medicine

## 2014-09-15 ENCOUNTER — Other Ambulatory Visit: Payer: Self-pay | Admitting: Internal Medicine

## 2014-09-15 DIAGNOSIS — M545 Low back pain, unspecified: Secondary | ICD-10-CM | POA: Insufficient documentation

## 2014-09-15 NOTE — Assessment & Plan Note (Signed)

## 2014-09-15 NOTE — Assessment & Plan Note (Signed)
Ok for tramadol refill,  to f/u any worsening symptoms or concerns  

## 2014-09-15 NOTE — Assessment & Plan Note (Signed)
For cymbalta, klonopinn prn continue

## 2014-09-15 NOTE — Assessment & Plan Note (Signed)
For zyrtec qhs prn

## 2014-09-17 ENCOUNTER — Other Ambulatory Visit: Payer: Self-pay | Admitting: Internal Medicine

## 2014-09-17 ENCOUNTER — Encounter: Payer: Self-pay | Admitting: Internal Medicine

## 2014-09-17 MED ORDER — ATORVASTATIN CALCIUM 80 MG PO TABS: 80.0000 mg | ORAL_TABLET | Freq: Every day | ORAL | Status: DC

## 2014-09-20 ENCOUNTER — Encounter: Payer: BC Managed Care – PPO | Admitting: Internal Medicine

## 2014-10-14 ENCOUNTER — Encounter: Payer: Self-pay | Admitting: Internal Medicine

## 2014-11-15 ENCOUNTER — Other Ambulatory Visit: Payer: Self-pay | Admitting: Internal Medicine

## 2014-11-15 NOTE — Telephone Encounter (Signed)
Done hardcopy to [facility]

## 2014-11-15 NOTE — Telephone Encounter (Signed)
Rx faxed to [facility] [phone].

## 2014-11-27 ENCOUNTER — Telehealth: Payer: Self-pay | Admitting: *Deleted

## 2014-11-27 NOTE — Telephone Encounter (Signed)
Left msg on triage stating been taking lyrica & cymbalta for fibromyalsia. Under her new plan insurance will not cover Lyrica wanting to know is there a generic form that can work as the same as Lyrica...Johny Chess

## 2014-11-27 NOTE — Telephone Encounter (Signed)
Notified pt with md response.../lmb 

## 2014-11-27 NOTE — Telephone Encounter (Signed)
Very sorry, there is no direct generic for lyrica  Gabapentin which is somewhat similar to lyrica (and is generic) does not work well for most patients in my experience

## 2015-01-02 ENCOUNTER — Other Ambulatory Visit: Payer: Self-pay | Admitting: Internal Medicine

## 2015-01-02 NOTE — Telephone Encounter (Signed)
Faxed hardcopy for Tramadol to [facility]

## 2015-01-02 NOTE — Telephone Encounter (Signed)
Done hardcopy to robin  

## 2015-02-17 ENCOUNTER — Other Ambulatory Visit: Payer: Self-pay | Admitting: Internal Medicine

## 2015-02-24 ENCOUNTER — Other Ambulatory Visit: Payer: Self-pay | Admitting: Internal Medicine

## 2015-02-25 ENCOUNTER — Other Ambulatory Visit: Payer: Self-pay | Admitting: Internal Medicine

## 2015-02-25 NOTE — Telephone Encounter (Signed)
Done hardcopy to Christina Waller  

## 2015-02-25 NOTE — Telephone Encounter (Signed)
Rx sent to pharmacy   

## 2015-02-26 NOTE — Telephone Encounter (Signed)
Rx done. 

## 2015-02-26 NOTE — Telephone Encounter (Signed)
Done hardcopy to Cherina  

## 2015-03-17 ENCOUNTER — Encounter: Payer: Self-pay | Admitting: Gastroenterology

## 2015-03-20 ENCOUNTER — Other Ambulatory Visit: Payer: Self-pay | Admitting: Internal Medicine

## 2015-04-13 ENCOUNTER — Other Ambulatory Visit: Payer: Self-pay | Admitting: Internal Medicine

## 2015-04-15 NOTE — Telephone Encounter (Signed)
Faxed script back to [facility].../lmb 

## 2015-04-15 NOTE — Telephone Encounter (Signed)
Done hardcopy to Cherina  

## 2015-04-28 ENCOUNTER — Other Ambulatory Visit: Payer: Self-pay | Admitting: Internal Medicine

## 2015-04-29 MED ORDER — TRAMADOL HCL 50 MG PO TABS: 50.0000 mg | ORAL_TABLET | Freq: Four times a day (QID) | ORAL | Status: DC | PRN

## 2015-04-29 NOTE — Telephone Encounter (Signed)
Done hardcopy to Dahlia  

## 2015-04-30 NOTE — Telephone Encounter (Signed)
Rx faxed to pharmacy  

## 2015-05-30 ENCOUNTER — Other Ambulatory Visit: Payer: Self-pay | Admitting: Internal Medicine

## 2015-06-02 ENCOUNTER — Other Ambulatory Visit: Payer: Self-pay | Admitting: Internal Medicine

## 2015-07-03 ENCOUNTER — Other Ambulatory Visit: Payer: Self-pay | Admitting: Internal Medicine

## 2015-08-13 ENCOUNTER — Other Ambulatory Visit: Payer: Self-pay | Admitting: Internal Medicine

## 2015-08-13 NOTE — Telephone Encounter (Signed)
Done hardcopy to Dahlia  

## 2015-08-13 NOTE — Telephone Encounter (Signed)
Rx faxed to pharmacy  

## 2015-08-19 ENCOUNTER — Telehealth: Payer: Self-pay | Admitting: *Deleted

## 2015-08-19 NOTE — Telephone Encounter (Signed)
Pt left msg on triage stating went to pick up refills on her Lyrica & tramadol. Lyrica was denied pharmacy stated that she couldn't take both. Pt states she been taking both meds she using the Lyrica for fibromyalgia & tramadol for bursitis. She states md told her he would refill her Lyrica she was normally getting it feel by Dr. Buddy Duty...Johny Chess

## 2015-08-19 NOTE — Telephone Encounter (Signed)
Ok to let pharmacy and pt know that this is OK for pt, and let me know if this for some reason remains and issue, thanks

## 2015-08-20 ENCOUNTER — Other Ambulatory Visit: Payer: Self-pay | Admitting: Internal Medicine

## 2015-08-20 MED ORDER — PREGABALIN 50 MG PO CAPS: 100.0000 mg | ORAL_CAPSULE | Freq: Three times a day (TID) | ORAL | Status: DC

## 2015-08-20 NOTE — Telephone Encounter (Signed)
Called pt no answer LMOM RTC.../lmb 

## 2015-08-20 NOTE — Telephone Encounter (Signed)
Pt return call back gave her md response. Verified which pharmacy. [facility] spoke with pharmacist Tanella inform her md ok for pt to take both. Gave verbal to refill Lyrica 50 mg that was sent to md on 08/13/15. Updated epic...Johny Chess

## 2015-09-19 ENCOUNTER — Encounter: Payer: BC Managed Care – PPO | Admitting: Internal Medicine

## 2015-09-28 ENCOUNTER — Other Ambulatory Visit: Payer: Self-pay | Admitting: Internal Medicine

## 2015-10-07 ENCOUNTER — Other Ambulatory Visit: Payer: Self-pay | Admitting: Internal Medicine

## 2015-10-07 NOTE — Telephone Encounter (Signed)
Rx faxed to pharmacy  

## 2015-10-23 ENCOUNTER — Encounter: Payer: Self-pay | Admitting: Internal Medicine

## 2015-10-23 ENCOUNTER — Other Ambulatory Visit (INDEPENDENT_AMBULATORY_CARE_PROVIDER_SITE_OTHER): Payer: BLUE CROSS/BLUE SHIELD

## 2015-10-23 ENCOUNTER — Other Ambulatory Visit: Payer: BLUE CROSS/BLUE SHIELD

## 2015-10-23 ENCOUNTER — Ambulatory Visit (INDEPENDENT_AMBULATORY_CARE_PROVIDER_SITE_OTHER): Payer: BLUE CROSS/BLUE SHIELD | Admitting: Internal Medicine

## 2015-10-23 VITALS — BP 110/70 | HR 79 | Temp 98.2°F | Resp 18 | Ht 69.0 in | Wt 148.5 lb

## 2015-10-23 DIAGNOSIS — L989 Disorder of the skin and subcutaneous tissue, unspecified: Secondary | ICD-10-CM | POA: Insufficient documentation

## 2015-10-23 DIAGNOSIS — Z23 Encounter for immunization: Secondary | ICD-10-CM | POA: Diagnosis not present

## 2015-10-23 DIAGNOSIS — Z0000 Encounter for general adult medical examination without abnormal findings: Secondary | ICD-10-CM | POA: Diagnosis not present

## 2015-10-23 DIAGNOSIS — M21612 Bunion of left foot: Secondary | ICD-10-CM | POA: Insufficient documentation

## 2015-10-23 LAB — CBC WITH DIFFERENTIAL/PLATELET
Basophils Absolute: 0 10*3/uL (ref 0.0–0.1)
Basophils Relative: 0.5 % (ref 0.0–3.0)
EOS PCT: 2 % (ref 0.0–5.0)
Eosinophils Absolute: 0.1 10*3/uL (ref 0.0–0.7)
HCT: 33.8 % — ABNORMAL LOW (ref 36.0–46.0)
Hemoglobin: 11.4 g/dL — ABNORMAL LOW (ref 12.0–15.0)
LYMPHS ABS: 1.8 10*3/uL (ref 0.7–4.0)
Lymphocytes Relative: 32.5 % (ref 12.0–46.0)
MCHC: 33.6 g/dL (ref 30.0–36.0)
MCV: 92.9 fl (ref 78.0–100.0)
MONO ABS: 0.5 10*3/uL (ref 0.1–1.0)
MONOS PCT: 9.4 % (ref 3.0–12.0)
NEUTROS ABS: 3.1 10*3/uL (ref 1.4–7.7)
NEUTROS PCT: 55.6 % (ref 43.0–77.0)
PLATELETS: 218 10*3/uL (ref 150.0–400.0)
RBC: 3.64 Mil/uL — AB (ref 3.87–5.11)
RDW: 13.7 % (ref 11.5–15.5)
WBC: 5.5 10*3/uL (ref 4.0–10.5)

## 2015-10-23 LAB — BASIC METABOLIC PANEL
BUN: 20 mg/dL (ref 6–23)
CO2: 30 meq/L (ref 19–32)
Calcium: 9.2 mg/dL (ref 8.4–10.5)
Chloride: 103 mEq/L (ref 96–112)
Creatinine, Ser: 0.91 mg/dL (ref 0.40–1.20)
GFR: 66.98 mL/min (ref >60.00)
GLUCOSE: 77 mg/dL (ref 70–99)
POTASSIUM: 3.8 meq/L (ref 3.5–5.1)
SODIUM: 138 meq/L (ref 135–145)

## 2015-10-23 LAB — LIPID PANEL
CHOLESTEROL: 248 mg/dL — AB (ref 0–200)
HDL: 70.8 mg/dL (ref >39.00)
LDL Cholesterol: 162 mg/dL — ABNORMAL HIGH (ref 0–99)
NONHDL: 177.01
Total CHOL/HDL Ratio: 4
Triglycerides: 76 mg/dL (ref 0.0–149.0)
VLDL: 15.2 mg/dL (ref 0.0–40.0)

## 2015-10-23 LAB — URINALYSIS, ROUTINE W REFLEX MICROSCOPIC
Bilirubin Urine: NEGATIVE
Hgb urine dipstick: NEGATIVE
Ketones, ur: NEGATIVE
Leukocytes, UA: NEGATIVE
Nitrite: NEGATIVE
PH: 6 (ref 5.0–8.0)
RBC / HPF: NONE SEEN (ref 0–?)
Specific Gravity, Urine: 1.01 (ref 1.000–1.030)
TOTAL PROTEIN, URINE-UPE24: NEGATIVE
Urine Glucose: NEGATIVE
Urobilinogen, UA: 0.2 (ref 0.0–1.0)

## 2015-10-23 LAB — HEPATIC FUNCTION PANEL
ALBUMIN: 4.3 g/dL (ref 3.5–5.2)
ALK PHOS: 70 U/L (ref 39–117)
ALT: 15 U/L (ref 0–35)
AST: 19 U/L (ref 0–37)
BILIRUBIN DIRECT: 0.1 mg/dL (ref 0.0–0.3)
TOTAL PROTEIN: 6.9 g/dL (ref 6.0–8.3)
Total Bilirubin: 0.5 mg/dL (ref 0.2–1.2)

## 2015-10-23 LAB — TSH: TSH: 1.45 u[IU]/mL (ref 0.35–4.50)

## 2015-10-23 NOTE — Assessment & Plan Note (Signed)
For derm referral

## 2015-10-23 NOTE — Patient Instructions (Addendum)
You had the flu shot today  Please continue all other medications as before, and refills have been done if requested.  Please have the pharmacy call with any other refills you may need.  Please continue your efforts at being more active, low cholesterol diet, and weight control.  You are otherwise up to date with prevention measures today.  You will be contacted regarding the referral for: colonoscopy, and podiatry  Please keep your appointments with your specialists as you may have planned  Please go to the LAB in the Basement (turn left off the elevator) for the tests to be done today  You will be contacted by phone if any changes need to be made immediately.  Otherwise, you will receive a letter about your results with an explanation, but please check with MyChart first. Please remember to sign up for MyChart if you have not done so, as this will be important to you in the future with finding out test results, communicating by private email, and scheduling acute appointments online when needed.  Please return in 1 year for your yearly visit, or sooner if needed, with Lab testing done 3-5 days before

## 2015-10-23 NOTE — Progress Notes (Signed)
Subjective:    Patient ID: Christina Waller, female    DOB: June 28, 1956, 59 y.o.   MRN: [id number]  HPI  Here for wellness and f/u;  Overall doing ok;  Pt denies Chest pain, worsening SOB, DOE, wheezing, orthopnea, PND, worsening LE edema, palpitations, dizziness or syncope.  Pt denies neurological change such as new headache, facial or extremity weakness.  Pt denies polydipsia, polyuria, or low sugar symptoms. Pt states overall good compliance with treatment and medications, good tolerability, and has been trying to follow appropriate diet.  Pt denies worsening depressive symptoms, suicidal ideation or panic. No fever, night sweats, wt loss, loss of appetite, or other constitutional symptoms.  Pt states good ability with ADL's, has low fall risk, home safety reviewed and adequate, no other significant changes in hearing or vision, and only occasionally active with exercise. Has left foot bunion pain worsening as well as ball of foot to walk for many months.  Due for full body mole check with Dr Ronnald Ramp.  Due for colonscopy f/u as per letter apri, 2016 with Dr Fuller Plan Past Medical History  Diagnosis Date  . Depression   . Anxiety   . Hyperlipidemia   . Anemia     NOS  . Headache(784.0)   . Migraines   . Restless leg syndrome   . Fibromyalgia   . Allergic rhinitis   . Osteopenia   . Unspecified vitamin D deficiency   . HNP (herniated nucleus pulposus), lumbar     L 2-3  . STD (sexually transmitted disease)     HSV that outbreaks mid back   Past Surgical History  Procedure Laterality Date  . Tubal ligation      bilat  . Colonoscopy  11/05    normal recheck in 8 years  . Breast surgery    . Augmentation mammaplasty Bilateral 5/01    saline    reports that she has never smoked. She has never used smokeless tobacco. She reports that she drinks alcohol. She reports that she does not use illicit drugs. family history includes Arthritis in an other family member; Depression in an other family  member; Heart disease in an other family member; Hypertension in an other family member; Osteoporosis in an other family member; Ulcers in an other family member. There is no history of Colon cancer. No Known Allergies Current Outpatient Prescriptions on File Prior to Visit  Medication Sig Dispense Refill  . atorvastatin (LIPITOR) 80 MG tablet TAKE ONE TABLET BY MOUTH ONCE DAILY 90 tablet 0  . clonazePAM (KLONOPIN) 0.5 MG tablet TAKE ONE TABLET BY MOUTH IN THE MORNING AND ONE TO TWO TABLETS EVERY NIGHT AT BEDTIME AS NEEDED 60 tablet 5  . DULoxetine (CYMBALTA) 30 MG capsule TAKE ONE CAPSULE BY MOUTH TWICE DAILY 180 capsule 0  . LYRICA 50 MG capsule TAKE TWO CAPSULES BY MOUTH THREE TIMES DAILY 180 capsule 0  . SUMAtriptan (IMITREX) 100 MG tablet Take 1 tablet (100 mg total) by mouth every 2 (two) hours as needed for migraine. 17 tablet 3  . traMADol (ULTRAM) 50 MG tablet TAKE ONE TABLET BY MOUTH EVERY 6 HOURS AS NEEDED FOR PAIN 60 tablet 2   No current facility-administered medications on file prior to visit.    Review of Systems Constitutional: Negative for increased diaphoresis, other activity, appetite or siginficant weight change other than noted HENT: Negative for worsening hearing loss, ear pain, facial swelling, mouth sores and neck stiffness.   Eyes: Negative for other worsening pain, redness  or visual disturbance.  Respiratory: Negative for shortness of breath and wheezing  Cardiovascular: Negative for chest pain and palpitations.  Gastrointestinal: Negative for diarrhea, blood in stool, abdominal distention or other pain Genitourinary: Negative for hematuria, flank pain or change in urine volume.  Musculoskeletal: Negative for myalgias or other joint complaints.  Skin: Negative for color change and wound or drainage.  Neurological: Negative for syncope and numbness. other than noted Hematological: Negative for adenopathy. or other swelling Psychiatric/Behavioral: Negative for  hallucinations, SI, self-injury, decreased concentration or other worsening agitation.      Objective:   Physical Exam BP 110/70 mmHg  Pulse 79  Temp(Src) 98.2 F (36.8 C) (Oral)  Resp 18  Ht 5\' 9"  (1.753 m)  Wt 148 lb 8 oz (67.359 kg)  BMI 21.92 kg/m2  SpO2 96% VS noted,  Constitutional: Pt is oriented to person, place, and time. Appears well-developed and well-nourished, in no significant distress Head: Normocephalic and atraumatic.  Right Ear: External ear normal.  Left Ear: External ear normal.  Nose: Nose normal.  Mouth/Throat: Oropharynx is clear and moist.  Eyes: Conjunctivae and EOM are normal. Pupils are equal, round, and reactive to light.  Neck: Normal range of motion. Neck supple. No JVD present. No tracheal deviation present or significant neck LA or mass Cardiovascular: Normal rate, regular rhythm, normal heart sounds and intact distal pulses.   Pulmonary/Chest: Effort normal and breath sounds without rales or wheezing  Abdominal: Soft. Bowel sounds are normal. NT. No HSM  Musculoskeletal: Normal range of motion. Exhibits no edema.  Lymphadenopathy:  Has no cervical adenopathy.  Neurological: Pt is alert and oriented to person, place, and time. Pt has normal reflexes. No cranial nerve deficit. Motor grossly intact Skin: Skin is warm and dry. No rash noted.  Psychiatric:  Has normal mood and affect. Behavior is normal.     Assessment & Plan:

## 2015-10-23 NOTE — Assessment & Plan Note (Signed)
For podiattry referral

## 2015-10-23 NOTE — Assessment & Plan Note (Signed)

## 2015-10-23 NOTE — Progress Notes (Signed)
Pre visit review using our clinic review tool, if applicable. No additional management support is needed unless otherwise documented below in the visit note. 

## 2015-10-24 ENCOUNTER — Other Ambulatory Visit: Payer: Self-pay | Admitting: Internal Medicine

## 2015-10-24 ENCOUNTER — Encounter: Payer: Self-pay | Admitting: Internal Medicine

## 2015-10-24 LAB — HEPATITIS C ANTIBODY: HCV AB: NEGATIVE

## 2015-10-24 MED ORDER — ROSUVASTATIN CALCIUM 40 MG PO TABS: 40.0000 mg | ORAL_TABLET | Freq: Every day | ORAL | Status: DC

## 2015-10-28 ENCOUNTER — Other Ambulatory Visit: Payer: Self-pay | Admitting: Internal Medicine

## 2015-10-29 NOTE — Telephone Encounter (Signed)
Done hardcopy to Dahlia  

## 2015-10-30 NOTE — Telephone Encounter (Signed)
Rx faxed to pharmacy  

## 2015-10-31 ENCOUNTER — Telehealth: Payer: Self-pay | Admitting: Nurse Practitioner

## 2015-10-31 MED ORDER — VALACYCLOVIR HCL 1 G PO TABS: 1000.0000 mg | ORAL_TABLET | Freq: Two times a day (BID) | ORAL | Status: DC

## 2015-10-31 NOTE — Telephone Encounter (Signed)
Rx sent to pharmacy   

## 2015-10-31 NOTE — Telephone Encounter (Signed)
Patient requesting refill of valtrex 1 gm sent to [facility] [address] in [place]. Best # to reach [phone] (Chart on Reina's desk)

## 2015-10-31 NOTE — Telephone Encounter (Signed)
Ok to refill # 30/2

## 2015-10-31 NOTE — Telephone Encounter (Signed)
Medication refill request: Valtrex  Last AEX:  01/12/13 PG Next AEX: 11/28/15 PG Last MMG (if hormonal medication request): 02/18/14 ? Refill authorized: please advise.   Paper chart on your desk

## 2015-11-09 ENCOUNTER — Other Ambulatory Visit: Payer: Self-pay | Admitting: Internal Medicine

## 2015-11-19 ENCOUNTER — Other Ambulatory Visit: Payer: Self-pay | Admitting: Internal Medicine

## 2015-11-19 NOTE — Telephone Encounter (Signed)
lyrica done hardcopy

## 2015-11-19 NOTE — Telephone Encounter (Signed)
Rx faxed to pharmacy  

## 2015-11-28 ENCOUNTER — Ambulatory Visit (INDEPENDENT_AMBULATORY_CARE_PROVIDER_SITE_OTHER): Payer: BLUE CROSS/BLUE SHIELD | Admitting: Nurse Practitioner

## 2015-11-28 ENCOUNTER — Encounter: Payer: Self-pay | Admitting: Nurse Practitioner

## 2015-11-28 VITALS — BP 116/74 | HR 92 | Ht 69.0 in | Wt 147.0 lb

## 2015-11-28 DIAGNOSIS — M533 Sacrococcygeal disorders, not elsewhere classified: Secondary | ICD-10-CM | POA: Diagnosis not present

## 2015-11-28 DIAGNOSIS — A6000 Herpesviral infection of urogenital system, unspecified: Secondary | ICD-10-CM

## 2015-11-28 DIAGNOSIS — M8580 Other specified disorders of bone density and structure, unspecified site: Secondary | ICD-10-CM | POA: Diagnosis not present

## 2015-11-28 DIAGNOSIS — G8929 Other chronic pain: Secondary | ICD-10-CM

## 2015-11-28 DIAGNOSIS — Z0000 Encounter for general adult medical examination without abnormal findings: Secondary | ICD-10-CM

## 2015-11-28 DIAGNOSIS — Z01419 Encounter for gynecological examination (general) (routine) without abnormal findings: Secondary | ICD-10-CM | POA: Diagnosis not present

## 2015-11-28 MED ORDER — AZITHROMYCIN 250 MG PO TABS: ORAL_TABLET | ORAL | Status: DC

## 2015-11-28 NOTE — Progress Notes (Signed)
Patient ID: Christina Waller, female   DOB: 06-Mar-1956, 59 y.o.   MRN: [id number]  59 y.o. [id number] Divorced Caucasian Fe here for annual exam.  No new health problems. Same partner for 6 years.  Now engaged and plans to move back to [place] in the spring.  Still lives with daughter and helps with 2 grandchildren. No further problems with right lower quadrant pain.  She had negative PUS and CT scan.  She always has problems with right SI joint pain.  Patient's last menstrual period was 07/13/2006 (approximate).          Sexually active: Yes.    The current method of family planning is tubal ligation.    Exercising: No regular exercise, but takes care of 60 yo granddaughter Smoker:  no  Health Maintenance: Pap:  01/12/13, Negative with + HR HPV, + 16/18; Colpo 02/09/13, Neg ECC - no repeat pap MMG:  03/09/13, Bi-Rads 1:  Negative Colonoscopy:  10/26/04, Normal, repeat in 8 years; flex sig 02/12/10, Colitis BMD:   03/09/13, T Score -0.6 spine / -1.8 Left Femur Neck TDaP:  03/16/06 Shingles: Never  Pneumonia: Never Hep C and HIV: Hep C 10/2015 Labs: PCP in [facility] 10/2015   reports that she has never smoked. She has never used smokeless tobacco. She reports that she drinks about 1.8 oz of alcohol per week. She reports that she does not use illicit drugs.  Past Medical History  Diagnosis Date  . Depression   . Anxiety   . Hyperlipidemia   . Anemia     NOS  . Headache(784.0)   . Migraines   . Restless leg syndrome   . Fibromyalgia   . Allergic rhinitis   . Osteopenia   . Unspecified vitamin D deficiency   . HNP (herniated nucleus pulposus), lumbar     L 2-3  . STD (sexually transmitted disease)     HSV that outbreaks mid back    Past Surgical History  Procedure Laterality Date  . Tubal ligation      bilat  . Colonoscopy  11/05    normal recheck in 8 years  . Breast surgery    . Augmentation mammaplasty Bilateral 5/01    saline    Current Outpatient Prescriptions  Medication Sig  Dispense Refill  . clonazePAM (KLONOPIN) 0.5 MG tablet TAKE ONE TABLET BY MOUTH IN THE MORNING AND ONE TO TWO TABLETS EVERY NIGHT AT BEDTIME AS NEEDED 60 tablet 5  . DULoxetine (CYMBALTA) 30 MG capsule TAKE ONE CAPSULE BY MOUTH TWICE DAILY 180 capsule 0  . LYRICA 50 MG capsule TAKE TWO CAPSULES BY MOUTH THREE TIMES DAILY 270 capsule 3  . rosuvastatin (CRESTOR) 40 MG tablet Take 1 tablet (40 mg total) by mouth daily. 90 tablet 3  . SUMAtriptan (IMITREX) 100 MG tablet TAKE ONE TABLET BY MOUTH EVERY 2 HOURS AS NEEDED FOR MIGRAINE. 17 tablet 0  . traMADol (ULTRAM) 50 MG tablet TAKE ONE TABLET BY MOUTH EVERY 6 HOURS AS NEEDED FOR PAIN 60 tablet 2  . valACYclovir (VALTREX) 1000 MG tablet Take 1 tablet (1,000 mg total) by mouth 2 (two) times daily. 30 tablet 2  . azithromycin (ZITHROMAX) 250 MG tablet Take two tabs today and then 1 tablet daily as directed 6 tablet 0   No current facility-administered medications for this visit.    Family History  Problem Relation Age of Onset  . Colon cancer Neg Hx   . Ovarian cancer Mother 23  . Osteoarthritis Mother   .  Depression Mother   . Heart disease Father   . Depression Sister   . Hypertension Maternal Grandmother   . Osteoporosis Maternal Grandmother   . Ulcers Maternal Grandmother   . Heart disease Maternal Grandfather     ROS:  Pertinent items are noted in HPI.  Otherwise, a comprehensive ROS was negative.  Exam:   BP 116/74 mmHg  Pulse 92  Ht 5\' 9"  (1.753 m)  Wt 147 lb (66.679 kg)  BMI 21.70 kg/m2  LMP 07/13/2006 (Approximate) Height: 5\' 9"  (175.3 cm) Ht Readings from Last 3 Encounters:  11/28/15 5\' 9"  (1.753 m)  10/23/15 5\' 9"  (1.753 m)  09/13/14 5\' 9"  (1.753 m)    General appearance: alert, cooperative and appears stated age Head: Normocephalic, without obvious abnormality, atraumatic Neck: no adenopathy, supple, symmetrical, trachea midline and thyroid normal to inspection and palpation Lungs: clear to auscultation  bilaterally Breasts: normal appearance, no masses or tenderness Heart: regular rate and rhythm Abdomen: soft, non-tender; no masses,  no organomegaly Extremities: extremities normal, atraumatic, no cyanosis or edema Skin: Skin color, texture, turgor normal. No rashes or lesions Lymph nodes: Cervical, supraclavicular, and axillary nodes normal. No abnormal inguinal nodes palpated Neurologic: Grossly normal   Pelvic: External genitalia:  no lesions              Urethra:  normal appearing urethra with no masses, tenderness or lesions              Bartholin's and Skene's: normal                 Vagina: normal appearing vagina with normal color and discharge, no lesions              Cervix: anteverted              Pap taken: Yes.   Bimanual Exam:  Uterus:  normal size, contour, position, consistency, mobility, non-tender              Adnexa: no mass, fullness, tenderness               Rectovaginal: Confirms               Anus:  normal sphincter tone, no lesions  Chaperone present: yes  A:  Well Woman with normal exam  Postmenopausal no HRT  History of normal pap with + HR HPV and Colpo biopsy done 02/09/13 that was negative but did not have a follow up pap.  History of anxiety and depression  Hypercholesteremia, osteopenia, fibromyalgia  History of HSV   P:   Reviewed health and wellness pertinent to exam  Pap smear as above  Mammogram is due and will schedule - added BMD to be done at same time  Will follow with pap  Counseled on breast self exam, mammography screening, adequate intake of calcium and vitamin D, diet and exercise return annually or prn  An After Visit Summary was printed and given to the patient.

## 2015-11-28 NOTE — Patient Instructions (Signed)

## 2015-12-02 LAB — IPS PAP TEST WITH HPV

## 2015-12-02 NOTE — Progress Notes (Signed)
Encounter reviewed Christina Massman, MD   

## 2015-12-10 ENCOUNTER — Other Ambulatory Visit: Payer: Self-pay | Admitting: Internal Medicine

## 2015-12-10 NOTE — Telephone Encounter (Signed)
imitrex done erx

## 2016-01-01 ENCOUNTER — Ambulatory Visit: Payer: BLUE CROSS/BLUE SHIELD | Admitting: Podiatry

## 2016-01-01 ENCOUNTER — Other Ambulatory Visit: Payer: Self-pay | Admitting: Internal Medicine

## 2016-01-12 ENCOUNTER — Other Ambulatory Visit: Payer: Self-pay | Admitting: Internal Medicine

## 2016-01-13 ENCOUNTER — Other Ambulatory Visit: Payer: Self-pay | Admitting: Internal Medicine

## 2016-01-13 NOTE — Telephone Encounter (Signed)
Patient would like refill on tramadol, please advise.

## 2016-01-13 NOTE — Telephone Encounter (Signed)
Done hardcopy to Corinne  

## 2016-01-13 NOTE — Telephone Encounter (Signed)
Medication printed signed and faxed

## 2016-03-12 ENCOUNTER — Telehealth: Payer: Self-pay | Admitting: *Deleted

## 2016-03-12 MED ORDER — ROSUVASTATIN CALCIUM 40 MG PO TABS: 40.0000 mg | ORAL_TABLET | Freq: Every day | ORAL | Status: DC

## 2016-03-12 NOTE — Telephone Encounter (Signed)
Left msg on triage yesterday afternoon stating MD wanted herto take crestor for her cholesterol. Rx was sent to them back in November per pharmacy they do not have a record of it. Pt would like rx sent to [facility] in [place]...Johny Chess

## 2016-03-29 ENCOUNTER — Other Ambulatory Visit: Payer: Self-pay | Admitting: Internal Medicine

## 2016-04-30 ENCOUNTER — Telehealth: Payer: Self-pay | Admitting: *Deleted

## 2016-04-30 MED ORDER — CLONAZEPAM 0.5 MG PO TABS: ORAL_TABLET | ORAL | Status: DC

## 2016-04-30 NOTE — Telephone Encounter (Signed)
Done hardcopy to Corinne  

## 2016-04-30 NOTE — Telephone Encounter (Signed)
Left msg on triage requesting refills on her Clonazepam.../lmb

## 2016-04-30 NOTE — Telephone Encounter (Signed)
Script fax to [facility]...Christina Waller

## 2016-05-03 ENCOUNTER — Ambulatory Visit (HOSPITAL_COMMUNITY)
Admission: EM | Admit: 2016-05-03 | Discharge: 2016-05-03 | Disposition: A | Discharge status: Home / Self Care | Payer: BLUE CROSS/BLUE SHIELD | Attending: Family Medicine | Admitting: Family Medicine

## 2016-05-03 ENCOUNTER — Ambulatory Visit (INDEPENDENT_AMBULATORY_CARE_PROVIDER_SITE_OTHER): Payer: BLUE CROSS/BLUE SHIELD

## 2016-05-03 ENCOUNTER — Encounter (HOSPITAL_COMMUNITY): Payer: Self-pay | Admitting: Nurse Practitioner

## 2016-05-03 DIAGNOSIS — J069 Acute upper respiratory infection, unspecified: Secondary | ICD-10-CM

## 2016-05-03 MED ORDER — IPRATROPIUM BROMIDE 0.06 % NA SOLN: 2.0000 | Freq: Four times a day (QID) | NASAL | Status: DC

## 2016-05-03 MED ORDER — AZITHROMYCIN 250 MG PO TABS: ORAL_TABLET | ORAL | Status: DC

## 2016-05-03 NOTE — ED Provider Notes (Signed)
CSN: [id number]     Arrival date & time 05/03/16  1255 History   First MD Initiated Contact with Patient 05/03/16 1317     Chief Complaint  Patient presents with  . URI   (Consider location/radiation/quality/duration/timing/severity/associated sxs/prior Treatment) Patient is a 60 y.o. female presenting with URI. The history is provided by the patient.  URI Presenting symptoms: congestion, cough and rhinorrhea   Presenting symptoms: no fever   Severity:  Mild Onset quality:  Gradual Duration:  1 week Progression:  Unchanged Chronicity:  New Relieved by:  None tried Worsened by:  Nothing tried Ineffective treatments:  None tried Associated symptoms: no wheezing     Past Medical History  Diagnosis Date  . Depression   . Anxiety   . Hyperlipidemia   . Anemia     NOS  . Headache(784.0)   . Migraines   . Restless leg syndrome   . Fibromyalgia   . Allergic rhinitis   . Osteopenia   . Unspecified vitamin D deficiency   . HNP (herniated nucleus pulposus), lumbar     L 2-3  . STD (sexually transmitted disease)     HSV that outbreaks mid back   Past Surgical History  Procedure Laterality Date  . Tubal ligation      bilat  . Colonoscopy  11/05    normal recheck in 8 years  . Breast surgery    . Augmentation mammaplasty Bilateral 5/01    saline   Family History  Problem Relation Age of Onset  . Colon cancer Neg Hx   . Ovarian cancer Mother 21  . Osteoarthritis Mother   . Depression Mother   . Heart disease Father   . Depression Sister   . Hypertension Maternal Grandmother   . Osteoporosis Maternal Grandmother   . Ulcers Maternal Grandmother   . Heart disease Maternal Grandfather    Social History  Substance Use Topics  . Smoking status: Never Smoker   . Smokeless tobacco: Never Used  . Alcohol Use: 1.8 oz/week    3 Standard drinks or equivalent per week   OB History    Gravida Para Term Preterm AB TAB SAB Ectopic Multiple Living   3 3 3  0 0 0 0 0 0 3      Review of Systems  Constitutional: Negative.  Negative for fever.  HENT: Positive for congestion and rhinorrhea.   Respiratory: Positive for cough. Negative for shortness of breath and wheezing.   Cardiovascular: Negative.   All other systems reviewed and are negative.   Allergies  Review of patient's allergies indicates no known allergies.  Home Medications   Prior to Admission medications   Medication Sig Start Date End Date Taking? Authorizing Provider  azithromycin (ZITHROMAX) 250 MG tablet Take two tabs today and then 1 tablet daily as directed 11/28/15   Kem Boroughs, FNP  clonazePAM (KLONOPIN) 0.5 MG tablet TAKE ONE TABLET BY MOUTH IN THE MORNING AND ONE TO TWO TABLETS EVERY NIGHT AT BEDTIME AS NEEDED 04/30/16   Biagio Borg, MD  DULoxetine (CYMBALTA) 30 MG capsule TAKE ONE CAPSULE BY MOUTH TWICE DAILY 10/07/15   Biagio Borg, MD  DULoxetine (CYMBALTA) 30 MG capsule TAKE ONE CAPSULE BY MOUTH TWICE DAILY. 03/29/16   Biagio Borg, MD  LYRICA 50 MG capsule TAKE TWO CAPSULES BY MOUTH THREE TIMES DAILY 11/19/15   Biagio Borg, MD  rosuvastatin (CRESTOR) 40 MG tablet Take 1 tablet (40 mg total) by mouth daily. 03/12/16   Jeneen Rinks  Quin Hoop, MD  SUMAtriptan (IMITREX) 100 MG tablet TAKE ONE TABLET BY MOUTH EVERY 2 HOURS AS NEEDED FOR MIGRAINE. 12/10/15   Biagio Borg, MD  traMADol (ULTRAM) 50 MG tablet TAKE 1 TABLET BY MOUTH EVERY 6 HOURS AS NEEDED FOR PAIN. 01/13/16   Biagio Borg, MD  valACYclovir (VALTREX) 1000 MG tablet Take 1 tablet (1,000 mg total) by mouth 2 (two) times daily. 10/31/15   Kem Boroughs, FNP   Meds Ordered and Administered this Visit  Medications - No data to display  BP 127/80 mmHg  Pulse 89  Temp(Src) 98.2 F (36.8 C) (Oral)  Resp 12  SpO2 99%  LMP 07/13/2006 (Approximate) No data found.   Physical Exam  Constitutional: She is oriented to person, place, and time. She appears well-developed and well-nourished. No distress.  HENT:  Right Ear: External ear  normal.  Left Ear: External ear normal.  Mouth/Throat: Oropharynx is clear and moist.  Neck: Normal range of motion. Neck supple.  Cardiovascular: Normal rate, normal heart sounds and intact distal pulses.   Pulmonary/Chest: Effort normal and breath sounds normal.  Lymphadenopathy:    She has no cervical adenopathy.  Neurological: She is alert and oriented to person, place, and time.  Skin: Skin is warm and dry.  Nursing note and vitals reviewed.   ED Course  Procedures (including critical care time)  Labs Review Labs Reviewed - No data to display  Imaging Review No results found.  X-rays reviewed and report per radiologist.  Visual Acuity Review  Right Eye Distance:   Left Eye Distance:   Bilateral Distance:    Right Eye Near:   Left Eye Near:    Bilateral Near:         MDM  No diagnosis found.  Meds ordered this encounter  Medications  . ipratropium (ATROVENT) 0.06 % nasal spray    Sig: Place 2 sprays into both nostrils 4 (four) times daily.    Dispense:  15 mL    Refill:  1  . azithromycin (ZITHROMAX Z-PAK) 250 MG tablet    Sig: Take as directed on pack    Dispense:  6 tablet    Refill:  0      Billy Fischer, MD 05/03/16 1352

## 2016-05-03 NOTE — ED Notes (Signed)
Pt c/o 1 week history cough, congestion, sore throat, loss of voice. She denies fevers. She tried OTC delsym, decongestant with no relief. She is alert and breathing easily

## 2016-05-03 NOTE — Discharge Instructions (Signed)
Drink plenty of fluids as discussed, use medicine as prescribed, and mucinex or delsym for cough. Return or see your doctor if further problems °

## 2016-05-26 ENCOUNTER — Telehealth: Payer: Self-pay

## 2016-05-26 ENCOUNTER — Encounter: Payer: Self-pay | Admitting: Family

## 2016-05-26 ENCOUNTER — Ambulatory Visit (INDEPENDENT_AMBULATORY_CARE_PROVIDER_SITE_OTHER)
Admission: RE | Admit: 2016-05-26 | Discharge: 2016-05-26 | Disposition: A | Discharge status: Home / Self Care | Payer: BLUE CROSS/BLUE SHIELD | Source: Ambulatory Visit | Attending: Family | Admitting: Family

## 2016-05-26 ENCOUNTER — Ambulatory Visit (INDEPENDENT_AMBULATORY_CARE_PROVIDER_SITE_OTHER): Payer: BLUE CROSS/BLUE SHIELD | Admitting: Family

## 2016-05-26 VITALS — BP 130/80 | HR 90 | Temp 99.7°F | Ht 69.0 in | Wt 141.0 lb

## 2016-05-26 DIAGNOSIS — R05 Cough: Secondary | ICD-10-CM

## 2016-05-26 DIAGNOSIS — R059 Cough, unspecified: Secondary | ICD-10-CM

## 2016-05-26 MED ORDER — PROMETHAZINE-DM 6.25-15 MG/5ML PO SYRP: 5.0000 mL | ORAL_SOLUTION | Freq: Every evening | ORAL | Status: DC | PRN

## 2016-05-26 MED ORDER — ALBUTEROL SULFATE HFA 108 (90 BASE) MCG/ACT IN AERS: 2.0000 | INHALATION_SPRAY | Freq: Four times a day (QID) | RESPIRATORY_TRACT | Status: DC | PRN

## 2016-05-26 NOTE — Progress Notes (Signed)
Subjective:    Patient ID: Christina Waller, female    DOB: 03/05/1956, 60 y.o.   MRN: [id number]   Christina Waller is a 60 y.o. female who presents today for an acute visit.    HPI Comments: Patient here for evaluation of congestion, cough which started 3 weeks ago. She was treated with azithromycin and atrovent nasal spray in the ED 5/22, with resolution. CXR negative. Last night chest congestion, loss of voice, and cough returned. H/o seasonal allergies. Has had PNA 'couple of winters in a row'. No SOB, chills. Endorses wheezing.    Past Medical History  Diagnosis Date  . Depression   . Anxiety   . Hyperlipidemia   . Anemia     NOS  . Headache(784.0)   . Migraines   . Restless leg syndrome   . Fibromyalgia   . Allergic rhinitis   . Osteopenia   . Unspecified vitamin D deficiency   . HNP (herniated nucleus pulposus), lumbar     L 2-3  . STD (sexually transmitted disease)     HSV that outbreaks mid back   Allergies: Review of patient's allergies indicates no known allergies. Current Outpatient Prescriptions on File Prior to Visit  Medication Sig Dispense Refill  . clonazePAM (KLONOPIN) 0.5 MG tablet TAKE ONE TABLET BY MOUTH IN THE MORNING AND ONE TO TWO TABLETS EVERY NIGHT AT BEDTIME AS NEEDED 60 tablet 5  . DULoxetine (CYMBALTA) 30 MG capsule TAKE ONE CAPSULE BY MOUTH TWICE DAILY 180 capsule 0  . ipratropium (ATROVENT) 0.06 % nasal spray Place 2 sprays into both nostrils 4 (four) times daily. 15 mL 1  . LYRICA 50 MG capsule TAKE TWO CAPSULES BY MOUTH THREE TIMES DAILY 270 capsule 3  . rosuvastatin (CRESTOR) 40 MG tablet Take 1 tablet (40 mg total) by mouth daily. 90 tablet 2  . SUMAtriptan (IMITREX) 100 MG tablet TAKE ONE TABLET BY MOUTH EVERY 2 HOURS AS NEEDED FOR MIGRAINE. 17 tablet 5  . traMADol (ULTRAM) 50 MG tablet TAKE 1 TABLET BY MOUTH EVERY 6 HOURS AS NEEDED FOR PAIN. 60 tablet 2  . valACYclovir (VALTREX) 1000 MG tablet Take 1 tablet (1,000 mg total) by mouth 2 (two)  times daily. (Patient not taking: Reported on 05/26/2016) 30 tablet 2   No current facility-administered medications on file prior to visit.    Social History  Substance Use Topics  . Smoking status: Never Smoker   . Smokeless tobacco: Never Used  . Alcohol Use: 1.8 oz/week    3 Standard drinks or equivalent per week    Review of Systems  Constitutional: Negative for fever and chills.  HENT: Positive for voice change. Negative for ear pain, sinus pressure and sore throat.   Respiratory: Positive for shortness of breath and wheezing. Negative for cough.   Cardiovascular: Negative for chest pain and palpitations.  Gastrointestinal: Negative for nausea and vomiting.      Objective:    BP 130/80 mmHg  Pulse 90  Temp(Src) 99.7 F (37.6 C) (Oral)  Ht 5\' 9"  (1.753 m)  Wt 141 lb (63.957 kg)  BMI 20.81 kg/m2  SpO2 97%  LMP 07/13/2006 (Approximate)   Physical Exam  Constitutional: She appears well-developed and well-nourished.  HENT:  Head: Normocephalic and atraumatic.  Right Ear: Hearing, tympanic membrane, external ear and ear canal normal. No drainage, swelling or tenderness. No foreign bodies. Tympanic membrane is not erythematous and not bulging. No middle ear effusion. No decreased hearing is noted.  Left Ear:  Hearing, tympanic membrane, external ear and ear canal normal. No drainage, swelling or tenderness. No foreign bodies. Tympanic membrane is not erythematous and not bulging.  No middle ear effusion. No decreased hearing is noted.  Nose: Rhinorrhea present. Right sinus exhibits no maxillary sinus tenderness and no frontal sinus tenderness. Left sinus exhibits no maxillary sinus tenderness and no frontal sinus tenderness.  Mouth/Throat: Uvula is midline and mucous membranes are normal. Posterior oropharyngeal erythema present. No oropharyngeal exudate, posterior oropharyngeal edema or tonsillar abscesses.  Eyes: Conjunctivae are normal.  Cardiovascular: Regular rhythm,  normal heart sounds and normal pulses.   Pulmonary/Chest: Effort normal and breath sounds normal. She has no wheezes. She has no rhonchi. She has no rales.  Lymphadenopathy:       Head (right side): No submental, no submandibular, no tonsillar, no preauricular, no posterior auricular and no occipital adenopathy present.       Head (left side): No submental, no submandibular, no tonsillar, no preauricular, no posterior auricular and no occipital adenopathy present.    She has no cervical adenopathy.  Neurological: She is alert.  Skin: Skin is warm and dry.  Psychiatric: She has a normal mood and affect. Her speech is normal and behavior is normal. Thought content normal.  Vitals reviewed.      Assessment & Plan:   1. Cough Low-grade fever. No acute respiratory distress. Pending chest x-ray to evaluate for pneumonia.  - albuterol (PROVENTIL HFA) 108 (90 Base) MCG/ACT inhaler; Inhale 2 puffs into the lungs every 6 (six) hours as needed for wheezing or shortness of breath.  Dispense: 1 Inhaler; Refill: 1 - DG Chest 2 View - promethazine-dextromethorphan (PROMETHAZINE-DM) 6.25-15 MG/5ML syrup; Take 5 mLs by mouth at bedtime as needed for cough.  Dispense: 40 mL; Refill: 1    I have discontinued Ms. Fretwell's azithromycin. I am also having her maintain her DULoxetine, valACYclovir, LYRICA, SUMAtriptan, traMADol, rosuvastatin, clonazePAM, and ipratropium.   No orders of the defined types were placed in this encounter.     Start medications as prescribed and explained to patient on After Visit Summary ( AVS). Risks, benefits, and alternatives of the medications and treatment plan prescribed today were discussed, and patient expressed understanding.   Education regarding symptom management and diagnosis given to patient.   Follow-up:Plan follow-up and return precautions given if any worsening symptoms or change in condition.   Continue to follow with Cathlean Cower, MD for routine health  maintenance.   Candis Musa Cervenka and I agreed with plan.   Mable Paris, FNP

## 2016-05-26 NOTE — Progress Notes (Signed)
Pre visit review using our clinic review tool, if applicable. No additional management support is needed unless otherwise documented below in the visit note. 

## 2016-05-26 NOTE — Patient Instructions (Signed)

## 2016-05-27 NOTE — Telephone Encounter (Signed)
Opened in error

## 2016-06-25 ENCOUNTER — Other Ambulatory Visit: Payer: Self-pay | Admitting: Internal Medicine

## 2016-07-02 ENCOUNTER — Other Ambulatory Visit: Payer: Self-pay | Admitting: Internal Medicine

## 2016-07-02 NOTE — Telephone Encounter (Signed)
Please advise 

## 2016-07-02 NOTE — Telephone Encounter (Signed)
Done hardcopy to Corinne  

## 2016-07-03 NOTE — Telephone Encounter (Signed)
Medication refill faxed to pharmacy

## 2016-08-27 ENCOUNTER — Other Ambulatory Visit: Payer: Self-pay | Admitting: Internal Medicine

## 2016-08-27 NOTE — Telephone Encounter (Signed)
Done hardcopy to Corinne  

## 2016-08-27 NOTE — Telephone Encounter (Signed)
Script faxed back to [facility].../lmb 

## 2016-10-04 ENCOUNTER — Other Ambulatory Visit: Payer: Self-pay | Admitting: Internal Medicine

## 2016-10-26 ENCOUNTER — Other Ambulatory Visit: Payer: Self-pay | Admitting: Internal Medicine

## 2016-10-29 ENCOUNTER — Encounter: Payer: BLUE CROSS/BLUE SHIELD | Admitting: Internal Medicine

## 2016-11-07 ENCOUNTER — Other Ambulatory Visit: Payer: Self-pay | Admitting: Internal Medicine

## 2016-11-09 ENCOUNTER — Ambulatory Visit (INDEPENDENT_AMBULATORY_CARE_PROVIDER_SITE_OTHER): Payer: BLUE CROSS/BLUE SHIELD | Admitting: Internal Medicine

## 2016-11-09 ENCOUNTER — Encounter: Payer: Self-pay | Admitting: Internal Medicine

## 2016-11-09 ENCOUNTER — Other Ambulatory Visit: Payer: Self-pay | Admitting: Internal Medicine

## 2016-11-09 ENCOUNTER — Other Ambulatory Visit (INDEPENDENT_AMBULATORY_CARE_PROVIDER_SITE_OTHER): Payer: BLUE CROSS/BLUE SHIELD

## 2016-11-09 VITALS — BP 128/72 | HR 98 | Temp 98.8°F | Resp 20 | Wt 142.0 lb

## 2016-11-09 DIAGNOSIS — Z299 Encounter for prophylactic measures, unspecified: Secondary | ICD-10-CM

## 2016-11-09 DIAGNOSIS — Z0001 Encounter for general adult medical examination with abnormal findings: Secondary | ICD-10-CM

## 2016-11-09 DIAGNOSIS — R05 Cough: Secondary | ICD-10-CM

## 2016-11-09 DIAGNOSIS — Z23 Encounter for immunization: Secondary | ICD-10-CM | POA: Diagnosis not present

## 2016-11-09 DIAGNOSIS — R059 Cough, unspecified: Secondary | ICD-10-CM

## 2016-11-09 DIAGNOSIS — J309 Allergic rhinitis, unspecified: Secondary | ICD-10-CM

## 2016-11-09 LAB — URINALYSIS, ROUTINE W REFLEX MICROSCOPIC
Bilirubin Urine: NEGATIVE
KETONES UR: NEGATIVE
LEUKOCYTES UA: NEGATIVE
NITRITE: NEGATIVE
RBC / HPF: NONE SEEN (ref 0–?)
Specific Gravity, Urine: <=1.005 — AB (ref 1.000–1.030)
TOTAL PROTEIN, URINE-UPE24: NEGATIVE
URINE GLUCOSE: NEGATIVE
UROBILINOGEN UA: 0.2 (ref 0.0–1.0)
pH: 5.5 (ref 5.0–8.0)

## 2016-11-09 LAB — BASIC METABOLIC PANEL
BUN: 17 mg/dL (ref 6–23)
CO2: 29 mEq/L (ref 19–32)
CREATININE: 0.92 mg/dL (ref 0.40–1.20)
Calcium: 9.6 mg/dL (ref 8.4–10.5)
Chloride: 105 mEq/L (ref 96–112)
GFR: 65.91 mL/min (ref >60.00)
Glucose, Bld: 86 mg/dL (ref 70–99)
Potassium: 4.2 mEq/L (ref 3.5–5.1)
Sodium: 142 mEq/L (ref 135–145)

## 2016-11-09 LAB — LIPID PANEL
CHOL/HDL RATIO: 3
Cholesterol: 224 mg/dL — ABNORMAL HIGH (ref 0–200)
HDL: 73.6 mg/dL (ref >39.00)
LDL CALC: 128 mg/dL — AB (ref 0–99)
NonHDL: 150.66
TRIGLYCERIDES: 113 mg/dL (ref 0.0–149.0)
VLDL: 22.6 mg/dL (ref 0.0–40.0)

## 2016-11-09 LAB — HEPATIC FUNCTION PANEL
ALBUMIN: 4.3 g/dL (ref 3.5–5.2)
ALT: 16 U/L (ref 0–35)
AST: 22 U/L (ref 0–37)
Alkaline Phosphatase: 67 U/L (ref 39–117)
BILIRUBIN TOTAL: 0.4 mg/dL (ref 0.2–1.2)
Bilirubin, Direct: 0 mg/dL (ref 0.0–0.3)
Total Protein: 7.4 g/dL (ref 6.0–8.3)

## 2016-11-09 LAB — TSH: TSH: 1.25 u[IU]/mL (ref 0.35–4.50)

## 2016-11-09 MED ORDER — PREDNISONE 10 MG PO TABS: ORAL_TABLET | ORAL | 0 refills | Status: DC

## 2016-11-09 MED ORDER — TRIAMCINOLONE ACETONIDE 55 MCG/ACT NA AERO: 2.0000 | INHALATION_SPRAY | Freq: Every day | NASAL | 12 refills | Status: DC

## 2016-11-09 MED ORDER — CETIRIZINE HCL 10 MG PO TABS: 10.0000 mg | ORAL_TABLET | Freq: Every day | ORAL | 11 refills | Status: DC

## 2016-11-09 NOTE — Patient Instructions (Addendum)
You had the flu shot today, and the Tetanus shot (Tdap)  You had the steroid shot today  Please take all new medication as prescribed  - the prednisone, as well as the zyrtec and nasacort for allergies (and hopefully help with the cough)  You will be contacted regarding the referral for: Cologuard  Please continue all other medications as before, and refills have been done if requested.  Please have the pharmacy call with any other refills you may need.  Please continue your efforts at being more active, low cholesterol diet, and weight control.  You are otherwise up to date with prevention measures today.  Please keep your appointments with your specialists as you may have planned  Please go to the LAB in the Basement (turn left off the elevator) for the tests to be done today  You will be contacted by phone if any changes need to be made immediately.  Otherwise, you will receive a letter about your results with an explanation, but please check with MyChart first.  Please remember to sign up for MyChart if you have not done so, as this will be important to you in the future with finding out test results, communicating by private email, and scheduling acute appointments online when needed.  Please return in 1 year for your yearly visit, or sooner if needed, with Lab testing done 3-5 days before

## 2016-11-09 NOTE — Telephone Encounter (Signed)
Done hardcopy to Corinne  

## 2016-11-09 NOTE — Progress Notes (Signed)
Pre visit review using our clinic review tool, if applicable. No additional management support is needed unless otherwise documented below in the visit note. 

## 2016-11-09 NOTE — Progress Notes (Signed)
Subjective:    Patient ID: Christina Waller, female    DOB: 06/03/1956, 60 y.o.   MRN: [id number]  HPI  Here for wellness and f/u;  Overall doing ok;  Pt denies Chest pain, worsening SOB, DOE, wheezing, orthopnea, PND, worsening LE edema, palpitations, dizziness or syncope.  Pt denies neurological change such as new headache, facial or extremity weakness.  Pt denies polydipsia, polyuria, or low sugar symptoms. Pt states overall good compliance with treatment and medications, good tolerability, and has been trying to follow appropriate diet.  Pt denies worsening depressive symptoms, suicidal ideation or panic. No fever, night sweats, wt loss, loss of appetite, or other constitutional symptoms.  Pt states good ability with ADL's, has low fall risk, home safety reviewed and adequate, no other significant changes in hearing or vision, and only occasionally active with exercise. Wt Readings from Last 3 Encounters:  11/09/16 142 lb (64.4 kg)  05/26/16 141 lb (64 kg)  11/28/15 147 lb (66.7 kg)  Has appt jan 2018 with GYN for pap and mammogram  Also c/o 1 yr Chronic cough, greenish prod, worse to lie down, some worse in freq and severity, no blood, Does have several wks ongoing nasal allergy symptoms with clearish congestion, itch and sneezing, without fever, pain, ST, cough, swelling or wheezing. No overt reflux or wheezing recent .  Did have cold symtpoms 2 wks ago, made it worse recent.   Past Medical History:  Diagnosis Date  . Allergic rhinitis   . Anemia    NOS  . Anxiety   . Depression   . Fibromyalgia   . Headache(784.0)   . HNP (herniated nucleus pulposus), lumbar    L 2-3  . Hyperlipidemia   . Migraines   . Osteopenia   . Restless leg syndrome   . STD (sexually transmitted disease)    HSV that outbreaks mid back  . Unspecified vitamin D deficiency    Past Surgical History:  Procedure Laterality Date  . AUGMENTATION MAMMAPLASTY Bilateral 5/01   saline  . BREAST SURGERY    .  COLONOSCOPY  11/05   normal recheck in 8 years  . TUBAL LIGATION     bilat    reports that she has never smoked. She has never used smokeless tobacco. She reports that she drinks about 1.8 oz of alcohol per week . She reports that she does not use drugs. family history includes Depression in her mother and sister; Heart disease in her father and maternal grandfather; Hypertension in her maternal grandmother; Osteoarthritis in her mother; Osteoporosis in her maternal grandmother; Ovarian cancer (age of onset: 48) in her mother; Ulcers in her maternal grandmother. No Known Allergies Current Outpatient Prescriptions on File Prior to Visit  Medication Sig Dispense Refill  . albuterol (PROVENTIL HFA) 108 (90 Base) MCG/ACT inhaler Inhale 2 puffs into the lungs every 6 (six) hours as needed for wheezing or shortness of breath. 1 Inhaler 1  . DULoxetine (CYMBALTA) 30 MG capsule TAKE ONE CAPSULE BY MOUTH TWICE DAILY 180 capsule 0  . DULoxetine (CYMBALTA) 30 MG capsule TAKE ONE CAPSULE BY MOUTH TWICE DAILY. 180 capsule 0  . rosuvastatin (CRESTOR) 40 MG tablet Take 1 tablet (40 mg total) by mouth daily. 90 tablet 2  . SUMAtriptan (IMITREX) 100 MG tablet TAKE 1 TABLET BY MOUTH EVERY 2 HOURS AS NEEDED FOR MIGRAINE 85 tablet 0  . traMADol (ULTRAM) 50 MG tablet TAKE 1 TABLET BY MOUTH EVERY 6 HOURS AS NEEDED FOR PAIN 60 tablet  2  . valACYclovir (VALTREX) 1000 MG tablet Take 1 tablet (1,000 mg total) by mouth 2 (two) times daily. 30 tablet 2  . clonazePAM (KLONOPIN) 0.5 MG tablet TAKE 1 TABLET BY MOUTH EVERY MORNING AND 1 TO 2 TABLETS EVERY NIGHT AT BEDTIME AS NEEDED 60 tablet 5  . LYRICA 50 MG capsule TAKE 2 CAPSULE BY MOUTH THREE TIMES DAILY 90 capsule 5   No current facility-administered medications on file prior to visit.     Review of Systems Constitutional: Negative for increased diaphoresis, or other activity, appetite or siginficant weight change other than noted HENT: Negative for worsening hearing  loss, ear pain, facial swelling, mouth sores and neck stiffness.   Eyes: Negative for other worsening pain, redness or visual disturbance.  Respiratory: Negative for choking or stridor Cardiovascular: Negative for other chest pain and palpitations.  Gastrointestinal: Negative for worsening diarrhea, blood in stool, or abdominal distention Genitourinary: Negative for hematuria, flank pain or change in urine volume.  Musculoskeletal: Negative for myalgias or other joint complaints.  Skin: Negative for other color change and wound or drainage.  Neurological: Negative for syncope and numbness. other than noted Hematological: Negative for adenopathy. or other swelling Psychiatric/Behavioral: Negative for hallucinations, SI, self-injury, decreased concentration or other worsening agitation.  All other system neg per pt    Objective:   Physical Exam BP 128/72   Pulse 98   Temp 98.8 F (37.1 C) (Oral)   Resp 20   Wt 142 lb (64.4 kg)   LMP 07/13/2006 (Approximate)   SpO2 99%   BMI 20.97 kg/m  VS noted,  Constitutional: Pt is oriented to person, place, and time. Appears well-developed and well-nourished, in no significant distress Head: Normocephalic and atraumatic  Eyes: Conjunctivae and EOM are normal. Pupils are equal, round, and reactive to light Right Ear: External ear normal.  Left Ear: External ear normal Nose: Nose normal.  Bilat tm's with mild erythema.  Max sinus areas non tender.  Pharynx with mild erythema, no exudate Mouth/Throat: Oropharynx is clear and moist  Neck: Normal range of motion. Neck supple. No JVD present. No tracheal deviation present or significant neck LA or mass Cardiovascular: Normal rate, regular rhythm, normal heart sounds and intact distal pulses.   Pulmonary/Chest: Effort normal and breath sounds without rales or wheezing  Abdominal: Soft. Bowel sounds are normal. NT. No HSM  Musculoskeletal: Normal range of motion. Exhibits no edema Lymphadenopathy:  Has no cervical adenopathy.  Neurological: Pt is alert and oriented to person, place, and time. Pt has normal reflexes. No cranial nerve deficit. Motor grossly intact Skin: Skin is warm and dry. No rash noted or new ulcers Psychiatric:  Has normal mood and affect. Behavior is normal.  No other new exam findings    Assessment & Plan:

## 2016-11-10 ENCOUNTER — Encounter: Payer: Self-pay | Admitting: Internal Medicine

## 2016-11-10 LAB — CBC WITH DIFFERENTIAL/PLATELET
BASOS PCT: 0.2 % (ref 0.0–3.0)
Basophils Absolute: 0 10*3/uL (ref 0.0–0.1)
EOS ABS: 0.1 10*3/uL (ref 0.0–0.7)
Eosinophils Relative: 1.1 % (ref 0.0–5.0)
HEMATOCRIT: 35 % — AB (ref 36.0–46.0)
HEMOGLOBIN: 11.9 g/dL — AB (ref 12.0–15.0)
LYMPHS PCT: 18.7 % (ref 12.0–46.0)
Lymphs Abs: 1.7 10*3/uL (ref 0.7–4.0)
MCHC: 33.9 g/dL (ref 30.0–36.0)
MCV: 91.1 fl (ref 78.0–100.0)
Monocytes Absolute: 0.6 10*3/uL (ref 0.1–1.0)
Monocytes Relative: 6.9 % (ref 3.0–12.0)
Neutro Abs: 6.7 10*3/uL (ref 1.4–7.7)
Neutrophils Relative %: 73.1 % (ref 43.0–77.0)
Platelets: 264 10*3/uL (ref 150.0–400.0)
RBC: 3.84 Mil/uL — AB (ref 3.87–5.11)
RDW: 13.2 % (ref 11.5–15.5)
WBC: 9.1 10*3/uL (ref 4.0–10.5)

## 2016-11-10 NOTE — Telephone Encounter (Signed)
Notified pt rx's has been [facility] to [facility]...Christina Waller

## 2016-11-15 NOTE — Assessment & Plan Note (Signed)

## 2016-11-15 NOTE — Assessment & Plan Note (Signed)
Most likely related to post nasal gtt, for tx as above

## 2016-11-15 NOTE — Assessment & Plan Note (Addendum)
Mild to mod, for depomedrol IM 80, predpac asd, zyrtec and nasacort asd, to f/u any worsening symptoms or concerns  In addition to the time spent performing CPE, I spent an additional 10 minutes face to face,in which greater than 50% of this time was spent in counseling and coordination of care for patient's illness as documented.

## 2016-12-17 ENCOUNTER — Ambulatory Visit: Payer: BLUE CROSS/BLUE SHIELD | Admitting: Nurse Practitioner

## 2017-01-06 ENCOUNTER — Telehealth: Payer: Self-pay | Admitting: *Deleted

## 2017-01-06 NOTE — Telephone Encounter (Signed)
Patient in 08 recall. Patient was scheduled 12/16/16. She cancelled. Please contact patient to reschedule

## 2017-01-13 NOTE — Telephone Encounter (Signed)
I have attempted to contact this patient by phone with the following results: left message to return call to [place] at [phone] on answering machine (mobile per DPR) to schedule annual exam and pap.  [phone] (Mobile) *Preferred*

## 2017-01-14 ENCOUNTER — Other Ambulatory Visit: Payer: Self-pay | Admitting: *Deleted

## 2017-01-14 MED ORDER — VALACYCLOVIR HCL 1 G PO TABS: 1000.0000 mg | ORAL_TABLET | Freq: Two times a day (BID) | ORAL | 0 refills | Status: DC

## 2017-01-14 NOTE — Telephone Encounter (Signed)
Medication refill request: valtrex  Last AEX:  11/28/15 PG Next AEX: none Last MMG (if hormonal medication request):  Refill authorized: 10/31/15 #[id number]. Today please advise.   Patient is on 08 recall and has not called back to schedule AEX.

## 2017-01-17 ENCOUNTER — Other Ambulatory Visit: Payer: Self-pay | Admitting: Nurse Practitioner

## 2017-01-17 DIAGNOSIS — Z1231 Encounter for screening mammogram for malignant neoplasm of breast: Secondary | ICD-10-CM

## 2017-01-17 DIAGNOSIS — R5381 Other malaise: Secondary | ICD-10-CM

## 2017-01-17 NOTE — Telephone Encounter (Signed)
Patient is scheduled for 01/21/17 @ 3pm with Edman Circle, FNP for annual exam and pap.

## 2017-01-18 ENCOUNTER — Other Ambulatory Visit: Payer: Self-pay | Admitting: Nurse Practitioner

## 2017-01-18 DIAGNOSIS — M8580 Other specified disorders of bone density and structure, unspecified site: Secondary | ICD-10-CM

## 2017-01-21 ENCOUNTER — Ambulatory Visit: Payer: Self-pay | Admitting: Nurse Practitioner

## 2017-01-25 ENCOUNTER — Telehealth: Payer: Self-pay | Admitting: *Deleted

## 2017-01-25 NOTE — Telephone Encounter (Signed)
Pt is clearly aware of recommendation.  Ok to remove from recall.

## 2017-01-25 NOTE — Telephone Encounter (Signed)
Patient is in 08 recall for December 2017. She has been contacted and has made two appointments, however she called back and cancelled these appointments both times. Please advise on recall status  Thanks

## 2017-01-25 NOTE — Telephone Encounter (Signed)
Patient removed from recall -eh  

## 2017-02-04 ENCOUNTER — Telehealth: Payer: Self-pay | Admitting: Internal Medicine

## 2017-02-04 NOTE — Telephone Encounter (Signed)
Pt called in to find out the status of her PA for lyrica.  She is concerned being off the meds and if she starts having effect from not taking it what should she do?

## 2017-02-07 ENCOUNTER — Other Ambulatory Visit: Payer: Self-pay | Admitting: Internal Medicine

## 2017-02-08 ENCOUNTER — Telehealth: Payer: Self-pay | Admitting: Internal Medicine

## 2017-02-08 NOTE — Telephone Encounter (Signed)
PA for Rx. Lyrica is been denied.Christina KitchenMarland KitchenPlease advice

## 2017-02-08 NOTE — Telephone Encounter (Signed)
Called pt left vm to let her know Rx lyrica had been denied and will as Dr. Jenny Reichmann for other alternative and will call pt back.[name]

## 2017-02-09 NOTE — Telephone Encounter (Signed)
Unfortunately I dont have a good alternative, ok to let pt know, we can continue the cymbalta if she is ok with this

## 2017-02-10 ENCOUNTER — Encounter: Payer: Self-pay | Admitting: Nurse Practitioner

## 2017-02-10 NOTE — Progress Notes (Signed)
Patient ID: Christina Waller, female   DOB: 10/03/1956, 61 y.o.   MRN: [id number]  60 y.o. [id number] Divorced  Caucasian Fe here for annual exam.  This past few months right hip pain.  In the past had injection of steroids.  Patient's last menstrual period was 07/13/2006 (approximate).          Sexually active: Yes.    The current method of family planning is tubal ligation.    Exercising: Yes.    Home exercise routine includes yoga and walking. Smoker:  no  Health Maintenance: Pap: 11/28/15, Negative with neg HR HPV  Colpo 02/09/13, Neg ECC - no repeat pap  01/12/13, Negative with + HR HPV, + 16/18 MMG: 03/09/13, Bi-Rads 1:  Negative, scheduled 02/11/17 Colonoscopy:  10/26/04, Normal, repeat in 8 years; flex sig 02/12/10, Colitis PCP is going to order Cologard. BMD: 02/11/17, T Score 0.5 Spine / -2.0 Right Femur Neck / -1.8 Left Femur Neck TDaP: 11/09/16 Shingles: Never Hep C: 10/2015 HIV: 08/30/08 Labs: PCP takes care of all labs - we did Vit D today.   reports that she has never smoked. She has never used smokeless tobacco. She reports that she drinks about 3.0 oz of alcohol per week . She reports that she does not use drugs.  Past Medical History:  Diagnosis Date  . Allergic rhinitis   . Anemia    NOS  . Anxiety   . Depression   . Fibromyalgia   . Headache(784.0)   . HNP (herniated nucleus pulposus), lumbar    L 2-3  . Hyperlipidemia   . Migraines   . Osteopenia   . Restless leg syndrome   . STD (sexually transmitted disease)    HSV that outbreaks mid back  . Unspecified vitamin D deficiency     Past Surgical History:  Procedure Laterality Date  . AUGMENTATION MAMMAPLASTY Bilateral 5/01   saline  . BREAST SURGERY    . COLONOSCOPY  11/05   normal recheck in 8 years  . TUBAL LIGATION     bilat    Current Outpatient Prescriptions  Medication Sig Dispense Refill  . cetirizine (ZYRTEC) 10 MG tablet Take 1 tablet (10 mg total) by mouth daily. 30 tablet 11  . clonazePAM  (KLONOPIN) 0.5 MG tablet TAKE 1 TABLET BY MOUTH EVERY MORNING AND 1 TO 2 TABLETS EVERY NIGHT AT BEDTIME AS NEEDED 60 tablet 5  . DULoxetine (CYMBALTA) 30 MG capsule TAKE ONE CAPSULE BY MOUTH TWICE DAILY 180 capsule 0  . rosuvastatin (CRESTOR) 40 MG tablet Take 1 tablet (40 mg total) by mouth daily. 90 tablet 2  . SUMAtriptan (IMITREX) 100 MG tablet TAKE 1 TABLET BY MOUTH EVERY 2 HOURS AS NEEDED FOR MIGRAINE 85 tablet 0  . traMADol (ULTRAM) 50 MG tablet TAKE 1 TABLET BY MOUTH EVERY 6 HOURS AS NEEDED FOR PAIN 60 tablet 2  . valACYclovir (VALTREX) 1000 MG tablet Take 1 tablet (1,000 mg total) by mouth 2 (two) times daily. 30 tablet 0   No current facility-administered medications for this visit.     Family History  Problem Relation Age of Onset  . Ovarian cancer Mother 83  . Osteoarthritis Mother   . Depression Mother   . Heart disease Father   . Depression Sister   . Hypertension Maternal Grandmother   . Osteoporosis Maternal Grandmother   . Ulcers Maternal Grandmother   . Heart disease Maternal Grandfather   . Colon cancer Neg Hx     ROS:  Pertinent  items are noted in HPI.  Otherwise, a comprehensive ROS was negative.  Exam:   BP 110/70 (BP Location: Right Arm, Patient Position: Sitting, Cuff Size: Normal)   Pulse 72   Ht 5' 8.5" (1.74 m)   Wt 143 lb (64.9 kg)   LMP 07/13/2006 (Approximate)   BMI 21.43 kg/m  Height: 5' 8.5" (174 cm) Ht Readings from Last 3 Encounters:  02/11/17 5' 8.5" (1.74 m)  05/26/16 5\' 9"  (1.753 m)  11/28/15 5\' 9"  (1.753 m)    General appearance: alert, cooperative and appears stated age Head: Normocephalic, without obvious abnormality, atraumatic Neck: no adenopathy, supple, symmetrical, trachea midline and thyroid normal to inspection and palpation Lungs: clear to auscultation bilaterally Breasts: normal appearance, no masses or tenderness Heart: regular rate and rhythm Abdomen: soft, non-tender; no masses,  no organomegaly Extremities:  extremities normal, atraumatic, no cyanosis or edema Skin: Skin color, texture, turgor normal. No rashes or lesions Lymph nodes: Cervical, supraclavicular, and axillary nodes normal. No abnormal inguinal nodes palpated Neurologic: Grossly normal   Pelvic: External genitalia:  no lesions              Urethra:  normal appearing urethra with no masses, tenderness or lesions              Bartholin's and Skene's: normal                 Vagina: normal appearing vagina with normal color and discharge, no lesions              Cervix: anteverted              Pap taken: Yes.   Bimanual Exam:  Uterus:  normal size, contour, position, consistency, mobility, non-tender              Adnexa: no mass, fullness, tenderness               Rectovaginal: Confirms               Anus:  normal sphincter tone, no lesions  Chaperone present: yes  A:  Well Woman with normal exam  Postmenopausal no HRT             History of normal pap with + HR HPV and Colpo biopsy done 02/09/13 that was negative but did not have a follow up pap, pap 2016 negative             History of anxiety and depression             Hypercholesteremia, osteopenia, fibromyalgia             History of HSV   P:   Reviewed health and wellness pertinent to exam  Pap smear as above  Mammogram is due 02/2018 if this one today is normal  Refill on Valtrex  Follow with Vit D and pap  Discussed at length about BMD today's visit  Counseled on breast self exam, mammography screening, adequate intake of calcium and vitamin D, diet and exercise return annually or prn  An After Visit Summary was printed and given to the patient.

## 2017-02-11 ENCOUNTER — Ambulatory Visit (INDEPENDENT_AMBULATORY_CARE_PROVIDER_SITE_OTHER): Payer: BLUE CROSS/BLUE SHIELD | Admitting: Nurse Practitioner

## 2017-02-11 ENCOUNTER — Ambulatory Visit: Payer: BLUE CROSS/BLUE SHIELD

## 2017-02-11 ENCOUNTER — Encounter: Payer: Self-pay | Admitting: Nurse Practitioner

## 2017-02-11 ENCOUNTER — Ambulatory Visit
Admission: RE | Admit: 2017-02-11 | Discharge: 2017-02-11 | Disposition: A | Discharge status: Home / Self Care | Payer: BLUE CROSS/BLUE SHIELD | Source: Ambulatory Visit | Attending: Nurse Practitioner | Admitting: Nurse Practitioner

## 2017-02-11 ENCOUNTER — Other Ambulatory Visit: Payer: Self-pay | Admitting: Nurse Practitioner

## 2017-02-11 VITALS — BP 110/70 | HR 72 | Ht 68.5 in | Wt 143.0 lb

## 2017-02-11 DIAGNOSIS — Z1231 Encounter for screening mammogram for malignant neoplasm of breast: Secondary | ICD-10-CM

## 2017-02-11 DIAGNOSIS — E559 Vitamin D deficiency, unspecified: Secondary | ICD-10-CM

## 2017-02-11 DIAGNOSIS — Z0000 Encounter for general adult medical examination without abnormal findings: Secondary | ICD-10-CM | POA: Diagnosis not present

## 2017-02-11 DIAGNOSIS — A6000 Herpesviral infection of urogenital system, unspecified: Secondary | ICD-10-CM | POA: Diagnosis not present

## 2017-02-11 DIAGNOSIS — M8588 Other specified disorders of bone density and structure, other site: Secondary | ICD-10-CM | POA: Diagnosis not present

## 2017-02-11 DIAGNOSIS — Z01411 Encounter for gynecological examination (general) (routine) with abnormal findings: Secondary | ICD-10-CM

## 2017-02-11 DIAGNOSIS — M8580 Other specified disorders of bone density and structure, unspecified site: Secondary | ICD-10-CM

## 2017-02-11 MED ORDER — VALACYCLOVIR HCL 1 G PO TABS: 1000.0000 mg | ORAL_TABLET | Freq: Two times a day (BID) | ORAL | 12 refills | Status: DC

## 2017-02-11 NOTE — Patient Instructions (Addendum)

## 2017-02-12 LAB — VITAMIN D 25 HYDROXY (VIT D DEFICIENCY, FRACTURES): VIT D 25 HYDROXY: 74 ng/mL (ref 30–100)

## 2017-02-13 NOTE — Progress Notes (Signed)
Encounter reviewed by Dr. Brook Amundson C. Silva.  

## 2017-02-14 ENCOUNTER — Telehealth: Payer: Self-pay | Admitting: *Deleted

## 2017-02-14 NOTE — Telephone Encounter (Signed)
-----   Message from [place], [place] sent at 02/14/2017  8:29 AM EST ----- Please let pt know that Vit D was 74 - she should not take OTC Vit D.

## 2017-02-14 NOTE — Telephone Encounter (Signed)
I have attempted to contact this patient by phone with the following results: left message to return call to [place] at [phone] on answering machine (mobile per DPR). No personal information given. [phone] (Mobile) *Preferred* 

## 2017-02-14 NOTE — Telephone Encounter (Signed)
Called pt and LVM about Rx Lyrica been denied and continue cymbalta

## 2017-02-16 LAB — IPS PAP TEST WITH HPV

## 2017-02-16 NOTE — Telephone Encounter (Signed)
I have attempted to contact this patient by phone with the following results: left message to return call to [place] at [phone] on answering machine (mobile per [facility]). No personal information given. [phone] (Mobile) *Preferred*

## 2017-02-18 NOTE — Telephone Encounter (Signed)
Patient returning your call.

## 2017-02-18 NOTE — Telephone Encounter (Signed)
Patient notified of results as written by provider 

## 2017-03-14 ENCOUNTER — Other Ambulatory Visit: Payer: Self-pay | Admitting: Internal Medicine

## 2017-03-15 NOTE — Telephone Encounter (Signed)
Done erx to [facility] 

## 2017-03-19 ENCOUNTER — Other Ambulatory Visit: Payer: Self-pay | Admitting: Internal Medicine

## 2017-03-22 NOTE — Telephone Encounter (Signed)
Done hardcopy to [place]  

## 2017-03-22 NOTE — Telephone Encounter (Signed)
Faxed

## 2017-03-30 ENCOUNTER — Ambulatory Visit (INDEPENDENT_AMBULATORY_CARE_PROVIDER_SITE_OTHER): Payer: BLUE CROSS/BLUE SHIELD | Admitting: Internal Medicine

## 2017-03-30 ENCOUNTER — Encounter: Payer: Self-pay | Admitting: Internal Medicine

## 2017-03-30 VITALS — BP 128/80 | HR 81 | Ht 69.0 in | Wt 155.0 lb

## 2017-03-30 DIAGNOSIS — G43809 Other migraine, not intractable, without status migrainosus: Secondary | ICD-10-CM | POA: Diagnosis not present

## 2017-03-30 DIAGNOSIS — J069 Acute upper respiratory infection, unspecified: Secondary | ICD-10-CM | POA: Insufficient documentation

## 2017-03-30 DIAGNOSIS — J309 Allergic rhinitis, unspecified: Secondary | ICD-10-CM | POA: Diagnosis not present

## 2017-03-30 DIAGNOSIS — G43909 Migraine, unspecified, not intractable, without status migrainosus: Secondary | ICD-10-CM | POA: Insufficient documentation

## 2017-03-30 MED ORDER — HYDROCODONE-HOMATROPINE 5-1.5 MG/5ML PO SYRP: 5.0000 mL | ORAL_SOLUTION | Freq: Four times a day (QID) | ORAL | 0 refills | Status: AC | PRN

## 2017-03-30 MED ORDER — AZITHROMYCIN 250 MG PO TABS: ORAL_TABLET | ORAL | 1 refills | Status: DC

## 2017-03-30 MED ORDER — PREDNISONE 10 MG PO TABS: ORAL_TABLET | ORAL | 0 refills | Status: DC

## 2017-03-30 NOTE — Patient Instructions (Signed)
Please take all new medication as prescribed - the antibiotic, cough medicine if needed, and prednisone  Please continue all other medications as before, and refills have been done if requested.  Please have the pharmacy call with any other refills you may need.  Please keep your appointments with your specialists as you may have planned

## 2017-03-30 NOTE — Assessment & Plan Note (Signed)
Mild to mod, for antibx course,  to f/u any worsening symptoms or concerns 

## 2017-03-30 NOTE — Progress Notes (Signed)
Pre visit review using our clinic review tool, if applicable. No additional management support is needed unless otherwise documented below in the visit note. 

## 2017-03-30 NOTE — Progress Notes (Signed)
Subjective:    Patient ID: Christina Waller, female    DOB: [birth year], 61 y.o.   MRN: [id number]  HPI   Here with 2-3 days acute onset fever, facial pain, pressure, headache, general weakness and malaise, and greenish d/c, with mild ST and cough, but pt denies chest pain, wheezing, increased sob or doe, orthopnea, PND, increased LE swelling, palpitations, dizziness or syncope. Does have several wks ongoing nasal allergy symptoms with clearish congestion, itch and sneezing, without fever, pain, ST, cough, swelling or wheezing. Also with incresaed freq and severity of migraine HA's, asks for predpac as seemed to help the HA's as well as the allergies previously.  Imitrex helps but only gets 9 per month.  Declines neuro eval Past Medical History:  Diagnosis Date  . Allergic rhinitis   . Anemia    NOS  . Anxiety   . Depression   . Fibromyalgia   . Headache(784.0)   . HNP (herniated nucleus pulposus), lumbar    L 2-3  . Hyperlipidemia   . Migraines   . Osteopenia   . Restless leg syndrome   . STD (sexually transmitted disease)    HSV that outbreaks mid back  . Unspecified vitamin D deficiency    Past Surgical History:  Procedure Laterality Date  . AUGMENTATION MAMMAPLASTY Bilateral 5/01   saline  . BREAST SURGERY    . COLONOSCOPY  11/05   normal recheck in 8 years  . TUBAL LIGATION     bilat    reports that she has never smoked. She has never used smokeless tobacco. She reports that she drinks about 3.0 oz of alcohol per week . She reports that she does not use drugs. family history includes Depression in her mother and sister; Heart disease in her father and maternal grandfather; Hypertension in her maternal grandmother; Osteoarthritis in her mother; Osteoporosis in her maternal grandmother; Ovarian cancer (age of onset: 36) in her mother; Ulcers in her maternal grandmother. No Known Allergies Current Outpatient Prescriptions on File Prior to Visit  Medication Sig Dispense Refill    . cetirizine (ZYRTEC) 10 MG tablet Take 1 tablet (10 mg total) by mouth daily. 30 tablet 11  . clonazePAM (KLONOPIN) 0.5 MG tablet TAKE 1 TABLET BY MOUTH EVERY MORNING AND 1 TO 2 TABLETS EVERY NIGHT AT BEDTIME AS NEEDED 60 tablet 5  . DULoxetine (CYMBALTA) 30 MG capsule TAKE ONE CAPSULE BY MOUTH TWICE DAILY 180 capsule 0  . rosuvastatin (CRESTOR) 40 MG tablet Take 1 tablet (40 mg total) by mouth daily. 90 tablet 2  . SUMAtriptan (IMITREX) 100 MG tablet TAKE 1 TABLET BY MOUTH EVERY 2 HOURS AS NEEDED FOR MIGRAINE 85 tablet 0  . traMADol (ULTRAM) 50 MG tablet TAKE 1 TABLET BY MOUTH EVERY 6 HOURS AS NEEDED 60 tablet 2  . valACYclovir (VALTREX) 1000 MG tablet Take 1 tablet (1,000 mg total) by mouth 2 (two) times daily. 30 tablet 12   No current facility-administered medications on file prior to visit.    Review of Systems  Constitutional: Negative for other unusual diaphoresis or sweats HENT: Negative for ear discharge or swelling Eyes: Negative for other worsening visual disturbances Respiratory: Negative for stridor or other swelling  Gastrointestinal: Negative for worsening distension or other blood Genitourinary: Negative for retention or other urinary change Musculoskeletal: Negative for other MSK pain or swelling Skin: Negative for color change or other new lesions Neurological: Negative for worsening tremors and other numbness  Psychiatric/Behavioral: Negative for worsening agitation or other  fatigue All other system neg per pt    Objective:   Physical Exam BP 128/80   Pulse 81   Ht 5\' 9"  (1.753 m)   Wt 155 lb (70.3 kg)   LMP 07/13/2006 (Approximate)   SpO2 99%   BMI 22.89 kg/m  .VS noted, ,mild ill Constitutional: Pt appears in NAD HENT: Head: NCAT.  Right Ear: External ear normal.  Left Ear: External ear normal.  Eyes: . Pupils are equal, round, and reactive to light. Conjunctivae and EOM are normal Bilat tm's with mild erythema.  Max sinus areas mild tender.  Pharynx  with mild erythema, no exudate Nose: without d/c or deformity Neck: Neck supple. Gross normal ROM Cardiovascular: Normal rate and regular rhythm.   Pulmonary/Chest: Effort normal and breath sounds without rales or wheezing.  Abd:  Soft, NT, ND, + BS, no organomegaly Neurological: Pt is alert. At baseline orientation, motor grossly intact, cn 2-12 intact Skin: Skin is warm. No rashes, other new lesions, no LE edema Psychiatric: Pt behavior is normal without agitation , mild nervous No other exam findings    Assessment & Plan:

## 2017-03-30 NOTE — Assessment & Plan Note (Signed)
Mild seasonal worsening, for predpac asd, restart zyrtec asd,  to f/u any worsening symptoms or concerns

## 2017-03-30 NOTE — Assessment & Plan Note (Signed)
Ok for predpac asd, I wonder about whether she has element of cluster ha

## 2017-04-08 ENCOUNTER — Other Ambulatory Visit: Payer: Self-pay | Admitting: Internal Medicine

## 2017-05-11 ENCOUNTER — Other Ambulatory Visit: Payer: Self-pay | Admitting: Internal Medicine

## 2017-05-12 NOTE — Telephone Encounter (Signed)
Faxed

## 2017-05-12 NOTE — Telephone Encounter (Signed)
Done hardcopy to [place]  

## 2017-07-05 ENCOUNTER — Telehealth: Payer: Self-pay | Admitting: Obstetrics & Gynecology

## 2017-07-05 NOTE — Telephone Encounter (Signed)
Left patient a message to call back to reschedule a future appointment that was cancelled by the provider for AEX. °

## 2017-07-25 ENCOUNTER — Other Ambulatory Visit: Payer: Self-pay | Admitting: Internal Medicine

## 2017-07-26 NOTE — Telephone Encounter (Signed)
.  Done hardcopy to Shirron  imitrex done erx

## 2017-07-26 NOTE — Telephone Encounter (Signed)
faxed

## 2017-09-15 ENCOUNTER — Other Ambulatory Visit: Payer: Self-pay | Admitting: Internal Medicine

## 2017-09-15 NOTE — Telephone Encounter (Signed)
Done erx 

## 2017-09-16 ENCOUNTER — Encounter: Payer: Self-pay | Admitting: Internal Medicine

## 2017-09-16 ENCOUNTER — Ambulatory Visit (INDEPENDENT_AMBULATORY_CARE_PROVIDER_SITE_OTHER): Payer: BLUE CROSS/BLUE SHIELD | Admitting: Internal Medicine

## 2017-09-16 ENCOUNTER — Ambulatory Visit (INDEPENDENT_AMBULATORY_CARE_PROVIDER_SITE_OTHER)
Admission: RE | Admit: 2017-09-16 | Discharge: 2017-09-16 | Disposition: A | Discharge status: Home / Self Care | Payer: BLUE CROSS/BLUE SHIELD | Source: Ambulatory Visit | Attending: Internal Medicine | Admitting: Internal Medicine

## 2017-09-16 VITALS — BP 114/78 | HR 89 | Temp 98.4°F | Ht 69.0 in | Wt 138.0 lb

## 2017-09-16 DIAGNOSIS — R05 Cough: Secondary | ICD-10-CM | POA: Diagnosis not present

## 2017-09-16 DIAGNOSIS — R053 Chronic cough: Secondary | ICD-10-CM | POA: Insufficient documentation

## 2017-09-16 DIAGNOSIS — Z23 Encounter for immunization: Secondary | ICD-10-CM

## 2017-09-16 MED ORDER — MONTELUKAST SODIUM 10 MG PO TABS: 10.0000 mg | ORAL_TABLET | Freq: Every day | ORAL | 3 refills | Status: DC

## 2017-09-16 MED ORDER — METHYLPREDNISOLONE ACETATE 80 MG/ML IJ SUSP
80.0000 mg | Freq: Once | INTRAMUSCULAR | Status: AC
  Administered 2017-09-16: 80 mg via INTRAMUSCULAR

## 2017-09-16 MED ORDER — ALBUTEROL SULFATE HFA 108 (90 BASE) MCG/ACT IN AERS: 2.0000 | INHALATION_SPRAY | Freq: Four times a day (QID) | RESPIRATORY_TRACT | 11 refills | Status: DC | PRN

## 2017-09-16 MED ORDER — PREDNISONE 10 MG PO TABS: ORAL_TABLET | ORAL | 0 refills | Status: DC

## 2017-09-16 NOTE — Patient Instructions (Signed)
You had the steroid shot today  Please take all new medication as prescribed - the prednisone, singulair, and inhaler as needed  Please continue all other medications as before, and refills have been done if requested.  Please have the pharmacy call with any other refills you may need  Please keep your appointments with your specialists as you may have planned  You will be contacted regarding the referral for: Pulmonary  Please go to the XRAY Department in the Basement (go straight as you get off the elevator) for the x-ray testing  You will be contacted by phone if any changes need to be made immediately.  Otherwise, you will receive a letter about your results with an explanation, but please check with MyChart first.  Please remember to sign up for MyChart if you have not done so, as this will be important to you in the future with finding out test results, communicating by private email, and scheduling acute appointments online when needed.

## 2017-09-16 NOTE — Progress Notes (Signed)
Subjective:    Patient ID: Christina Waller, female    DOB: [birth year], 61 y.o.   MRN: [id number]  HPI  Pt here to f/u after states coworkers fed up with her incessant non prod cough for several months, mild to mod, intermittent, not assoc with ST, fever, HA, overt reflux and Pt denies chest pain, increased sob or doe, wheezing, orthopnea, PND, increased LE swelling, palpitations, dizziness or syncope.  Pt last here April 2018 and improved for a shot time with prednisone.  Overall good compliance with treatment, and good medicine tolerability, including the zyrtec.  Prilosec OTC use discussed at last visit without significant improvement in cough.  Has known severe allergies > 20 yrs, used to be on allergy shots.   Past Medical History:  Diagnosis Date  . Allergic rhinitis   . Anemia    NOS  . Anxiety   . Depression   . Fibromyalgia   . Headache(784.0)   . HNP (herniated nucleus pulposus), lumbar    L 2-3  . Hyperlipidemia   . Migraines   . Osteopenia   . Restless leg syndrome   . STD (sexually transmitted disease)    HSV that outbreaks mid back  . Unspecified vitamin D deficiency    Past Surgical History:  Procedure Laterality Date  . AUGMENTATION MAMMAPLASTY Bilateral 5/01   saline  . BREAST SURGERY    . COLONOSCOPY  11/05   normal recheck in 8 years  . TUBAL LIGATION     bilat    reports that she has never smoked. She has never used smokeless tobacco. She reports that she drinks about 3.0 oz of alcohol per week . She reports that she does not use drugs. family history includes Depression in her mother and sister; Heart disease in her father and maternal grandfather; Hypertension in her maternal grandmother; Osteoarthritis in her mother; Osteoporosis in her maternal grandmother; Ovarian cancer (age of onset: 71) in her mother; Ulcers in her maternal grandmother. No Known Allergies Current Outpatient Prescriptions on File Prior to Visit  Medication Sig Dispense Refill  .  cetirizine (ZYRTEC) 10 MG tablet Take 1 tablet (10 mg total) by mouth daily. 30 tablet 11  . clonazePAM (KLONOPIN) 0.5 MG tablet TAKE 1 TABLET BY MOUTH EVERY MORNING AND TAKE 1-2 TABLETS BY MOUTH EVERY NIGHT AT BEDTIME AS NEEDED 60 tablet 5  . DULoxetine (CYMBALTA) 30 MG capsule TAKE ONE CAPSULE BY MOUTH TWICE A DAY 180 capsule 1  . rosuvastatin (CRESTOR) 40 MG tablet TAKE 1 TABLET(40 MG) BY MOUTH DAILY 90 tablet 1  . SUMAtriptan (IMITREX) 100 MG tablet TAKE 1 TABLET BY MOUTH AS NEEDED FOR MIGRAINE MAY REPEAT AFTER 2 HOURS IF NEEDED 4 tablet 5  . traMADol (ULTRAM) 50 MG tablet TAKE 1 TABLET BY MOUTH EVERY 6 HOURS AS NEEDED FOR PAIN 60 tablet 2  . valACYclovir (VALTREX) 1000 MG tablet Take 1 tablet (1,000 mg total) by mouth 2 (two) times daily. 30 tablet 12   No current facility-administered medications on file prior to visit.     Review of Systems  All other system neg per pt    Objective:   Physical Exam BP 114/78   Pulse 89   Temp 98.4 F (36.9 C) (Oral)   Ht 5\' 9"  (1.753 m)   Wt 138 lb (62.6 kg)   LMP 07/13/2006 (Approximate)   SpO2 100%   BMI 20.38 kg/m  VS noted,  Constitutional: Pt appears in NAD HENT: Head: NCAT.  Right  Ear: External ear normal.  Left Ear: External ear normal.  Eyes: . Pupils are equal, round, and reactive to light. Conjunctivae and EOM are normal Nose: without d/c or deformity Neck: Neck supple. Gross normal ROM Bilat tm's with mild erythema.  Max sinus areas non  tender.  Pharynx with mild erythema, no exudate Cardiovascular: Normal rate and regular rhythm.   Pulmonary/Chest: Effort normal and breath sounds without rales or wheezing.  Abd:  Soft, NT, ND, + BS, no organomegaly Neurological: Pt is alert. At baseline orientation, motor grossly intact Skin: Skin is warm. No rashes, other new lesions, no LE edema Psychiatric: Pt behavior is normal without agitation  No other exam findings    Assessment & Plan:

## 2017-09-17 ENCOUNTER — Encounter: Payer: Self-pay | Admitting: Internal Medicine

## 2017-09-17 NOTE — Assessment & Plan Note (Addendum)
?   Cough variant asthma, for depomedrol 80 IM, predpac asd, singulair, albuterol HFA prn, CXR and pulm referral

## 2017-10-03 ENCOUNTER — Other Ambulatory Visit: Payer: Self-pay | Admitting: Obstetrics and Gynecology

## 2017-10-03 ENCOUNTER — Encounter: Payer: Self-pay | Admitting: Obstetrics and Gynecology

## 2017-10-03 ENCOUNTER — Ambulatory Visit (INDEPENDENT_AMBULATORY_CARE_PROVIDER_SITE_OTHER): Payer: BLUE CROSS/BLUE SHIELD | Admitting: Obstetrics and Gynecology

## 2017-10-03 ENCOUNTER — Telehealth: Payer: Self-pay | Admitting: Obstetrics & Gynecology

## 2017-10-03 VITALS — BP 118/62 | HR 80 | Resp 16 | Wt 142.0 lb

## 2017-10-03 DIAGNOSIS — N644 Mastodynia: Secondary | ICD-10-CM | POA: Diagnosis not present

## 2017-10-03 NOTE — Telephone Encounter (Signed)
Returned call to patient. Patient states that she has breast implants that are ~62 years old and she occasionally has sharp, shooting pains in her breast, but that the pain has become more and more. Patient states the pain is now there when she wakes up in the mornings and it is a spasm-like pain. Last MMG was 02/14/17. RN advised would need to be seen in office for further evaluation. Patient asking to schedule this afternoon or Friday due to work schedule. RN advised Dr. Sabra Heck is out of the office this afternoon, but can schedule with another provider. Patient agreeable. Per Gay Filler, patient scheduled with Dr. Talbert Nan for 10/03/17 at 1445. Patient aware she will be worked in to schedule.   Routing to provider for final review. Patient agreeable to disposition. Will close encounter.    CC Dr. Sabra Heck

## 2017-10-03 NOTE — Progress Notes (Signed)
Patient scheduled while in office for diagnostic MMG left breast with implant, [place] if needed. Scheduled for 10/07/17 arriving at 1:30pm for 1:50pm appointment. Patient verbalizes understanding and is agreeable.

## 2017-10-03 NOTE — Progress Notes (Signed)
GYNECOLOGY  VISIT   HPI: 61 y.o.   Divorced  Caucasian  female   [phone] with Patient's last menstrual period was 07/13/2006 (approximate).   here for left breast pain, per patient on the lower part of breast. Per patient was sharp, shooting pain, now turned into sorenss. She reports a several month h/o intermittent sharp pains in her lateral left breast. Worse in the last 2 weeks, now more sore in the lower and outer breast. Slight dull ache is constant for the last week.  No right breast pain. She drinks one cup of coffee a day.   Last mammogram was in 3/18, normal.   GYNECOLOGIC HISTORY: Patient's last menstrual period was 07/13/2006 (approximate). Contraception:Postmenopausal Menopausal hormone therapy: none        OB History    Gravida Para Term Preterm AB Living   3 3 3  0 0 3   SAB TAB Ectopic Multiple Live Births   0 0 0 0 3         Patient Active Problem List   Diagnosis Date Noted  . Chronic cough 09/16/2017  . Acute upper respiratory infection 03/30/2017  . Migraine 03/30/2017  . Cough 11/09/2016  . Bunion, left foot 10/23/2015  . Skin lesion 10/23/2015  . Lower back pain 09/15/2014  . Right hip pain 09/14/2013  . Encounter for well adult exam with abnormal findings 06/29/2011  . HEMORRHOIDS-EXTERNAL 01/09/2010  . [place] NONINFECTIOUS GASTROENTERITIS&COLITIS 12/02/2009  . OSTEOPENIA 02/14/2009  . FATIGUE 08/16/2008  . DYSPNEA 08/16/2008  . CARPAL TUNNEL SYNDROME, LEFT 05/21/2008  . HYPERLIPIDEMIA 04/12/2008  . ANEMIA-NOS 04/12/2008  . Allergic rhinitis 04/12/2008  . Anxiety state 10/31/2007  . DEPRESSION 10/31/2007  . FIBROMYALGIA 10/31/2007    Past Medical History:  Diagnosis Date  . Allergic rhinitis   . Anemia    NOS  . Anxiety   . Depression   . Fibromyalgia   . Headache(784.0)   . HNP (herniated nucleus pulposus), lumbar    L 2-3  . Hyperlipidemia   . Migraines   . Osteopenia   . Restless leg syndrome   . STD (sexually transmitted  disease)    HSV that outbreaks mid back  . Unspecified vitamin D deficiency     Past Surgical History:  Procedure Laterality Date  . AUGMENTATION MAMMAPLASTY Bilateral 5/01   saline  . BREAST SURGERY    . COLONOSCOPY  11/05   normal recheck in 8 years  . TUBAL LIGATION     bilat    Current Outpatient Prescriptions  Medication Sig Dispense Refill  . albuterol (PROVENTIL HFA;VENTOLIN HFA) 108 (90 Base) MCG/ACT inhaler Inhale 2 puffs into the lungs every 6 (six) hours as needed for wheezing or shortness of breath. 1 Inhaler 11  . cetirizine (ZYRTEC) 10 MG tablet Take 1 tablet (10 mg total) by mouth daily. 30 tablet 11  . clonazePAM (KLONOPIN) 0.5 MG tablet TAKE 1 TABLET BY MOUTH EVERY MORNING AND TAKE 1-2 TABLETS BY MOUTH EVERY NIGHT AT BEDTIME AS NEEDED 60 tablet 5  . DULoxetine (CYMBALTA) 30 MG capsule TAKE ONE CAPSULE BY MOUTH TWICE A DAY 180 capsule 1  . montelukast (SINGULAIR) 10 MG tablet Take 1 tablet (10 mg total) by mouth at bedtime. 90 tablet 3  . rosuvastatin (CRESTOR) 40 MG tablet TAKE 1 TABLET(40 MG) BY MOUTH DAILY 90 tablet 1  . SUMAtriptan (IMITREX) 100 MG tablet TAKE 1 TABLET BY MOUTH AS NEEDED FOR MIGRAINE MAY REPEAT AFTER 2 HOURS IF NEEDED 4 tablet 5  .  traMADol (ULTRAM) 50 MG tablet TAKE 1 TABLET BY MOUTH EVERY 6 HOURS AS NEEDED FOR PAIN 60 tablet 2  . valACYclovir (VALTREX) 1000 MG tablet Take 1 tablet (1,000 mg total) by mouth 2 (two) times daily. 30 tablet 12   No current facility-administered medications for this visit.      ALLERGIES: Patient has no known allergies.  Family History  Problem Relation Age of Onset  . Ovarian cancer Mother 78  . Osteoarthritis Mother   . Depression Mother   . Heart disease Father   . Depression Sister   . Hypertension Maternal Grandmother   . Osteoporosis Maternal Grandmother   . Ulcers Maternal Grandmother   . Heart disease Maternal Grandfather   . Colon cancer Neg Hx     Social History   Social History  . Marital  status: Divorced    Spouse name: N/A  . Number of children: 3  . Years of education: N/A   Occupational History  . 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 Smoking status: Never Smoker  . Smokeless tobacco: Never Used  . Alcohol use 3.0 oz/week    5 Standard drinks or equivalent per week  . Drug use: No  . Sexual activity: Yes    Partners: Male    Birth control/ protection: Surgical     Comment: BTL   Other Topics Concern  . Not on file   Social History Narrative   Daily caffeine use 1 per day    Review of Systems  Constitutional:       Breast pain  HENT: Negative.   Eyes: Negative.   Respiratory: Negative.   Cardiovascular: Negative.   Gastrointestinal: Negative.   Genitourinary: Negative.   Musculoskeletal: Positive for joint pain and myalgias.  Skin: Negative.   Neurological: Negative.   Endo/Heme/Allergies: Negative.   Psychiatric/Behavioral: Negative.     PHYSICAL EXAMINATION:    BP 118/62 (BP Location: Right Arm, Patient Position: Sitting, Cuff Size: Normal)   Pulse 80   Resp 16   Wt 142 lb (64.4 kg)   LMP 07/13/2006 (Approximate)   BMI 20.97 kg/m     General appearance: alert, cooperative and appears stated age Neck: no adenopathy, supple, symmetrical, trachea midline and thyroid normal to inspection and palpation Breasts: normal appearance, no masses, tender lateral with palpation of the left breast. Bilateral implants, not firm.   ASSESSMENT Mastalgia, left breast    PLAN Diagnostic imaging left breast  Recommended she cut back on caffeine F/U as needed   An After Visit Summary was printed and given to the patient.

## 2017-10-03 NOTE — Telephone Encounter (Signed)
Patient calling with left breast pain.

## 2017-10-07 ENCOUNTER — Ambulatory Visit
Admission: RE | Admit: 2017-10-07 | Discharge: 2017-10-07 | Disposition: A | Discharge status: Home / Self Care | Payer: BLUE CROSS/BLUE SHIELD | Source: Ambulatory Visit | Attending: Obstetrics and Gynecology | Admitting: Obstetrics and Gynecology

## 2017-10-07 DIAGNOSIS — N644 Mastodynia: Secondary | ICD-10-CM

## 2017-10-09 ENCOUNTER — Other Ambulatory Visit: Payer: Self-pay | Admitting: Internal Medicine

## 2017-10-11 ENCOUNTER — Other Ambulatory Visit: Payer: Self-pay | Admitting: Internal Medicine

## 2017-10-11 NOTE — Telephone Encounter (Signed)
Routing to dr john, please advise, thanks 

## 2017-10-13 ENCOUNTER — Telehealth: Payer: Self-pay

## 2017-10-13 NOTE — Telephone Encounter (Signed)
-----   Message from Salvadore Dom, MD sent at 10/10/2017 10:04 AM EDT ----- Take out of hold. As we discussed, caffeine can worsen breast pain. She can try over the counter evening primrose oil and vit E. She should call if her pain worsens, or with any other concerns.

## 2017-10-13 NOTE — Telephone Encounter (Signed)
Patient removed from hold. Left detailed message at number provided [phone], okay per ROI. Advised of message as seen below from Melbeta. Encounter closed.

## 2017-10-18 ENCOUNTER — Telehealth: Payer: Self-pay | Admitting: Internal Medicine

## 2017-10-18 MED ORDER — SUMATRIPTAN SUCCINATE 100 MG PO TABS: ORAL_TABLET | ORAL | 11 refills | Status: DC

## 2017-10-18 NOTE — Telephone Encounter (Signed)
Pt called asking why her SUMAtriptan (IMITREX) 100 MG tablet was reduced to 4 pills per refill  Please advisse and call back in regard

## 2017-10-18 NOTE — Telephone Encounter (Signed)
Patient notified.,

## 2017-10-18 NOTE — Telephone Encounter (Signed)
rx done erx 

## 2017-10-25 ENCOUNTER — Encounter: Payer: Self-pay | Admitting: Internal Medicine

## 2017-11-11 ENCOUNTER — Encounter: Payer: Self-pay | Admitting: Internal Medicine

## 2017-11-11 ENCOUNTER — Telehealth: Payer: Self-pay

## 2017-11-11 ENCOUNTER — Ambulatory Visit (INDEPENDENT_AMBULATORY_CARE_PROVIDER_SITE_OTHER): Payer: BLUE CROSS/BLUE SHIELD | Admitting: Internal Medicine

## 2017-11-11 VITALS — BP 120/60 | HR 92 | Temp 98.3°F | Ht 69.0 in | Wt 144.4 lb

## 2017-11-11 DIAGNOSIS — Z0000 Encounter for general adult medical examination without abnormal findings: Secondary | ICD-10-CM

## 2017-11-11 NOTE — Progress Notes (Signed)
Subjective:    Patient ID: Christina Waller, female    DOB: Jul 27, 1956, 61 y.o.   MRN: [id number]  HPI  Here for wellness and f/u;  Overall doing ok;  Pt denies Chest pain, worsening SOB, DOE, wheezing, orthopnea, PND, worsening LE edema, palpitations, dizziness or syncope.  Pt denies neurological change such as new headache, facial or extremity weakness.  Pt denies polydipsia, polyuria, or low sugar symptoms. Pt states overall good compliance with treatment and medications, good tolerability, and has been trying to follow appropriate diet.  Pt denies worsening depressive symptoms, suicidal ideation or panic. No fever, night sweats, wt loss, loss of appetite, or other constitutional symptoms.  Pt states good ability with ADL's, has low fall risk, home safety reviewed and adequate, no other significant changes in hearing or vision, and only occasionally active with exercise. Taking crestor regularly now, after last yr when did not take daily as she tried to work on diet as well.  No new complaints or interval hx except has several wks ongoing nasal allergy symptoms with clearish congestion, itch and sneezing, and chronic cough without fever, pain, ST, swelling or wheezing. Past Medical History:  Diagnosis Date  . Allergic rhinitis   . Anemia    NOS  . Anxiety   . Depression   . Fibromyalgia   . Headache(784.0)   . HNP (herniated nucleus pulposus), lumbar    L 2-3  . Hyperlipidemia   . Migraines   . Osteopenia   . Restless leg syndrome   . STD (sexually transmitted disease)    HSV that outbreaks mid back  . Unspecified vitamin D deficiency    Past Surgical History:  Procedure Laterality Date  . AUGMENTATION MAMMAPLASTY Bilateral 5/01   saline  . BREAST SURGERY    . COLONOSCOPY  11/05   normal recheck in 8 years  . TUBAL LIGATION     bilat    reports that  has never smoked. she has never used smokeless tobacco. She reports that she drinks about 3.0 oz of alcohol per week. She reports  that she does not use drugs. family history includes Depression in her mother and sister; Heart disease in her father and maternal grandfather; Hypertension in her maternal grandmother; Osteoarthritis in her mother; Osteoporosis in her maternal grandmother; Ovarian cancer (age of onset: 83) in her mother; Ulcers in her maternal grandmother. No Known Allergies Current Outpatient Medications on File Prior to Visit  Medication Sig Dispense Refill  . clonazePAM (KLONOPIN) 0.5 MG tablet TAKE 1 TABLET BY MOUTH EVERY MORNING AND TAKE 1-2 TABLETS BY MOUTH EVERY NIGHT AT BEDTIME AS NEEDED 60 tablet 5  . DULoxetine (CYMBALTA) 30 MG capsule TAKE ONE CAPSULE BY MOUTH TWICE A DAY 180 capsule 1  . montelukast (SINGULAIR) 10 MG tablet Take 1 tablet (10 mg total) by mouth at bedtime. 90 tablet 3  . rosuvastatin (CRESTOR) 40 MG tablet TAKE 1 TABLET BY MOUTH EVERY DAY 90 tablet 0  . SUMAtriptan (IMITREX) 100 MG tablet TAKE 1 TABLET BY MOUTH AS NEEDED FOR MIGRAINE MAY REPEAT AFTER 2 HOURS IF NEEDED 12 tablet 11  . traMADol (ULTRAM) 50 MG tablet TAKE 1 TABLET BY MOUTH EVERY 6 HOURS AS NEEDED FOR PAIN 60 tablet 2  . valACYclovir (VALTREX) 1000 MG tablet Take 1 tablet (1,000 mg total) by mouth 2 (two) times daily. 30 tablet 12  . cetirizine (ZYRTEC) 10 MG tablet Take 1 tablet (10 mg total) by mouth daily. 30 tablet 11  No current facility-administered medications on file prior to visit.    Review of Systems Constitutional: Negative for other unusual diaphoresis, sweats, appetite or weight changes HENT: Negative for other worsening hearing loss, ear pain, facial swelling, mouth sores or neck stiffness.   Eyes: Negative for other worsening pain, redness or other visual disturbance.  Respiratory: Negative for other stridor or swelling Cardiovascular: Negative for other palpitations or other chest pain  Gastrointestinal: Negative for worsening diarrhea or loose stools, blood in stool, distention or other  pain Genitourinary: Negative for hematuria, flank pain or other change in urine volume.  Musculoskeletal: Negative for myalgias or other joint swelling.  Skin: Negative for other color change, or other wound or worsening drainage.  Neurological: Negative for other syncope or numbness. Hematological: Negative for other adenopathy or swelling Psychiatric/Behavioral: Negative for hallucinations, other worsening agitation, SI, self-injury, or new decreased concentration All other system neg per pt    Objective:   Physical Exam BP 120/60 (BP Location: Left Arm, Patient Position: Sitting, Cuff Size: Normal)   Pulse 92   Temp 98.3 F (36.8 C) (Oral)   Ht 5\' 9"  (1.753 m)   Wt 144 lb 6.4 oz (65.5 kg)   LMP 07/13/2006 (Approximate)   SpO2 96%   BMI 21.32 kg/m  VS noted,  Constitutional: Pt is oriented to person, place, and time. Appears well-developed and well-nourished, in no significant distress and comfortable Head: Normocephalic and atraumatic  Eyes: Conjunctivae and EOM are normal. Pupils are equal, round, and reactive to light Right Ear: External ear normal without discharge Left Ear: External ear normal without discharge Nose: Nose without discharge or deformity Mouth/Throat: Oropharynx is without other ulcerations and moist  Neck: Normal range of motion. Neck supple. No JVD present. No tracheal deviation present or significant neck LA or mass Cardiovascular: Normal rate, regular rhythm, normal heart sounds and intact distal pulses.   Pulmonary/Chest: WOB normal and breath sounds without rales or wheezing  Abdominal: Soft. Bowel sounds are normal. NT. No HSM  Musculoskeletal: Normal range of motion. Exhibits no edema Lymphadenopathy: Has no other cervical adenopathy.  Neurological: Pt is alert and oriented to person, place, and time. Pt has normal reflexes. No cranial nerve deficit. Motor grossly intact, Gait intact Skin: Skin is warm and dry. No rash noted or new  ulcerations Psychiatric:  Has normal mood and affect. Behavior is normal without agitation No other exam findings     Assessment & Plan:

## 2017-11-11 NOTE — Patient Instructions (Addendum)
The nurse today will sign you up for the Cologuard  Please continue all other medications as before, and refills have been done if requested.  Please have the pharmacy call with any other refills you may need.  Please continue your efforts at being more active, low cholesterol diet, and weight control.  You are otherwise up to date with prevention measures today.  Please keep your appointments with your specialists as you may have planned  Please go to the LAB in the Basement (turn left off the elevator) for the tests to be done today  You will be contacted by phone if any changes need to be made immediately.  Otherwise, you will receive a letter about your results with an explanation, but please check with MyChart first.  Please remember to sign up for MyChart if you have not done so, as this will be important to you in the future with finding out test results, communicating by private email, and scheduling acute appointments online when needed.  Please return in 1 year for your yearly visit, or sooner if needed, with Lab testing done 3-5 days before

## 2017-11-11 NOTE — Telephone Encounter (Signed)
Order [id number]

## 2017-11-12 NOTE — Assessment & Plan Note (Signed)

## 2017-11-15 ENCOUNTER — Other Ambulatory Visit: Payer: Self-pay | Admitting: Internal Medicine

## 2017-11-16 NOTE — Telephone Encounter (Signed)
Tramadol not filled in 2 months so will fill for #30 only. Clonazepam refilled. It is unclear why she did not get refilled at her recent visit with Dr. Jenny Reichmann. No documentation in the chart regarding these medication indications.

## 2017-11-29 ENCOUNTER — Telehealth: Payer: Self-pay

## 2017-11-29 NOTE — Telephone Encounter (Signed)
Spoke with Christina Waller from [place] who states that the pharmacy made an error with filling the patient's rx for Valacyclovir 1000 mg tablets that was written in March 2018 by Kem [place], FNP. States this was written to take 1 tablet BID. Pharmacy filled rx to take 1 tablet once daily. States this has been corrected, but wanted to notify the office.  Routing to [employer] CNM for review before closing.

## 2017-12-09 NOTE — Telephone Encounter (Signed)
Christina Waller CNM okay to close?

## 2017-12-11 ENCOUNTER — Other Ambulatory Visit: Payer: Self-pay | Admitting: Internal Medicine

## 2017-12-12 NOTE — Telephone Encounter (Signed)
Done erx 

## 2017-12-20 ENCOUNTER — Other Ambulatory Visit: Payer: Self-pay | Admitting: Internal Medicine

## 2017-12-21 NOTE — Telephone Encounter (Signed)
Done erx 

## 2017-12-27 NOTE — Telephone Encounter (Signed)
Ok to close

## 2018-01-18 ENCOUNTER — Other Ambulatory Visit: Payer: Self-pay | Admitting: Internal Medicine

## 2018-02-10 ENCOUNTER — Telehealth: Payer: Self-pay | Admitting: Internal Medicine

## 2018-02-10 NOTE — Telephone Encounter (Signed)
Copied from [place] [id number]. Topic: Quick Communication - Rx Refill/Question >> Feb 10, 2018 12:22 PM Wynetta Emery, [place] C wrote: Medication: Tramadol -- pt says that she was advised that medication needs a PA. Pt called in because she said that she was made aware of this months ago and wanted to check to see if it had been completed. Pt says that she need a refill on medication. Please advise.    Has the patient contacted their pharmacy? Yes    (Agent: If no, request that the patient contact the pharmacy for the refill.)   Preferred Pharmacy (with phone number or street name): [id number] [place], [place]: Please be advised that RX refills may take up to 3 business days. We ask that you follow-up with your pharmacy.

## 2018-02-10 NOTE — Telephone Encounter (Signed)
Spoke with pt. She stated that she is using new insurance starting this month and will talk with the pharmacy to see if a PA is needed or if the med can be refilled. I told her I would look out for a PA request if one is needed.

## 2018-02-17 ENCOUNTER — Ambulatory Visit: Payer: BLUE CROSS/BLUE SHIELD | Admitting: Certified Nurse Midwife

## 2018-02-17 ENCOUNTER — Ambulatory Visit: Payer: BLUE CROSS/BLUE SHIELD | Admitting: Nurse Practitioner

## 2018-03-06 ENCOUNTER — Ambulatory Visit: Payer: Self-pay | Admitting: Family

## 2018-03-06 ENCOUNTER — Encounter: Payer: Self-pay | Admitting: Family

## 2018-03-06 VITALS — BP 118/80 | HR 91 | Temp 98.8°F | Ht 69.0 in | Wt 145.0 lb

## 2018-03-06 DIAGNOSIS — J029 Acute pharyngitis, unspecified: Secondary | ICD-10-CM | POA: Diagnosis not present

## 2018-03-06 LAB — POCT RAPID STREP A (OFFICE): Rapid Strep A Screen: POSITIVE — AB

## 2018-03-06 MED ORDER — AMOXICILLIN 875 MG PO TABS: 875.0000 mg | ORAL_TABLET | Freq: Two times a day (BID) | ORAL | 0 refills | Status: DC

## 2018-03-06 NOTE — Addendum Note (Signed)
Addended by: Marcina Millard on: 03/06/2018 03:49 PM   Modules accepted: Orders

## 2018-03-06 NOTE — Progress Notes (Signed)
Christina Waller is a 62 y.o. female with the following history as recorded in EpicCare:  Patient Active Problem List   Diagnosis Date Noted  . Chronic cough 09/16/2017  . Acute upper respiratory infection 03/30/2017  . Migraine 03/30/2017  . Cough 11/09/2016  . Bunion, left foot 10/23/2015  . Skin lesion 10/23/2015  . Lower back pain 09/15/2014  . Right hip pain 09/14/2013  . Preventative health care 06/29/2011  . HEMORRHOIDS-EXTERNAL 01/09/2010  . [place] NONINFECTIOUS GASTROENTERITIS&COLITIS 12/02/2009  . OSTEOPENIA 02/14/2009  . FATIGUE 08/16/2008  . DYSPNEA 08/16/2008  . CARPAL TUNNEL SYNDROME, LEFT 05/21/2008  . HYPERLIPIDEMIA 04/12/2008  . ANEMIA-NOS 04/12/2008  . Allergic rhinitis 04/12/2008  . Anxiety state 10/31/2007  . DEPRESSION 10/31/2007  . FIBROMYALGIA 10/31/2007    Current Outpatient Medications  Medication Sig Dispense Refill  . cetirizine (ZYRTEC) 10 MG tablet TAKE 1 TABLET BY MOUTH EVERY DAY 30 tablet 2  . clonazePAM (KLONOPIN) 0.5 MG tablet TAKE 1 TABLET BY MOUTH EVERY MORNING TAKE 1 TO 2 TABLETS BY MOUTH AT BEDTIME AS NEEDED 60 tablet 2  . DULoxetine (CYMBALTA) 30 MG capsule TAKE ONE CAPSULE BY MOUTH TWICE A DAY 180 capsule 1  . montelukast (SINGULAIR) 10 MG tablet Take 1 tablet (10 mg total) by mouth at bedtime. 90 tablet 3  . rosuvastatin (CRESTOR) 40 MG tablet TAKE 1 TABLET BY MOUTH EVERY DAY 90 tablet 0  . SUMAtriptan (IMITREX) 100 MG tablet TAKE 1 TABLET BY MOUTH AS NEEDED FOR MIGRAINE MAY REPEAT AFTER 2 HOURS IF NEEDED 12 tablet 11  . traMADol (ULTRAM) 50 MG tablet TAKE 1 TABLET (50 MG TOTAL) BY MOUTH DAILY AS NEEDED. FOR PAIN 30 tablet 2  . valACYclovir (VALTREX) 1000 MG tablet Take 1 tablet (1,000 mg total) by mouth 2 (two) times daily. 30 tablet 12  . amoxicillin (AMOXIL) 875 MG tablet Take 1 tablet (875 mg total) by mouth 2 (two) times daily. 20 tablet 0   No current facility-administered medications for this visit.     Allergies: Patient has  no known allergies.  Past Medical History:  Diagnosis Date  . Allergic rhinitis   . Anemia    NOS  . Anxiety   . Depression   . Fibromyalgia   . Headache(784.0)   . HNP (herniated nucleus pulposus), lumbar    L 2-3  . Hyperlipidemia   . Migraines   . Osteopenia   . Restless leg syndrome   . STD (sexually transmitted disease)    HSV that outbreaks mid back  . Unspecified vitamin D deficiency     Past Surgical History:  Procedure Laterality Date  . AUGMENTATION MAMMAPLASTY Bilateral 5/01   saline  . BREAST SURGERY    . COLONOSCOPY  11/05   normal recheck in 8 years  . TUBAL LIGATION     bilat    Family History  Problem Relation Age of Onset  . Ovarian cancer Mother 32  . Osteoarthritis Mother   . Depression Mother   . Heart disease Father   . Depression Sister   . Hypertension Maternal Grandmother   . Osteoporosis Maternal Grandmother   . Ulcers Maternal Grandmother   . Heart disease Maternal Grandfather   . Colon cancer Neg Hx     Social History   Tobacco Use  . Smoking status: Never Smoker  . Smokeless tobacco: Never Used  Substance Use Topics  . Alcohol use: Yes    Alcohol/week: 3.0 oz    Types: 5 Standard drinks or  equivalent per week    Subjective:  Sore throat x 3 days; + low grade fever; + headache; no cough/ congestion; works with the public- dental hygenist; + painful to swallow;   Objective:  Vitals:   03/06/18 1334  BP: 118/80  Pulse: 91  Temp: 98.8 F (37.1 C)  TempSrc: Oral  SpO2: 99%  Weight: 145 lb 0.6 oz (65.8 kg)  Height: 5\' 9"  (1.753 m)    General: Well developed, well nourished, in no acute distress  Skin : Warm and dry.  Head: Normocephalic and atraumatic  Eyes: Sclera and conjunctiva clear; pupils round and reactive to light; extraocular movements intact  Ears: External normal; canals clear; tympanic membranes normal  Oropharynx: Pink, supple. No suspicious lesions  Neck: Supple without thyromegaly, adenopathy  Lungs:  Respirations unlabored; clear to auscultation bilaterally without wheeze, rales, rhonchi  CVS exam: normal rate and regular rhythm.  Neurologic: Alert and oriented; speech intact; face symmetrical; moves all extremities well; CNII-XII intact without focal deficit   Assessment:  1. Sore throat     Plan:  Rapid strep is positive; Rx for Amoxicillin 875 mg bid x 10 days; encouraged to change toothbrush as instructed; increase fluids, rest and follow up worse, no better.   No follow-ups on file.  No orders of the defined types were placed in this encounter.   Requested Prescriptions   Signed Prescriptions Disp Refills  . amoxicillin (AMOXIL) 875 MG tablet 20 tablet 0    Sig: Take 1 tablet (875 mg total) by mouth 2 (two) times daily.

## 2018-03-09 ENCOUNTER — Other Ambulatory Visit: Payer: Self-pay | Admitting: Internal Medicine

## 2018-03-18 ENCOUNTER — Other Ambulatory Visit: Payer: Self-pay | Admitting: Internal Medicine

## 2018-03-22 NOTE — Telephone Encounter (Signed)
01/10/2018: Cancelled - Suspended for Inactivity

## 2018-04-03 ENCOUNTER — Telehealth: Payer: Self-pay | Admitting: Internal Medicine

## 2018-04-03 NOTE — Telephone Encounter (Signed)
I am not sure what to say, as they dont make a 75 mg pill.  Please let me know if I be of other assistance

## 2018-04-03 NOTE — Telephone Encounter (Signed)
Copied from [place] [id number]. Topic: Quick Communication - See Telephone Encounter >> Apr 03, 2018  2:12 PM Arletha Grippe wrote: CRM for notification. See Telephone encounter for: 04/03/18. Pt calling about her DULoxetine (CYMBALTA) 30 MG capsule. Insurance will only cover 1 per day.  Pt is asking if you can send in rx for 75 mg, and she can take 1 of those per day. [facility] [place]  Cb [phone]

## 2018-04-04 NOTE — Telephone Encounter (Signed)
Called pt, LVM with details below  

## 2018-04-05 MED ORDER — DULOXETINE HCL 60 MG PO CPEP: 60.0000 mg | ORAL_CAPSULE | Freq: Every day | ORAL | 3 refills | Status: DC

## 2018-04-05 NOTE — Telephone Encounter (Signed)
Pt states they do make a 60mg  tabet and she would like to know if this can be prescribred for her or if we can do a PA for the CYMBALTA 30mg  BID since her insurance company only wants to cover 1 pill per day.  Please advise

## 2018-04-05 NOTE — Telephone Encounter (Signed)
Ok this is done 

## 2018-04-05 NOTE — Addendum Note (Signed)
Addended by: Biagio Borg on: 04/05/2018 06:27 PM   Modules accepted: Orders

## 2018-04-13 ENCOUNTER — Other Ambulatory Visit: Payer: Self-pay

## 2018-04-13 MED ORDER — VALACYCLOVIR HCL 1 G PO TABS: 1000.0000 mg | ORAL_TABLET | Freq: Two times a day (BID) | ORAL | 2 refills | Status: DC

## 2018-04-13 NOTE — Telephone Encounter (Signed)
[facility] [facility].  Medication refill request: Valtrex1gm #30 Last AEX:  02-11-17 Next AEX: 04-28-18 Last MMG (if hormonal medication request): See Epic Refill authorized: Please advise

## 2018-04-16 ENCOUNTER — Other Ambulatory Visit: Payer: Self-pay | Admitting: Internal Medicine

## 2018-04-20 ENCOUNTER — Telehealth: Payer: Self-pay | Admitting: Certified Nurse Midwife

## 2018-04-20 NOTE — Telephone Encounter (Signed)
Left message on voicemail to call and reschedule cancelled appointment. °

## 2018-04-21 ENCOUNTER — Other Ambulatory Visit (INDEPENDENT_AMBULATORY_CARE_PROVIDER_SITE_OTHER): Payer: No Typology Code available for payment source

## 2018-04-21 ENCOUNTER — Encounter: Payer: Self-pay | Admitting: Internal Medicine

## 2018-04-21 ENCOUNTER — Ambulatory Visit: Payer: No Typology Code available for payment source | Admitting: Internal Medicine

## 2018-04-21 ENCOUNTER — Ambulatory Visit: Payer: Self-pay | Admitting: *Deleted

## 2018-04-21 ENCOUNTER — Ambulatory Visit (INDEPENDENT_AMBULATORY_CARE_PROVIDER_SITE_OTHER)
Admission: RE | Admit: 2018-04-21 | Discharge: 2018-04-21 | Disposition: A | Discharge status: Home / Self Care | Payer: No Typology Code available for payment source | Source: Ambulatory Visit | Attending: Internal Medicine | Admitting: Internal Medicine

## 2018-04-21 VITALS — BP 110/62 | HR 94 | Temp 98.4°F | Ht 69.0 in | Wt 142.5 lb

## 2018-04-21 DIAGNOSIS — R042 Hemoptysis: Secondary | ICD-10-CM

## 2018-04-21 DIAGNOSIS — R05 Cough: Secondary | ICD-10-CM

## 2018-04-21 DIAGNOSIS — F411 Generalized anxiety disorder: Secondary | ICD-10-CM | POA: Diagnosis not present

## 2018-04-21 DIAGNOSIS — R053 Chronic cough: Secondary | ICD-10-CM

## 2018-04-21 LAB — CBC WITH DIFFERENTIAL/PLATELET
BASOS ABS: 0.1 10*3/uL (ref 0.0–0.1)
BASOS PCT: 0.9 % (ref 0.0–3.0)
EOS ABS: 0.2 10*3/uL (ref 0.0–0.7)
Eosinophils Relative: 2.3 % (ref 0.0–5.0)
HEMATOCRIT: 38.2 % (ref 36.0–46.0)
HEMOGLOBIN: 12.8 g/dL (ref 12.0–15.0)
LYMPHS PCT: 27.9 % (ref 12.0–46.0)
Lymphs Abs: 2 10*3/uL (ref 0.7–4.0)
MCHC: 33.4 g/dL (ref 30.0–36.0)
MCV: 95.1 fl (ref 78.0–100.0)
Monocytes Absolute: 0.8 10*3/uL (ref 0.1–1.0)
Monocytes Relative: 11.4 % (ref 3.0–12.0)
Neutro Abs: 4 10*3/uL (ref 1.4–7.7)
Neutrophils Relative %: 57.5 % (ref 43.0–77.0)
Platelets: 280 10*3/uL (ref 150.0–400.0)
RBC: 4.02 Mil/uL (ref 3.87–5.11)
RDW: 13.1 % (ref 11.5–15.5)
WBC: 7 10*3/uL (ref 4.0–10.5)

## 2018-04-21 LAB — BASIC METABOLIC PANEL
BUN: 19 mg/dL (ref 6–23)
CHLORIDE: 104 meq/L (ref 96–112)
CO2: 30 meq/L (ref 19–32)
Calcium: 9.7 mg/dL (ref 8.4–10.5)
Creatinine, Ser: 0.89 mg/dL (ref 0.40–1.20)
GFR: 68.15 mL/min (ref >60.00)
GLUCOSE: 95 mg/dL (ref 70–99)
POTASSIUM: 3.4 meq/L — AB (ref 3.5–5.1)
Sodium: 141 mEq/L (ref 135–145)

## 2018-04-21 LAB — HEPATIC FUNCTION PANEL
ALT: 15 U/L (ref 0–35)
AST: 20 U/L (ref 0–37)
Albumin: 4.4 g/dL (ref 3.5–5.2)
Alkaline Phosphatase: 57 U/L (ref 39–117)
BILIRUBIN TOTAL: 0.6 mg/dL (ref 0.2–1.2)
Bilirubin, Direct: 0.1 mg/dL (ref 0.0–0.3)
TOTAL PROTEIN: 7.6 g/dL (ref 6.0–8.3)

## 2018-04-21 LAB — PROTIME-INR
INR: 1 ratio (ref 0.8–1.0)
Prothrombin Time: 11.4 s (ref 9.6–13.1)

## 2018-04-21 MED ORDER — HYDROCODONE-HOMATROPINE 5-1.5 MG/5ML PO SYRP: 5.0000 mL | ORAL_SOLUTION | Freq: Four times a day (QID) | ORAL | 0 refills | Status: AC | PRN

## 2018-04-21 NOTE — Progress Notes (Signed)
Subjective:    Patient ID: Christina Waller, female    DOB: 23-Feb-1956, 62 y.o.   MRN: [id number]  HPI  Here to f/u with c/o hemoptysis (not hematemesis) and brings clear evidence of a large handful of BRB on tissues after 45 min of onset persistent coughing while simply [place] the highway.  Has hx of chronic cough thought related post nasal gtt in past.  Oct 2018 cxr neg for acute.  Did not see pulm as referred previously.  Sputum is clearly only BRB, no other color and  Pt denies fever, wt loss, night sweats, loss of appetite, or other constitutional symptoms,  No TB exposure known  Pt denies chest pain, increased sob or doe, wheezing, orthopnea, PND, increased LE swelling, palpitations, dizziness or syncope.  No other recent travel  Past Medical History:  Diagnosis Date  . Allergic rhinitis   . Anemia    NOS  . Anxiety   . Depression   . Fibromyalgia   . Headache(784.0)   . HNP (herniated nucleus pulposus), lumbar    L 2-3  . Hyperlipidemia   . Migraines   . Osteopenia   . Restless leg syndrome   . STD (sexually transmitted disease)    HSV that outbreaks mid back  . Unspecified vitamin D deficiency    Past Surgical History:  Procedure Laterality Date  . AUGMENTATION MAMMAPLASTY Bilateral 5/01   saline  . BREAST SURGERY    . COLONOSCOPY  11/05   normal recheck in 8 years  . TUBAL LIGATION     bilat    reports that she has never smoked. She has never used smokeless tobacco. She reports that she drinks about 3.0 oz of alcohol per week. She reports that she does not use drugs. family history includes Depression in her mother and sister; Heart disease in her father and maternal grandfather; Hypertension in her maternal grandmother; Osteoarthritis in her mother; Osteoporosis in her maternal grandmother; Ovarian cancer (age of onset: 66) in her mother; Ulcers in her maternal grandmother. No Known Allergies Current Outpatient Medications on File Prior to Visit  Medication  Sig Dispense Refill  . cetirizine (ZYRTEC) 10 MG tablet TAKE 1 TABLET BY MOUTH EVERY DAY 30 tablet 2  . clonazePAM (KLONOPIN) 0.5 MG tablet TAKE 1 TABLET BY MOUTH EVERY MORNING TAKE 1 TO 2 TABLETS BY MOUTH AT BEDTIME AS NEEDED 60 tablet 2  . DULoxetine (CYMBALTA) 60 MG capsule Take 1 capsule (60 mg total) by mouth daily. 90 capsule 3  . montelukast (SINGULAIR) 10 MG tablet Take 1 tablet (10 mg total) by mouth at bedtime. 90 tablet 3  . rosuvastatin (CRESTOR) 40 MG tablet TAKE 1 TABLET BY MOUTH EVERY DAY 90 tablet 1  . SUMAtriptan (IMITREX) 100 MG tablet TAKE 1 TABLET BY MOUTH AS NEEDED FOR MIGRAINE MAY REPEAT AFTER 2 HOURS IF NEEDED 12 tablet 11  . traMADol (ULTRAM) 50 MG tablet TAKE 1 TABLET (50 MG TOTAL) BY MOUTH DAILY AS NEEDED. FOR PAIN 30 tablet 2  . valACYclovir (VALTREX) 1000 MG tablet Take 1 tablet (1,000 mg total) by mouth 2 (two) times daily. 30 tablet 2   No current facility-administered medications on file prior to visit.    Review of Systems  Constitutional: Negative for other unusual diaphoresis or sweats HENT: Negative for ear discharge or swelling Eyes: Negative for other worsening visual disturbances Respiratory: Negative for stridor or other swelling  Gastrointestinal: Negative for worsening distension or other blood Genitourinary: Negative  for retention or other urinary change Musculoskeletal: Negative for other MSK pain or swelling Skin: Negative for color change or other new lesions Neurological: Negative for worsening tremors and other numbness  Psychiatric/Behavioral: Negative for worsening agitation or other fatigue All other system neg per pt    Objective:   Physical Exam BP 110/62 (BP Location: Left Arm, Patient Position: Sitting, Cuff Size: Normal)   Pulse 94   Temp 98.4 F (36.9 C) (Oral)   Ht 5\' 9"  (1.753 m)   Wt 142 lb 8 oz (64.6 kg)   LMP 07/13/2006 (Approximate)   SpO2 96%   BMI 21.04 kg/m  VS noted,  Constitutional: Pt appears in NAD HENT: Head:  NCAT.  Right Ear: External ear normal.  Left Ear: External ear normal.  Eyes: . Pupils are equal, round, and reactive to light. Conjunctivae and EOM are normal Nose: without d/c or deformity Neck: Neck supple. Gross normal ROM Cardiovascular: Normal rate and regular rhythm.   Pulmonary/Chest: Effort normal and breath sounds without rales or wheezing.  Neurological: Pt is alert. At baseline orientation, motor grossly intact Skin: Skin is warm. No rashes, other new lesions, no LE edema Psychiatric: Pt behavior is normal without agitation , mild nervous No other exam findings    Assessment & Plan:

## 2018-04-21 NOTE — Telephone Encounter (Signed)
Called in c/o coughing up blood this morning that had blood clots in it.   She has a chronic cough they are trying to figure out why but she has never coughed up blood before.  She said it was less than a tablespoon but enough to concern her.  The coughing spell lasted about 45 minutes but I have allergies and GERD so I normally have these coughing spells but never with blood.  See triage notes.  I made her an appt with Dr. Jenny Reichmann for today at 2:00. Protocol is she should be seen within 4 hours but she is taking care of her grandson (she mentioned she drove to [place], [place] this morning) and needed an afternoon appt.        Reason for Disposition . [1] Coughed up blood AND [2] > 1 tablespoon (15 ml) (Exception: blood-tinged sputum)  Answer Assessment - Initial Assessment Questions 1. ONSET: "When did you start coughing up blood?"     This morning I was going to drive to [facility].  I started coughing blood clots.   I blew my nose and no blood.   It lasted about 45 minutes.   I've had a productive for a long time.   I have GERD and allergies.   I'm used to coughing all the time.  2. SEVERITY: "How many times?" "How much blood?" (e.g., flecks, streaks, tablespoons, etc)     Not coughing now.    The first time or two I had blood clots in it.  3. COUGHING SPASMS: "Did the blood appear after a coughing spell?"       First started coughing I noticed the clots. 4. RESPIRATORY DISTRESS: "Describe your breathing."      No 5. FEVER: "Do you have a fever?" If so, ask: "What is your temperature, how was it measured, and when did it start?"     No 6. SPUTUM: "Describe the color of your sputum" (clear, white, yellow, green), "Has there been any change recently?"     I do get migraines.  I woke up with one this morning.   I took Imitrix and my headache is gone now.     I normally have these coughing spells daily.   Dr. Jenny Reichmann wants me to see a pulmonary doctor which I have not done. 7. CARDIAC HISTORY: "Do you have  any history of heart disease?" (e.g., heart attack, congestive heart failure)      No 8. LUNG HISTORY: "Do you have any history of lung disease?"  (e.g., pulmonary embolus, asthma, emphysema)     No.    I've had bronchitis and pneumonia before. 9. PE RISK FACTORS: "Do you have a history of blood clots?" (or: recent major surgery, recent prolonged travel, bedridden )     No blood clots.    Just working.  [place]   10. OTHER SYMPTOMS: "Do you have any other symptoms?" (e.g., nosebleed, chest pain, abdominal pain, vomiting)       None of the above except feeling stomach bloating.   I've had  A couple of stomach aches this week.   No V/D 11. PREGNANCY: "Is there any chance you are pregnant?" "When was your last menstrual period?"       No 12. TRAVEL: "Have you traveled out of the country in the last month?" (e.g., travel history, exposures)       No other than driving [place] coast and [place] last weekend.  Protocols used: COUGHING UP [id number]

## 2018-04-21 NOTE — Patient Instructions (Signed)
Please take all new medication as prescribed - the cough medicine  Please continue all other medications as before, and refills have been done if requested.  Please have the pharmacy call with any other refills you may need.  Please keep your appointments with your specialists as you may have planned  You will be contacted regarding the referral for: CT scan for chest and pulmonary referral  Please go to the XRAY Department in the Basement (go straight as you get off the elevator) for the x-ray testing  Please go to the LAB in the Basement (turn left off the elevator) for the tests to be done today  You will be contacted by phone if any changes need to be made immediately.  Otherwise, you will receive a letter about your results with an explanation, but please check with MyChart first.  Please remember to sign up for MyChart if you have not done so, as this will be important to you in the future with finding out test results, communicating by private email, and scheduling acute appointments online when needed.

## 2018-04-22 ENCOUNTER — Encounter: Payer: Self-pay | Admitting: Internal Medicine

## 2018-04-22 NOTE — Assessment & Plan Note (Signed)
Ok for cough med for now,  to f/u any worsening symptoms or concerns

## 2018-04-22 NOTE — Assessment & Plan Note (Addendum)
Etiology unclear, for repeat cxr today, labs as ordered, CT chest and refer pulm, likely needs bronchoscopy

## 2018-04-22 NOTE — Assessment & Plan Note (Signed)
Chronic mild, overall stable, cont same tx

## 2018-04-28 ENCOUNTER — Ambulatory Visit: Payer: Self-pay | Admitting: Certified Nurse Midwife

## 2018-05-01 ENCOUNTER — Inpatient Hospital Stay: Admission: RE | Admit: 2018-05-01 | Payer: No Typology Code available for payment source | Source: Ambulatory Visit

## 2018-05-02 ENCOUNTER — Ambulatory Visit (INDEPENDENT_AMBULATORY_CARE_PROVIDER_SITE_OTHER)
Admission: RE | Admit: 2018-05-02 | Discharge: 2018-05-02 | Disposition: A | Discharge status: Home / Self Care | Payer: No Typology Code available for payment source | Source: Ambulatory Visit | Attending: Internal Medicine | Admitting: Internal Medicine

## 2018-05-02 ENCOUNTER — Telehealth: Payer: Self-pay

## 2018-05-02 ENCOUNTER — Other Ambulatory Visit: Payer: Self-pay | Admitting: Internal Medicine

## 2018-05-02 ENCOUNTER — Encounter: Payer: Self-pay | Admitting: Internal Medicine

## 2018-05-02 DIAGNOSIS — R053 Chronic cough: Secondary | ICD-10-CM

## 2018-05-02 DIAGNOSIS — R042 Hemoptysis: Secondary | ICD-10-CM

## 2018-05-02 DIAGNOSIS — R05 Cough: Secondary | ICD-10-CM | POA: Diagnosis not present

## 2018-05-02 MED ORDER — IOPAMIDOL (ISOVUE-300) INJECTION 61%
80.0000 mL | Freq: Once | INTRAVENOUS | Status: AC | PRN
  Administered 2018-05-02: 80 mL via INTRAVENOUS

## 2018-05-02 MED ORDER — LEVOFLOXACIN 500 MG PO TABS: 500.0000 mg | ORAL_TABLET | Freq: Every day | ORAL | 0 refills | Status: AC

## 2018-05-02 NOTE — Telephone Encounter (Signed)
-----   Message from Biagio Borg, MD sent at 05/02/2018  3:14 PM EDT ----- Left message on MyChart, pt to cont same tx except  The test results show that your current treatment is OK, except there is an area of bronchiectasis and possible very mild pneumonia found on the right.  We should treat with an antibiotic. This may be the cause of the coughing up blood.  There was nothing found to suggest cancer.  I will send a prescription, and you should hear from the office about this.  Please be sure to follow up with Pulmonary as you have been referred.    Kristelle Cavallaro to please inform pt, I will do rx

## 2018-05-02 NOTE — Telephone Encounter (Signed)
Pt has been informed and expressed understanding.  

## 2018-05-04 ENCOUNTER — Inpatient Hospital Stay: Admission: RE | Admit: 2018-05-04 | Payer: No Typology Code available for payment source | Source: Ambulatory Visit

## 2018-05-09 ENCOUNTER — Other Ambulatory Visit: Payer: Self-pay | Admitting: Internal Medicine

## 2018-05-09 NOTE — Telephone Encounter (Signed)
Done erx 

## 2018-05-09 NOTE — Telephone Encounter (Signed)
03/19/2018 23# 

## 2018-05-11 ENCOUNTER — Other Ambulatory Visit: Payer: No Typology Code available for payment source

## 2018-05-12 ENCOUNTER — Other Ambulatory Visit (INDEPENDENT_AMBULATORY_CARE_PROVIDER_SITE_OTHER): Payer: No Typology Code available for payment source

## 2018-05-12 ENCOUNTER — Encounter: Payer: Self-pay | Admitting: Internal Medicine

## 2018-05-12 ENCOUNTER — Ambulatory Visit: Payer: No Typology Code available for payment source | Admitting: Internal Medicine

## 2018-05-12 VITALS — BP 98/60 | HR 78 | Ht 69.0 in | Wt 144.8 lb

## 2018-05-12 DIAGNOSIS — J479 Bronchiectasis, uncomplicated: Secondary | ICD-10-CM

## 2018-05-12 DIAGNOSIS — R042 Hemoptysis: Secondary | ICD-10-CM | POA: Diagnosis not present

## 2018-05-12 LAB — IGA: IgA: 180 mg/dL (ref 68–378)

## 2018-05-12 MED ORDER — FLUTICASONE FUROATE-VILANTEROL 100-25 MCG/INH IN AEPB: 1.0000 | INHALATION_SPRAY | Freq: Every day | RESPIRATORY_TRACT | 0 refills | Status: DC

## 2018-05-12 MED ORDER — FLUTTER DEVI: 1.0000 | 0 refills | Status: DC | PRN

## 2018-05-12 NOTE — Patient Instructions (Addendum)
ICD-10-CM   1. Bronchiectasis without complication ([place]) T41.9   2. Hemoptysis R04.2     Your condition is called bronchiectasis  Plan - check blodo for Ig A, E, G and M - check blood for ANA, RF, CCP, ssA, ssB - check blood for alpha 1 - start flutter valve 10 times daily on the hour, 5-10 times each time - start breo low dose 1 puff daily - albuterol as needed - in 4 weeks do - Pre-bd spiro and dlco only. No lung volume or bd response. No post-bd spiro  followuop In 4 weeks to report pgress - will plan bronchoscopy depending on progress and also timing of repeat CT chest Address coronary artery calcification at follow-up

## 2018-05-12 NOTE — Progress Notes (Signed)
Subjective:     Patient ID: Christina Waller, female   DOB: 1956-01-21, 62 y.o.   MRN: [id number] PCP Biagio Borg, MD  HPI  IOV 05/12/2018  Chief Complaint  Patient presents with  . Pulmonary Consult    hronic cough, tried and failed gerd medications, coughing up blood with clots,    Christina Waller 62 y.o.  female [place] [address] -   Baseline history: She has chronic cough for the last 2 years or so.  It is cough while lying down.  She is tried acid reflux medications.  It is of moderate intensity.  The cough is never relieved.  Sometimes it is associate with some pink sputum.  But she is very categorical it will happen when she is lying down supine or very rarely when she is leaning forward but mostly when she is lying down.  Generally dry cough but occasionally she has pink sputum.  No shortness of breath or wheezing with this.  No ACE inhibitor intake.  Associated postnasal drip was considered   More recent history: On Apr 21, 2018 while driving to [place] she suddenly had out of the blue hemoptysis.  She reports globs of blood.  She denied any TB exposure no constitutional symptoms.  She immediately saw Dr. Cathlean Cower.  On his note he noted that October 2018 chest x-ray was negative for any acute etiologies.  She denied any chest pain or shortness of breath orthopnea wheezing or paroxysmal nocturnal dyspnea or swelling or palpitations or dizziness or syncope.  After this the cough is improvedto baseline no further hemoptysis.  Then on May 02, 2018 she underwent CT chest with contrast that is reported below.  Right middle lobe bronchiectasis is reported.  My personal visualization there is also some amount in the lingula.  Coronary artery calcification also reported.  But she denies any chest pain.  She does yoga and does not have any symptoms.  Dr. Jenny Reichmann [place] which she is taken and this is helped her baseline cough.  Dr Lorenza [place] Reflux  Symptom Index (> 13-15 suggestive of LPR cough) 0 -> 5  =  none ->severe problem  Hoarseness of problem with voice 2  Clearing  Of Throat 3  Excess throat mucus or feeling of post nasal drip 4  Difficulty swallowing food, liquid or tablets 0  Cough after eating or lying down 5 lying  Breathing difficulties or choking episodes 0  Troublesome or annoying cough 5  Sensation of something sticking in throat or lump in throat 0  Heartburn, chest pain, indigestion, or stomach acid coming up 2  TOTAL 21      Results for Christina Waller, Christina Waller (MRN [id number]) as of 05/12/2018 15:48  Ref. Range 04/21/2018 14:42  Creatinine Latest Ref Range: 0.40 - 1.20 mg/dL 0.89   Results for Christina Waller, Christina Waller (MRN [id number]) as of 05/12/2018 15:48  Ref. Range 04/21/2018 14:42  Hemoglobin Latest Ref Range: 12.0 - 15.0 g/dL 12.8     IMPRESSION: CT chest personally visualized 1. Areas of localized bronchiectatic change in the right middle lobe. Several tree on bud type opacities in the right middle lobe suggest underlying pneumonia in this area. There is also mild consolidation in the inferior lingula. Scattered areas of scarring bilaterally elsewhere.  2.  No demonstrable adenopathy.  3. Aortic atherosclerosis. Foci of calcification in great vessels and several coronary artery regions.  4.  Hepatic steatosis.  5.  Breast implants bilaterally.  Aortic Atherosclerosis (ICD10-I70.0).   Electronically Signed   By: Lowella Grip III M.D.   On: 05/02/2018 14:22    has a past medical history of Allergic rhinitis, Anemia, Anxiety, Depression, Fibromyalgia, Headache(784.0), HNP (herniated nucleus pulposus), lumbar, Hyperlipidemia, Migraines, Osteopenia, Restless leg syndrome, STD (sexually transmitted disease), and Unspecified vitamin D deficiency.   reports that she has never smoked. She has never used smokeless tobacco.  Past Surgical History:  Procedure Laterality Date  . AUGMENTATION  MAMMAPLASTY Bilateral 5/01   saline  . BREAST SURGERY    . COLONOSCOPY  11/05   normal recheck in 8 years  . TUBAL LIGATION     bilat    No Known Allergies  Immunization History  Administered Date(s) Administered  . Influenza Split 09/03/2011  . Influenza,inj,Quad PF,6+ Mos 09/14/2013, 09/13/2014, 10/23/2015, 11/09/2016, 09/16/2017  . PPD Test 09/04/2012  . Td 03/16/2006  . Tdap 11/09/2016    Family History  Problem Relation Age of Onset  . Ovarian cancer Mother 14  . Osteoarthritis Mother   . Depression Mother   . Heart disease Father   . Depression Sister   . Hypertension Maternal Grandmother   . Osteoporosis Maternal Grandmother   . Ulcers Maternal Grandmother   . Heart disease Maternal Grandfather   . Colon cancer Neg Hx      Current Outpatient Medications:  .  cetirizine (ZYRTEC) 10 MG tablet, TAKE 1 TABLET BY MOUTH EVERY DAY, Disp: 30 tablet, Rfl: 2 .  clonazePAM (KLONOPIN) 0.5 MG tablet, TAKE 1 TABLET BY MOUTH EVERY MORNING TAKE 1 TO 2 TABLETS BY MOUTH AT BEDTIME AS NEEDED, Disp: 60 tablet, Rfl: 2 .  DULoxetine (CYMBALTA) 60 MG capsule, Take 1 capsule (60 mg total) by mouth daily., Disp: 90 capsule, Rfl: 3 .  montelukast (SINGULAIR) 10 MG tablet, Take 1 tablet (10 mg total) by mouth at bedtime., Disp: 90 tablet, Rfl: 3 .  rosuvastatin (CRESTOR) 40 MG tablet, TAKE 1 TABLET BY MOUTH EVERY DAY, Disp: 90 tablet, Rfl: 1 .  SUMAtriptan (IMITREX) 100 MG tablet, TAKE 1 TABLET BY MOUTH AS NEEDED FOR MIGRAINE MAY REPEAT AFTER 2 HOURS IF NEEDED, Disp: 12 tablet, Rfl: 11 .  traMADol (ULTRAM) 50 MG tablet, TAKE 1 TABLET (50 MG TOTAL) BY MOUTH DAILY AS NEEDED. FOR PAIN, Disp: 30 tablet, Rfl: 2 .  valACYclovir (VALTREX) 1000 MG tablet, Take 1 tablet (1,000 mg total) by mouth 2 (two) times daily., Disp: 30 tablet, Rfl: 2 .  levofloxacin (LEVAQUIN) 500 MG tablet, Take 1 tablet (500 mg total) by mouth daily for 10 days. (Patient not taking: Reported on 05/12/2018), Disp: 10 tablet,  Rfl: 0    Review of Systems     Objective:   Physical Exam  Constitutional: She is oriented to person, place, and time. She appears well-developed and well-nourished. No distress.  HENT:  Head: Normocephalic and atraumatic.  Right Ear: External ear normal.  Left Ear: External ear normal.  Mouth/Throat: Oropharynx is clear and moist. No oropharyngeal exudate.  Eyes: Pupils are equal, round, and reactive to light. Conjunctivae and EOM are normal. Right eye exhibits no discharge. Left eye exhibits no discharge. No scleral icterus.  Neck: Normal range of motion. Neck supple. No JVD present. No tracheal deviation present. No thyromegaly present.  Cardiovascular: Normal rate, regular rhythm, normal heart sounds and intact distal pulses. Exam reveals no gallop and no friction rub.  No murmur heard. Pulmonary/Chest: Effort normal and breath sounds normal. No respiratory distress. She  has no wheezes. She has no rales. She exhibits no tenderness.  Abdominal: Soft. Bowel sounds are normal. She exhibits no distension and no mass. There is no tenderness. There is no rebound and no guarding.  Musculoskeletal: Normal range of motion. She exhibits no edema or tenderness.  Lymphadenopathy:    She has no cervical adenopathy.  Neurological: She is alert and oriented to person, place, and time. She has normal reflexes. No cranial nerve deficit. She exhibits normal muscle tone. Coordination normal.  Skin: Skin is warm and dry. No rash noted. She is not diaphoretic. No erythema. No pallor.  Psychiatric: She has a normal mood and affect. Her behavior is normal. Judgment and thought content normal.  Vitals reviewed.  Today's Vitals   05/12/18 1534  BP: 98/60  Pulse: 78  SpO2: 99%  Weight: 144 lb 12.8 oz (65.7 kg)  Height: 5\' 9"  (1.753 m)    Estimated body mass index is 21.38 kg/m as calculated from the following:   Height as of this encounter: 5\' 9"  (1.753 m).   Weight as of this encounter: 144 lb  12.8 oz (65.7 kg).     Assessment:       ICD-10-CM   1. Bronchiectasis without complication ([place]) S96.2   2. Hemoptysis R04.2        Plan:      Your condition is called bronchiectasis  Plan - check blodo for Ig A, E, G and M - check blood for ANA, RF, CCP, ssA, ssB - check blood for alpha 1 - start flutter valve 10 times daily on the hour, 5-10 times each time - start breo low dose 1 puff daily - albuterol as needed - in 4 weeks do - Pre-bd spiro and dlco only. No lung volume or bd response. No post-bd spiro  followuop In 4 weeks to report pgress - will plan bronchoscopy depending on progress and and also timing of repeat CT chest Address coronary artery calcification and follow-up.   ,Dr. Brand Males, M.D., [facility].C.P Pulmonary and Critical Care Medicine Staff Physician, [place] Director - Interstitial Lung Disease  Program  Pulmonary [place] [facility] [place], [place], [address]  Pager: [phone], If no answer or between  15:00h - 7:00h: call [phone] Telephone: [phone]

## 2018-05-17 LAB — IGM: IgM, Serum: 137 mg/dL (ref 48–271)

## 2018-05-17 LAB — ALPHA-1 ANTITRYPSIN PHENOTYPE: A-1 Antitrypsin, Ser: 127 mg/dL (ref 83–199)

## 2018-05-17 LAB — RHEUMATOID FACTOR: Rhuematoid fact SerPl-aCnc: <14 IU/mL (ref <14)

## 2018-05-17 LAB — CYCLIC CITRUL PEPTIDE ANTIBODY, IGG: Cyclic Citrullin Peptide Ab: <16 UNITS

## 2018-05-17 LAB — IGG: IgG (Immunoglobin G), Serum: 850 mg/dL (ref 694–1618)

## 2018-05-17 LAB — IGE: IgE (Immunoglobulin E), Serum: 197 kU/L — ABNORMAL HIGH (ref <=114)

## 2018-05-17 LAB — ANA: Anti Nuclear Antibody(ANA): NEGATIVE

## 2018-05-19 ENCOUNTER — Institutional Professional Consult (permissible substitution): Payer: No Typology Code available for payment source | Admitting: Internal Medicine

## 2018-06-14 ENCOUNTER — Other Ambulatory Visit: Payer: Self-pay | Admitting: Internal Medicine

## 2018-06-14 NOTE — Telephone Encounter (Signed)
Done erx 

## 2018-06-30 ENCOUNTER — Ambulatory Visit: Payer: No Typology Code available for payment source | Admitting: Internal Medicine

## 2018-07-24 ENCOUNTER — Other Ambulatory Visit: Payer: Self-pay | Admitting: Internal Medicine

## 2018-08-09 ENCOUNTER — Other Ambulatory Visit: Payer: Self-pay | Admitting: Internal Medicine

## 2018-08-10 NOTE — Telephone Encounter (Signed)
Done erx 

## 2018-08-15 ENCOUNTER — Other Ambulatory Visit: Payer: Self-pay | Admitting: Internal Medicine

## 2018-08-16 NOTE — Telephone Encounter (Signed)
   LOV:04/21/18 NextOV:12/15/18 Last Filled/Quantity: 07/16/18 60#

## 2018-08-16 NOTE — Telephone Encounter (Signed)
Done erx 

## 2018-08-23 ENCOUNTER — Telehealth: Payer: Self-pay | Admitting: Internal Medicine

## 2018-08-23 MED ORDER — DULOXETINE HCL 60 MG PO CPEP: 60.0000 mg | ORAL_CAPSULE | Freq: Two times a day (BID) | ORAL | 3 refills | Status: DC

## 2018-08-23 NOTE — Telephone Encounter (Signed)
Ok for cymbalta 60 bid - done erx

## 2018-08-23 NOTE — Telephone Encounter (Signed)
Copied from [place] [phone]. Topic: General - Other >> Aug 23, 2018  8:21 AM Janace Aris A wrote: Reason for CRM: Patient would like to have the dosage upped for her medication; DULoxetine (CYMBALTA).   Please advise.

## 2018-08-23 NOTE — Addendum Note (Signed)
Addended by: Biagio Borg on: 08/23/2018 12:37 PM   Modules accepted: Orders

## 2018-09-19 ENCOUNTER — Other Ambulatory Visit: Payer: Self-pay | Admitting: Internal Medicine

## 2018-10-19 ENCOUNTER — Other Ambulatory Visit: Payer: Self-pay | Admitting: Internal Medicine

## 2018-10-21 ENCOUNTER — Other Ambulatory Visit: Payer: Self-pay | Admitting: Internal Medicine

## 2018-10-28 ENCOUNTER — Other Ambulatory Visit: Payer: Self-pay | Admitting: Internal Medicine

## 2018-11-11 ENCOUNTER — Other Ambulatory Visit: Payer: Self-pay | Admitting: Internal Medicine

## 2018-11-13 NOTE — Telephone Encounter (Signed)
   LOV:04/21/18 NextOV:12/15/18  Last Filled/Quantity:10/13/18 20#

## 2018-11-13 NOTE — Telephone Encounter (Signed)
Done erx 

## 2018-11-15 ENCOUNTER — Other Ambulatory Visit: Payer: Self-pay | Admitting: Internal Medicine

## 2018-11-15 NOTE — Telephone Encounter (Signed)
Done erx 

## 2018-12-15 ENCOUNTER — Encounter: Payer: BLUE CROSS/BLUE SHIELD | Admitting: Internal Medicine

## 2019-01-05 ENCOUNTER — Ambulatory Visit (INDEPENDENT_AMBULATORY_CARE_PROVIDER_SITE_OTHER): Payer: No Typology Code available for payment source | Admitting: Internal Medicine

## 2019-01-05 ENCOUNTER — Encounter: Payer: Self-pay | Admitting: Internal Medicine

## 2019-01-05 ENCOUNTER — Other Ambulatory Visit (INDEPENDENT_AMBULATORY_CARE_PROVIDER_SITE_OTHER): Payer: No Typology Code available for payment source

## 2019-01-05 VITALS — BP 118/76 | HR 98 | Temp 98.2°F | Ht 69.0 in | Wt 143.0 lb

## 2019-01-05 DIAGNOSIS — J479 Bronchiectasis, uncomplicated: Secondary | ICD-10-CM | POA: Insufficient documentation

## 2019-01-05 DIAGNOSIS — F32A Depression, unspecified: Secondary | ICD-10-CM

## 2019-01-05 DIAGNOSIS — F329 Major depressive disorder, single episode, unspecified: Secondary | ICD-10-CM | POA: Diagnosis not present

## 2019-01-05 DIAGNOSIS — E785 Hyperlipidemia, unspecified: Secondary | ICD-10-CM

## 2019-01-05 HISTORY — DX: Bronchiectasis, uncomplicated: J47.9

## 2019-01-05 LAB — BASIC METABOLIC PANEL
BUN: 20 mg/dL (ref 6–23)
CHLORIDE: 106 meq/L (ref 96–112)
CO2: 29 meq/L (ref 19–32)
Calcium: 9.3 mg/dL (ref 8.4–10.5)
Creatinine, Ser: 1.15 mg/dL (ref 0.40–1.20)
GFR: 47.6 mL/min — ABNORMAL LOW (ref >60.00)
Glucose, Bld: 87 mg/dL (ref 70–99)
POTASSIUM: 4 meq/L (ref 3.5–5.1)
Sodium: 141 mEq/L (ref 135–145)

## 2019-01-05 LAB — CBC WITH DIFFERENTIAL/PLATELET
BASOS PCT: 0.8 % (ref 0.0–3.0)
Basophils Absolute: 0 10*3/uL (ref 0.0–0.1)
EOS PCT: 1.3 % (ref 0.0–5.0)
Eosinophils Absolute: 0.1 10*3/uL (ref 0.0–0.7)
HEMATOCRIT: 34.6 % — AB (ref 36.0–46.0)
HEMOGLOBIN: 11.5 g/dL — AB (ref 12.0–15.0)
Lymphocytes Relative: 32.7 % (ref 12.0–46.0)
Lymphs Abs: 1.9 10*3/uL (ref 0.7–4.0)
MCHC: 33.2 g/dL (ref 30.0–36.0)
MCV: 96.3 fl (ref 78.0–100.0)
MONOS PCT: 8.7 % (ref 3.0–12.0)
Monocytes Absolute: 0.5 10*3/uL (ref 0.1–1.0)
Neutro Abs: 3.3 10*3/uL (ref 1.4–7.7)
Neutrophils Relative %: 56.5 % (ref 43.0–77.0)
Platelets: 233 10*3/uL (ref 150.0–400.0)
RBC: 3.59 Mil/uL — ABNORMAL LOW (ref 3.87–5.11)
RDW: 13.8 % (ref 11.5–15.5)
WBC: 5.9 10*3/uL (ref 4.0–10.5)

## 2019-01-05 LAB — URINALYSIS, ROUTINE W REFLEX MICROSCOPIC
Bilirubin Urine: NEGATIVE
Hgb urine dipstick: NEGATIVE
LEUKOCYTES UA: NEGATIVE
Nitrite: NEGATIVE
PH: 7 (ref 5.0–8.0)
Specific Gravity, Urine: 1.01 (ref 1.000–1.030)
Total Protein, Urine: NEGATIVE
Urine Glucose: NEGATIVE
Urobilinogen, UA: 4 — AB (ref 0.0–1.0)

## 2019-01-05 LAB — LIPID PANEL
Cholesterol: 234 mg/dL — ABNORMAL HIGH (ref 0–200)
HDL: 92.1 mg/dL (ref >39.00)
LDL Cholesterol: 119 mg/dL — ABNORMAL HIGH (ref 0–99)
NONHDL: 142.01
Total CHOL/HDL Ratio: 3
Triglycerides: 116 mg/dL (ref 0.0–149.0)
VLDL: 23.2 mg/dL (ref 0.0–40.0)

## 2019-01-05 LAB — HEPATIC FUNCTION PANEL
ALBUMIN: 4.1 g/dL (ref 3.5–5.2)
ALT: 13 U/L (ref 0–35)
AST: 17 U/L (ref 0–37)
Alkaline Phosphatase: 51 U/L (ref 39–117)
BILIRUBIN DIRECT: 0 mg/dL (ref 0.0–0.3)
TOTAL PROTEIN: 6.7 g/dL (ref 6.0–8.3)
Total Bilirubin: 0.4 mg/dL (ref 0.2–1.2)

## 2019-01-05 LAB — TSH: TSH: 1.93 u[IU]/mL (ref 0.35–4.50)

## 2019-01-05 MED ORDER — SUMATRIPTAN SUCCINATE 100 MG PO TABS: ORAL_TABLET | ORAL | 7 refills | Status: DC

## 2019-01-05 MED ORDER — TRAMADOL HCL 50 MG PO TABS: 50.0000 mg | ORAL_TABLET | Freq: Every day | ORAL | 2 refills | Status: DC | PRN

## 2019-01-05 MED ORDER — CLONAZEPAM 0.5 MG PO TABS: ORAL_TABLET | ORAL | 2 refills | Status: DC

## 2019-01-05 MED ORDER — VALACYCLOVIR HCL 1 G PO TABS: 1000.0000 mg | ORAL_TABLET | Freq: Two times a day (BID) | ORAL | 2 refills | Status: DC

## 2019-01-05 MED ORDER — DULOXETINE HCL 60 MG PO CPEP: 60.0000 mg | ORAL_CAPSULE | Freq: Two times a day (BID) | ORAL | 3 refills | Status: DC

## 2019-01-05 NOTE — Patient Instructions (Signed)

## 2019-01-05 NOTE — Progress Notes (Signed)
Subjective:    Patient ID: Christina Waller, female    DOB: [birth year], 63 y.o.   MRN: [id number]  HPI  Here for yearly f/u;  Overall doing ok;  Pt denies Chest pain, worsening SOB, DOE, wheezing, orthopnea, PND, worsening LE edema, palpitations, dizziness or syncope.  Pt denies neurological change such as new headache, facial or extremity weakness.  Pt denies polydipsia, polyuria, or low sugar symptoms. Pt states overall good compliance with treatment and medications, good tolerability, and has been trying to follow appropriate diet.  Pt denies worsening depressive symptoms, suicidal ideation or panic. No fever, night sweats, wt loss, loss of appetite, or other constitutional symptoms.  Pt states good ability with ADL's, has low fall risk, home safety reviewed and adequate, no other significant changes in hearing or vision, and only occasionally active with exercise. Incidentally fell to left knee twice today trying to help family move.  With left knee bruising but mild, trace swelling . Mother died in 11-11-23, sister now with melanoma and hx of endometrial cancer, cancer free for 1 year.  Taking statin every day now.   Past Medical History:  Diagnosis Date  . Allergic rhinitis   . Anemia    NOS  . Anxiety   . Bronchiectasis ([place]) 01/05/2019  . Depression   . Fibromyalgia   . Headache(784.0)   . HNP (herniated nucleus pulposus), lumbar    L 2-3  . Hyperlipidemia   . Migraines   . Osteopenia   . Restless leg syndrome   . STD (sexually transmitted disease)    HSV that outbreaks mid back  . Unspecified vitamin D deficiency    Past Surgical History:  Procedure Laterality Date  . AUGMENTATION MAMMAPLASTY Bilateral 5/01   saline  . BREAST SURGERY    . COLONOSCOPY  11/05   normal recheck in 8 years  . TUBAL LIGATION     bilat    reports that she has never smoked. She has never used smokeless tobacco. She reports current alcohol use of about 5.0 standard drinks of alcohol per week. She  reports that she does not use drugs. family history includes Depression in her mother and sister; Heart disease in her father and maternal grandfather; Hypertension in her maternal grandmother; Osteoarthritis in her mother; Osteoporosis in her maternal grandmother; Ovarian cancer (age of onset: 50) in her mother; Ulcers in her maternal grandmother. No Known Allergies Current Outpatient Medications on File Prior to Visit  Medication Sig Dispense Refill  . cetirizine (ZYRTEC) 10 MG tablet TAKE 1 TABLET BY MOUTH EVERY DAY 30 tablet 2  . rosuvastatin (CRESTOR) 40 MG tablet TAKE 1 TABLET BY MOUTH EVERY DAY 30 tablet 5   No current facility-administered medications on file prior to visit.    Review of Systems  Constitutional: Negative for other unusual diaphoresis or sweats HENT: Negative for ear discharge or swelling Eyes: Negative for other worsening visual disturbances Respiratory: Negative for stridor or other swelling  Gastrointestinal: Negative for worsening distension or other blood Genitourinary: Negative for retention or other urinary change Musculoskeletal: Negative for other MSK pain or swelling Skin: Negative for color change or other new lesions Neurological: Negative for worsening tremors and other numbness  Psychiatric/Behavioral: Negative for worsening agitation or other fatigue All other system neg per pt    Objective:   Physical Exam BP 118/76   Pulse 98   Temp 98.2 F (36.8 C) (Oral)   Ht 5\' 9"  (1.753 m)   Wt 143 lb (64.9  kg)   LMP 07/13/2006 (Approximate)   SpO2 98%   BMI 21.12 kg/m  VS noted,  Constitutional: Pt appears in NAD HENT: Head: NCAT.  Right Ear: External ear normal.  Left Ear: External ear normal.  Eyes: . Pupils are equal, round, and reactive to light. Conjunctivae and EOM are normal Nose: without d/c or deformity Neck: Neck supple. Gross normal ROM Cardiovascular: Normal rate and regular rhythm.   Pulmonary/Chest: Effort normal and breath sounds  without rales or wheezing.  Abd:  Soft, NT, ND, + BS, no organomegaly Neurological: Pt is alert. At baseline orientation, motor grossly intact Skin: Skin is warm. No rashes, other new lesions, no LE edema Psychiatric: Pt behavior is normal without agitation  No other exam findings Lab Results  Component Value Date   WBC 5.9 01/05/2019   HGB 11.5 (L) 01/05/2019   HCT 34.6 (L) 01/05/2019   PLT 233.0 01/05/2019   GLUCOSE 87 01/05/2019   CHOL 234 (H) 01/05/2019   TRIG 116.0 01/05/2019   HDL 92.10 01/05/2019   LDLDIRECT 133.2 09/14/2013   LDLCALC 119 (H) 01/05/2019   ALT 13 01/05/2019   AST 17 01/05/2019   NA 141 01/05/2019   K 4.0 01/05/2019   CL 106 01/05/2019   CREATININE 1.15 01/05/2019   BUN 20 01/05/2019   CO2 29 01/05/2019   TSH 1.93 01/05/2019   INR 1.0 04/21/2018       Assessment & Plan:

## 2019-01-06 NOTE — Assessment & Plan Note (Signed)
stable overall by history and exam, recent data reviewed with pt, and pt to continue medical treatment as before,  to f/u any worsening symptoms or concerns  

## 2019-01-23 ENCOUNTER — Other Ambulatory Visit: Payer: Self-pay | Admitting: Internal Medicine

## 2019-04-06 ENCOUNTER — Other Ambulatory Visit: Payer: Self-pay | Admitting: Internal Medicine

## 2019-04-06 NOTE — Telephone Encounter (Signed)
Done erx 

## 2019-04-17 ENCOUNTER — Other Ambulatory Visit: Payer: Self-pay | Admitting: Internal Medicine

## 2019-06-07 ENCOUNTER — Other Ambulatory Visit: Payer: Self-pay | Admitting: Internal Medicine

## 2019-06-07 NOTE — Telephone Encounter (Signed)
Done erx 

## 2019-07-06 ENCOUNTER — Other Ambulatory Visit: Payer: Self-pay | Admitting: Internal Medicine

## 2019-07-06 NOTE — Telephone Encounter (Signed)
Done erx 

## 2019-07-30 ENCOUNTER — Other Ambulatory Visit: Payer: Self-pay

## 2019-07-30 ENCOUNTER — Encounter: Payer: Self-pay | Admitting: Internal Medicine

## 2019-07-30 ENCOUNTER — Ambulatory Visit (INDEPENDENT_AMBULATORY_CARE_PROVIDER_SITE_OTHER): Payer: No Typology Code available for payment source | Admitting: Internal Medicine

## 2019-07-30 DIAGNOSIS — Z20822 Contact with and (suspected) exposure to covid-19: Secondary | ICD-10-CM

## 2019-07-30 DIAGNOSIS — B349 Viral infection, unspecified: Secondary | ICD-10-CM | POA: Diagnosis not present

## 2019-07-30 DIAGNOSIS — Z20828 Contact with and (suspected) exposure to other viral communicable diseases: Secondary | ICD-10-CM | POA: Diagnosis not present

## 2019-07-30 NOTE — Assessment & Plan Note (Signed)
Symptoms consistent with a viral illness ?  COVID or other viral cause Will have her tested for COVID-ordered Discussed that she needs to quarantine herself until the result comes back and then should not return to work as long as she has symptoms even if negative Symptomatic treatment She will call with any questions or concerns

## 2019-07-30 NOTE — Progress Notes (Signed)
Virtual Visit via Video Note  I connected with Christina Waller on 07/30/19 at 10:00 AM EDT by a video enabled telemedicine application and verified that I am speaking with the correct person using two identifiers.   I discussed the limitations of evaluation and management by telemedicine and the availability of in person appointments. The patient expressed understanding and agreed to proceed.  The patient is currently at home and I am in the office.    No referring provider.    History of Present Illness: This is an acute visit for   Her symptoms started yesterday afternoon.  She states fatigue, body aches, headaches, some chills and feeling like she has a fever, but she has not had a temperature when she has checked it.  She has some nausea, diarrhea associated with some abdominal cramping.  She denies any cold symptoms including congestion, sore throat and cough.  He has not had any change in taste.  She knew that GI symptoms were possible symptom of COVID, which is why she was concerned.  She is a Copywriter, advertising.  No known exposures.     Review of Systems  Constitutional: Positive for chills and malaise/fatigue. Negative for fever.  HENT: Negative for congestion and sore throat.        No loss of taste  Respiratory: Negative for cough, shortness of breath and wheezing.   Gastrointestinal: Positive for abdominal pain (cramping), diarrhea and nausea.  Musculoskeletal: Positive for myalgias.  Neurological: Positive for headaches.     Social History   Socioeconomic History  . Marital status: Divorced    Spouse name: Not on file  . Number of children: 3  . Years of education: Not on file  . Highest education level: Not on file  Occupational History  . Occupation: Garment/textile technologist: [employer]  Social Needs  . Financial resource strain: Not on file  . Food insecurity    Worry: Not on file    Inability: Not on file  . Transportation needs    Medical: Not on  file    Non-medical: Not on file  Tobacco Use  . Smoking status: Never Smoker  . Smokeless tobacco: Never Used  Substance and Sexual Activity  . Alcohol use: Yes    Alcohol/week: 5.0 standard drinks    Types: 5 Standard drinks or equivalent per week  . Drug use: No  . Sexual activity: Yes    Partners: Male    Birth control/protection: Surgical    Comment: BTL  Lifestyle  . Physical activity    Days per week: Not on file    Minutes per session: Not on file  . Stress: Not on file  Relationships  . Social Herbalist on phone: Not on file    Gets together: Not on file    Attends religious service: Not on file    Active member of club or organization: Not on file    Attends meetings of clubs or organizations: Not on file    Relationship status: Not on file  Other Topics Concern  . Not on file  Social History Narrative   Daily caffeine use 1 per day     Observations/Objective: Appears well in NAD Breathing normally  Assessment and Plan:  See Problem List for Assessment and Plan of chronic medical problems.   Follow Up Instructions:    I discussed the assessment and treatment plan with the patient. The patient was provided an opportunity  to ask questions and all were answered. The patient agreed with the plan and demonstrated an understanding of the instructions.   The patient was advised to call back or seek an in-person evaluation if the symptoms worsen or if the condition fails to improve as anticipated.    Binnie Rail, MD

## 2019-08-01 ENCOUNTER — Encounter: Payer: Self-pay | Admitting: Internal Medicine

## 2019-08-01 DIAGNOSIS — R6883 Chills (without fever): Secondary | ICD-10-CM

## 2019-08-01 DIAGNOSIS — R5383 Other fatigue: Secondary | ICD-10-CM

## 2019-08-01 DIAGNOSIS — M2550 Pain in unspecified joint: Secondary | ICD-10-CM

## 2019-08-01 DIAGNOSIS — R519 Headache, unspecified: Secondary | ICD-10-CM

## 2019-08-01 LAB — SPECIMEN STATUS REPORT

## 2019-08-01 LAB — NOVEL CORONAVIRUS, NAA: SARS-CoV-2, NAA: NOT DETECTED

## 2019-08-02 ENCOUNTER — Other Ambulatory Visit: Payer: Self-pay

## 2019-08-02 DIAGNOSIS — Z20822 Contact with and (suspected) exposure to covid-19: Secondary | ICD-10-CM

## 2019-08-03 ENCOUNTER — Encounter: Payer: Self-pay | Admitting: Internal Medicine

## 2019-08-03 LAB — NOVEL CORONAVIRUS, NAA: SARS-CoV-2, NAA: NOT DETECTED

## 2019-09-14 ENCOUNTER — Ambulatory Visit: Payer: No Typology Code available for payment source | Admitting: Certified Nurse Midwife

## 2019-09-27 ENCOUNTER — Other Ambulatory Visit: Payer: Self-pay | Admitting: Internal Medicine

## 2019-09-29 ENCOUNTER — Other Ambulatory Visit: Payer: Self-pay | Admitting: Internal Medicine

## 2019-10-02 ENCOUNTER — Other Ambulatory Visit: Payer: Self-pay | Admitting: Internal Medicine

## 2019-10-11 ENCOUNTER — Other Ambulatory Visit: Payer: Self-pay | Admitting: Internal Medicine

## 2019-10-11 NOTE — Telephone Encounter (Signed)
Done erx 

## 2019-10-26 ENCOUNTER — Other Ambulatory Visit: Payer: Self-pay | Admitting: Internal Medicine

## 2019-10-26 NOTE — Telephone Encounter (Signed)
Done erx 

## 2020-01-08 ENCOUNTER — Other Ambulatory Visit: Payer: Self-pay | Admitting: Internal Medicine

## 2020-01-08 NOTE — Telephone Encounter (Signed)
Ok to let pt know -   Klonopin refill done for 1 mo  Please make return OV for further refills

## 2020-01-09 ENCOUNTER — Encounter: Payer: Self-pay | Admitting: Internal Medicine

## 2020-01-09 NOTE — Telephone Encounter (Signed)
Pt has already made cpx appt for 02/01/20.Marland KitchenJohny Waller

## 2020-01-11 ENCOUNTER — Encounter: Payer: No Typology Code available for payment source | Admitting: Internal Medicine

## 2020-01-12 ENCOUNTER — Other Ambulatory Visit: Payer: Self-pay | Admitting: Internal Medicine

## 2020-01-12 NOTE — Telephone Encounter (Signed)
cymbalta one erx x  1 Mo  Please to contact pt - please to make rov for further refills

## 2020-01-13 ENCOUNTER — Other Ambulatory Visit: Payer: Self-pay | Admitting: Internal Medicine

## 2020-01-14 ENCOUNTER — Other Ambulatory Visit: Payer: Self-pay | Admitting: Internal Medicine

## 2020-02-01 ENCOUNTER — Encounter: Payer: Self-pay | Admitting: Internal Medicine

## 2020-02-01 ENCOUNTER — Telehealth: Payer: Self-pay

## 2020-02-01 ENCOUNTER — Other Ambulatory Visit: Payer: Self-pay

## 2020-02-01 ENCOUNTER — Ambulatory Visit (INDEPENDENT_AMBULATORY_CARE_PROVIDER_SITE_OTHER): Payer: No Typology Code available for payment source | Admitting: Internal Medicine

## 2020-02-01 VITALS — BP 120/60 | HR 92 | Temp 98.6°F | Ht 69.0 in | Wt 142.0 lb

## 2020-02-01 DIAGNOSIS — Z0000 Encounter for general adult medical examination without abnormal findings: Secondary | ICD-10-CM | POA: Diagnosis not present

## 2020-02-01 DIAGNOSIS — E611 Iron deficiency: Secondary | ICD-10-CM

## 2020-02-01 DIAGNOSIS — E538 Deficiency of other specified B group vitamins: Secondary | ICD-10-CM

## 2020-02-01 DIAGNOSIS — E785 Hyperlipidemia, unspecified: Secondary | ICD-10-CM

## 2020-02-01 DIAGNOSIS — E559 Vitamin D deficiency, unspecified: Secondary | ICD-10-CM | POA: Diagnosis not present

## 2020-02-01 LAB — URINALYSIS, ROUTINE W REFLEX MICROSCOPIC
Bilirubin Urine: NEGATIVE
Hgb urine dipstick: NEGATIVE
Ketones, ur: NEGATIVE
Leukocytes,Ua: NEGATIVE
Nitrite: NEGATIVE
Specific Gravity, Urine: 1.02 (ref 1.000–1.030)
Total Protein, Urine: NEGATIVE
Urine Glucose: NEGATIVE
Urobilinogen, UA: 0.2 (ref 0.0–1.0)
pH: 6 (ref 5.0–8.0)

## 2020-02-01 LAB — CBC WITH DIFFERENTIAL/PLATELET
Basophils Absolute: 0 10*3/uL (ref 0.0–0.1)
Basophils Relative: 0.8 % (ref 0.0–3.0)
Eosinophils Absolute: 0.1 10*3/uL (ref 0.0–0.7)
Eosinophils Relative: 2.7 % (ref 0.0–5.0)
HCT: 36 % (ref 36.0–46.0)
Hemoglobin: 12 g/dL (ref 12.0–15.0)
Lymphocytes Relative: 32.1 % (ref 12.0–46.0)
Lymphs Abs: 1.6 10*3/uL (ref 0.7–4.0)
MCHC: 33.4 g/dL (ref 30.0–36.0)
MCV: 94.3 fl (ref 78.0–100.0)
Monocytes Absolute: 0.5 10*3/uL (ref 0.1–1.0)
Monocytes Relative: 9 % (ref 3.0–12.0)
Neutro Abs: 2.8 10*3/uL (ref 1.4–7.7)
Neutrophils Relative %: 55.4 % (ref 43.0–77.0)
Platelets: 248 10*3/uL (ref 150.0–400.0)
RBC: 3.82 Mil/uL — ABNORMAL LOW (ref 3.87–5.11)
RDW: 12.5 % (ref 11.5–15.5)
WBC: 5 10*3/uL (ref 4.0–10.5)

## 2020-02-01 LAB — BASIC METABOLIC PANEL
BUN: 21 mg/dL (ref 6–23)
CO2: 29 mEq/L (ref 19–32)
Calcium: 9.4 mg/dL (ref 8.4–10.5)
Chloride: 104 mEq/L (ref 96–112)
Creatinine, Ser: 0.91 mg/dL (ref 0.40–1.20)
GFR: 62.15 mL/min (ref >60.00)
Glucose, Bld: 94 mg/dL (ref 70–99)
Potassium: 4.5 mEq/L (ref 3.5–5.1)
Sodium: 140 mEq/L (ref 135–145)

## 2020-02-01 LAB — HEPATIC FUNCTION PANEL
ALT: 16 U/L (ref 0–35)
AST: 20 U/L (ref 0–37)
Albumin: 4.1 g/dL (ref 3.5–5.2)
Alkaline Phosphatase: 61 U/L (ref 39–117)
Bilirubin, Direct: 0.1 mg/dL (ref 0.0–0.3)
Total Bilirubin: 0.4 mg/dL (ref 0.2–1.2)
Total Protein: 6.7 g/dL (ref 6.0–8.3)

## 2020-02-01 LAB — LIPID PANEL
Cholesterol: 217 mg/dL — ABNORMAL HIGH (ref 0–200)
HDL: 73.1 mg/dL (ref >39.00)
LDL Cholesterol: 119 mg/dL — ABNORMAL HIGH (ref 0–99)
NonHDL: 143.47
Total CHOL/HDL Ratio: 3
Triglycerides: 122 mg/dL (ref 0.0–149.0)
VLDL: 24.4 mg/dL (ref 0.0–40.0)

## 2020-02-01 LAB — IBC PANEL
Iron: 87 ug/dL (ref 42–145)
Saturation Ratios: 30.3 % (ref 20.0–50.0)
Transferrin: 205 mg/dL — ABNORMAL LOW (ref 212.0–360.0)

## 2020-02-01 LAB — TSH: TSH: 1.83 u[IU]/mL (ref 0.35–4.50)

## 2020-02-01 LAB — VITAMIN D 25 HYDROXY (VIT D DEFICIENCY, FRACTURES): VITD: 34.52 ng/mL (ref 30.00–100.00)

## 2020-02-01 LAB — VITAMIN B12: Vitamin B-12: 459 pg/mL (ref 211–911)

## 2020-02-01 MED ORDER — ROSUVASTATIN CALCIUM 40 MG PO TABS: 40.0000 mg | ORAL_TABLET | Freq: Every day | ORAL | 3 refills | Status: DC

## 2020-02-01 MED ORDER — SUMATRIPTAN SUCCINATE 100 MG PO TABS: ORAL_TABLET | ORAL | 11 refills | Status: DC

## 2020-02-01 MED ORDER — CLONAZEPAM 0.5 MG PO TABS: ORAL_TABLET | ORAL | 5 refills | Status: DC

## 2020-02-01 MED ORDER — CYCLOBENZAPRINE HCL 10 MG PO TABS: 10.0000 mg | ORAL_TABLET | Freq: Every day | ORAL | 2 refills | Status: DC

## 2020-02-01 MED ORDER — TRAMADOL HCL 50 MG PO TABS: 50.0000 mg | ORAL_TABLET | Freq: Every day | ORAL | 2 refills | Status: DC | PRN

## 2020-02-01 MED ORDER — DULOXETINE HCL 60 MG PO CPEP: 60.0000 mg | ORAL_CAPSULE | Freq: Two times a day (BID) | ORAL | 3 refills | Status: DC

## 2020-02-01 MED ORDER — MONTELUKAST SODIUM 10 MG PO TABS: 10.0000 mg | ORAL_TABLET | Freq: Every day | ORAL | 3 refills | Status: DC

## 2020-02-01 NOTE — Patient Instructions (Signed)
We have discussed the Cardiac CT Score test to measure the calcification level (if any) in your heart arteries.  This test has been ordered in our [place], so please call [facility] directly, as they prefer this, at [phone] to be scheduled.  Please remember to call for you yearly GYN pap and mammogram, as well as the Skin full exam  Please continue all other medications as before, and refills have been done if requested.  Please have the pharmacy call with any other refills you may need.  Please continue your efforts at being more active, low cholesterol diet, and weight control.  You are otherwise up to date with prevention measures today.  Please keep your appointments with your specialists as you may have planned  Please go to the LAB at the blood drawing area for the tests to be done  You will be contacted by phone if any changes need to be made immediately.  Otherwise, you will receive a letter about your results with an explanation, but please check with MyChart first.  Please remember to sign up for MyChart if you have not done so, as this will be important to you in the future with finding out test results, communicating by private email, and scheduling acute appointments online when needed.  Please make an Appointment to return for your 1 year visit, or sooner if needed

## 2020-02-01 NOTE — Telephone Encounter (Signed)
Pt wanted to know if another provider would do her PAP. Her previous GYN retired.   Please advise, I will contact patient and let her know.

## 2020-02-01 NOTE — Telephone Encounter (Signed)
Pt informed and has been scheduled.

## 2020-02-01 NOTE — Progress Notes (Signed)
Subjective:    Patient ID: Christina Waller, female    DOB: [birth year], 64 y.o.   MRN: [id number]  HPI  Here for wellness and f/u;  Overall doing ok;  Pt denies Chest pain, worsening SOB, DOE, wheezing, orthopnea, PND, worsening LE edema, palpitations, dizziness or syncope.  Pt denies neurological change such as new headache, facial or extremity weakness.  Pt denies polydipsia, polyuria, or low sugar symptoms. Pt states overall good compliance with treatment and medications, good tolerability, and has been trying to follow appropriate diet.  Pt denies worsening depressive symptoms, suicidal ideation or panic. No fever, night sweats, wt loss, loss of appetite, or other constitutional symptoms.  Pt states good ability with ADL's, has low fall risk, home safety reviewed and adequate, no other significant changes in hearing or vision, and only occasionally active with exercise.  Still working as Development worker, community, plans to work to [employer] if possible then part time. S/p COVID shots x 2.  No new complaints Past Medical History:  Diagnosis Date  . Allergic rhinitis   . Anemia    NOS  . Anxiety   . Bronchiectasis ([place]) 01/05/2019  . Depression   . Fibromyalgia   . Headache(784.0)   . HNP (herniated nucleus pulposus), lumbar    L 2-3  . Hyperlipidemia   . Migraines   . Osteopenia   . Restless leg syndrome   . STD (sexually transmitted disease)    HSV that outbreaks mid back  . Unspecified vitamin D deficiency    Past Surgical History:  Procedure Laterality Date  . AUGMENTATION MAMMAPLASTY Bilateral 5/01   saline  . BREAST SURGERY    . COLONOSCOPY  11/05   normal recheck in 8 years  . TUBAL LIGATION     bilat    reports that she has never smoked. She has never used smokeless tobacco. She reports current alcohol use of about 5.0 standard drinks of alcohol per week. She reports that she does not use drugs. family history includes Depression in her mother and sister; Heart disease in her father  and maternal grandfather; Hypertension in her maternal grandmother; Osteoarthritis in her mother; Osteoporosis in her maternal grandmother; Ovarian cancer (age of onset: 70) in her mother; Ulcers in her maternal grandmother. No Known Allergies Current Outpatient Medications on File Prior to Visit  Medication Sig Dispense Refill  . ibuprofen (ADVIL) 800 MG tablet Take 800 mg by mouth 3 (three) times daily.    . valACYclovir (VALTREX) 1000 MG tablet Take 1 tablet (1,000 mg total) by mouth 2 (two) times daily. 30 tablet 2   No current facility-administered medications on file prior to visit.   Review of Systems All otherwise neg per pt'    Objective:   Physical Exam BP 120/60 (BP Location: Left Arm, Patient Position: Sitting, Cuff Size: Normal)   Pulse 92   Temp 98.6 F (37 C) (Oral)   Ht 5\' 9"  (1.753 m)   Wt 142 lb (64.4 kg)   LMP 07/13/2006 (Approximate)   SpO2 98%   BMI 20.97 kg/m  VS noted,  Constitutional: Pt appears in NAD HENT: Head: NCAT.  Right Ear: External ear normal.  Left Ear: External ear normal.  Eyes: . Pupils are equal, round, and reactive to light. Conjunctivae and EOM are normal Nose: without d/c or deformity Neck: Neck supple. Gross normal ROM Cardiovascular: Normal rate and regular rhythm.   Pulmonary/Chest: Effort normal and breath sounds without rales or wheezing.  Abd:  Soft, NT,  ND, + BS, no organomegaly Neurological: Pt is alert. At baseline orientation, motor grossly intact Skin: Skin is warm. No rashes, other new lesions, no LE edema Psychiatric: Pt behavior is normal without agitation  All otherwise neg per pt Lab Results  Component Value Date   WBC 5.9 01/05/2019   HGB 11.5 (L) 01/05/2019   HCT 34.6 (L) 01/05/2019   PLT 233.0 01/05/2019   GLUCOSE 87 01/05/2019   CHOL 234 (H) 01/05/2019   TRIG 116.0 01/05/2019   HDL 92.10 01/05/2019   LDLDIRECT 133.2 09/14/2013   LDLCALC 119 (H) 01/05/2019   ALT 13 01/05/2019   AST 17 01/05/2019   NA 141  01/05/2019   K 4.0 01/05/2019   CL 106 01/05/2019   CREATININE 1.15 01/05/2019   BUN 20 01/05/2019   CO2 29 01/05/2019   TSH 1.93 01/05/2019   INR 1.0 04/21/2018         Assessment & Plan:

## 2020-02-01 NOTE — Assessment & Plan Note (Signed)
[place] for ct cardiac score

## 2020-02-01 NOTE — Assessment & Plan Note (Signed)

## 2020-02-15 ENCOUNTER — Encounter: Payer: Self-pay | Admitting: Internal Medicine

## 2020-02-22 ENCOUNTER — Ambulatory Visit: Payer: No Typology Code available for payment source | Admitting: Family

## 2020-03-03 ENCOUNTER — Ambulatory Visit
Admission: EM | Admit: 2020-03-03 | Discharge: 2020-03-03 | Disposition: A | Discharge status: Home / Self Care | Payer: No Typology Code available for payment source | Attending: Emergency Medicine | Admitting: Emergency Medicine

## 2020-03-03 DIAGNOSIS — S61211A Laceration without foreign body of left index finger without damage to nail, initial encounter: Secondary | ICD-10-CM | POA: Diagnosis not present

## 2020-03-03 DIAGNOSIS — S61412A Laceration without foreign body of left hand, initial encounter: Secondary | ICD-10-CM

## 2020-03-03 DIAGNOSIS — L03114 Cellulitis of left upper limb: Secondary | ICD-10-CM | POA: Diagnosis not present

## 2020-03-03 DIAGNOSIS — S61213A Laceration without foreign body of left middle finger without damage to nail, initial encounter: Secondary | ICD-10-CM

## 2020-03-03 DIAGNOSIS — W25XXXA Contact with sharp glass, initial encounter: Secondary | ICD-10-CM

## 2020-03-03 MED ORDER — CEPHALEXIN 500 MG PO CAPS: 500.0000 mg | ORAL_CAPSULE | Freq: Four times a day (QID) | ORAL | 0 refills | Status: DC

## 2020-03-03 NOTE — ED Provider Notes (Signed)
[name]    CSN: [id number] Arrival date & time: 03/03/20  0857      History   Chief Complaint Chief Complaint  Patient presents with  . Extremity Laceration    left hand    HPI SHIYA KESSELRING is a 64 y.o. female.   Patient presents with lacerations to her left index finger MCP and middle finger MCP.  These occurred last night while she was washing dishes and he had a broken glass.  Bleeding controlled with pressure.  She denies numbness, tingling, weakness in her fingers or hands.  Tetanus 2017.    The history is provided by the patient.    Past Medical History:  Diagnosis Date  . Allergic rhinitis   . Anemia    NOS  . Anxiety   . Bronchiectasis ([place]) 01/05/2019  . Depression   . Fibromyalgia   . Headache(784.0)   . HNP (herniated nucleus pulposus), lumbar    L 2-3  . Hyperlipidemia   . Migraines   . Osteopenia   . Restless leg syndrome   . STD (sexually transmitted disease)    HSV that outbreaks mid back  . Unspecified vitamin D deficiency     Patient Active Problem List   Diagnosis Date Noted  . Viral illness 07/30/2019  . Bronchiectasis ([place]) 01/05/2019  . Chronic cough 09/16/2017  . Acute upper respiratory infection 03/30/2017  . Migraine 03/30/2017  . Cough 11/09/2016  . Bunion, left foot 10/23/2015  . Skin lesion 10/23/2015  . Lower back pain 09/15/2014  . Right hip pain 09/14/2013  . Preventative health care 06/29/2011  . HEMORRHOIDS-EXTERNAL 01/09/2010  . [place] NONINFECTIOUS GASTROENTERITIS&COLITIS 12/02/2009  . OSTEOPENIA 02/14/2009  . FATIGUE 08/16/2008  . DYSPNEA 08/16/2008  . CARPAL TUNNEL SYNDROME, LEFT 05/21/2008  . HLD (hyperlipidemia) 04/12/2008  . ANEMIA-NOS 04/12/2008  . Allergic rhinitis 04/12/2008  . Anxiety state 10/31/2007  . Depression 10/31/2007  . FIBROMYALGIA 10/31/2007    Past Surgical History:  Procedure Laterality Date  . AUGMENTATION MAMMAPLASTY Bilateral 5/01   saline  . BREAST SURGERY    .  COLONOSCOPY  11/05   normal recheck in 8 years  . TUBAL LIGATION     bilat    OB History    Gravida  3   Para  3   Term  3   Preterm  0   AB  0   Living  3     SAB  0   TAB  0   Ectopic  0   Multiple  0   Live Births  3            Home Medications    Prior to Admission medications   Medication Sig Start Date End Date Taking? Authorizing Provider  clonazePAM (KLONOPIN) 0.5 MG tablet 1 tab by mouth twice per day as needed 02/01/20  Yes Biagio Borg, MD  cyclobenzaprine (FLEXERIL) 10 MG tablet Take 1 tablet (10 mg total) by mouth at bedtime. 02/01/20  Yes Biagio Borg, MD  DULoxetine (CYMBALTA) 60 MG capsule Take 1 capsule (60 mg total) by mouth 2 (two) times daily. 02/01/20 01/31/21 Yes Biagio Borg, MD  ibuprofen (ADVIL) 800 MG tablet Take 800 mg by mouth 3 (three) times daily. 01/17/20  Yes [provider]  montelukast (SINGULAIR) 10 MG tablet Take 1 tablet (10 mg total) by mouth at bedtime. 02/01/20  Yes Biagio Borg, MD  rosuvastatin (CRESTOR) 40 MG tablet Take 1 tablet (40 mg  total) by mouth daily. 02/01/20  Yes Biagio Borg, MD  SUMAtriptan (IMITREX) 100 MG tablet PLEASE SEE ATTACHED FOR DETAILED DIRECTIONS 02/01/20  Yes Biagio Borg, MD  traMADol (ULTRAM) 50 MG tablet Take 1 tablet (50 mg total) by mouth daily as needed. for pain 02/01/20  Yes Biagio Borg, MD  valACYclovir (VALTREX) 1000 MG tablet Take 1 tablet (1,000 mg total) by mouth 2 (two) times daily. 01/05/19  Yes Biagio Borg, MD  cephALEXin (KEFLEX) 500 MG capsule Take 1 capsule (500 mg total) by mouth 4 (four) times daily. 03/03/20   Sharion Balloon, NP    Family History Family History  Problem Relation Age of Onset  . Ovarian cancer Mother 28  . Osteoarthritis Mother   . Depression Mother   . Heart disease Father   . Depression Sister   . Hypertension Maternal Grandmother   . Osteoporosis Maternal Grandmother   . Ulcers Maternal Grandmother   . Heart disease Maternal Grandfather   .  Colon cancer Neg Hx     Social History Social History   Tobacco Use  . Smoking status: Never Smoker  . Smokeless tobacco: Never Used  Substance Use Topics  . Alcohol use: Yes    Alcohol/week: 5.0 standard drinks    Types: 5 Standard drinks or equivalent per week  . Drug use: No     Allergies   Clindamycin/lincomycin   Review of Systems Review of Systems  Constitutional: Negative for chills and fever.  HENT: Negative for ear pain and sore throat.   Eyes: Negative for pain and visual disturbance.  Respiratory: Negative for cough and shortness of breath.   Cardiovascular: Negative for chest pain and palpitations.  Gastrointestinal: Negative for abdominal pain and vomiting.  Genitourinary: Negative for dysuria and hematuria.  Musculoskeletal: Negative for arthralgias and back pain.  Skin: Positive for wound. Negative for color change and rash.  Neurological: Negative for seizures, syncope, weakness and numbness.  All other systems reviewed and are negative.    Physical Exam Triage Vital Signs ED Triage Vitals  Enc Vitals Group     BP --      Pulse --      Resp --      Temp --      Temp src --      SpO2 --      Weight 03/03/20 0906 142 lb (64.4 kg)     Height 03/03/20 0906 5\' 9"  (1.753 m)     Head Circumference --      Peak Flow --      Pain Score 03/03/20 0905 4     Pain Loc --      Pain Edu? --      Excl. in [place]? --    No data found.  Updated Vital Signs BP 109/65 (BP Location: Right Arm)   Pulse 85   Temp 99.1 F (37.3 C) (Oral)   Resp 17   Ht 5\' 9"  (1.753 m)   Wt 142 lb (64.4 kg)   LMP 07/13/2006 (Approximate)   SpO2 97%   BMI 20.97 kg/m   Visual Acuity Right Eye Distance:   Left Eye Distance:   Bilateral Distance:    Right Eye Near:   Left Eye Near:    Bilateral Near:     Physical Exam Vitals and nursing note reviewed.  Constitutional:      General: She is not in acute distress.    Appearance: She is well-developed.  HENT:  Head:  Normocephalic and atraumatic.     Mouth/Throat:     Mouth: Mucous membranes are moist.  Eyes:     Conjunctiva/sclera: Conjunctivae normal.  Cardiovascular:     Rate and Rhythm: Normal rate and regular rhythm.     Heart sounds: No murmur.  Pulmonary:     Effort: Pulmonary effort is normal. No respiratory distress.     Breath sounds: Normal breath sounds.  Abdominal:     Palpations: Abdomen is soft.     Tenderness: There is no abdominal tenderness. There is no guarding or rebound.  Musculoskeletal:        General: Swelling and tenderness present. Normal range of motion.       Hands:     Cervical back: Neck supple.  Skin:    General: Skin is warm and dry.     Capillary Refill: Capillary refill takes less than 2 seconds.     Findings: Erythema and lesion present.  Neurological:     General: No focal deficit present.     Mental Status: She is alert and oriented to person, place, and time.     Sensory: No sensory deficit.     Motor: No weakness.     Coordination: Coordination normal.  Psychiatric:        Mood and Affect: Mood normal.        Behavior: Behavior normal.      UC Treatments / Results  Labs (all labs ordered are listed, but only abnormal results are displayed) Labs Reviewed - No data to display  EKG   Radiology No results found.  Procedures Laceration Repair  Date/Time: 03/03/2020 10:02 AM Performed by: Sharion Balloon, NP Authorized by: Sharion Balloon, NP   Consent:    Consent obtained:  Verbal   Consent given by:  Patient   Risks discussed:  Infection, pain and poor wound healing Anesthesia (see MAR for exact dosages):    Anesthesia method:  Local infiltration   Local anesthetic:  Lidocaine 2% w/o epi Laceration details:    Location:  Hand   Hand location:  L hand, dorsum   Length (cm):  1   Depth (mm):  2 Repair type:    Repair type:  Simple Pre-procedure details:    Preparation:  Patient was prepped and draped in usual sterile  fashion Exploration:    Hemostasis achieved with:  Direct pressure   Wound exploration: wound explored through full range of motion and entire depth of wound probed and visualized     Contaminated: no   Treatment:    Area cleansed with:  Shur-Clens   Amount of cleaning:  Standard   Irrigation solution:  Sterile saline   Irrigation volume:  2   Irrigation method:  Syringe   Visualized foreign bodies/material removed: no   Skin repair:    Repair method:  Sutures   Suture size:  4-0   Suture material:  Nylon Approximation:    Approximation:  Close Post-procedure details:    Dressing:  Antibiotic ointment and non-adherent dressing   Patient tolerance of procedure:  Tolerated well, no immediate complications Laceration Repair  Date/Time: 03/03/2020 10:05 AM Performed by: Sharion Balloon, NP Authorized by: Sharion Balloon, NP   Consent:    Consent obtained:  Verbal   Consent given by:  Patient   Risks discussed:  Infection, pain and poor wound healing Anesthesia (see MAR for exact dosages):    Anesthesia method:  Local infiltration   Local anesthetic:  Lidocaine 2% w/o epi Laceration details:    Location:  Hand   Hand location:  L hand, dorsum   Length (cm):  1   Depth (mm):  2 Repair type:    Repair type:  Simple Pre-procedure details:    Preparation:  Patient was prepped and draped in usual sterile fashion Exploration:    Hemostasis achieved with:  Direct pressure   Wound exploration: wound explored through full range of motion and entire depth of wound probed and visualized     Contaminated: no   Treatment:    Area cleansed with:  Shur-Clens   Amount of cleaning:  Standard   Irrigation solution:  Sterile saline   Irrigation method:  Syringe   Visualized foreign bodies/material removed: no   Skin repair:    Repair method:  Sutures   Suture size:  4-0   Suture material:  Nylon   Number of sutures:  2 Approximation:    Approximation:  Close Post-procedure details:     Dressing:  Antibiotic ointment and non-adherent dressing   Patient tolerance of procedure:  Tolerated well, no immediate complications   (including critical care time)  Medications Ordered in UC Medications - No data to display  Initial Impression / Assessment and Plan / UC Course  I have reviewed the triage vital signs and the nursing notes.  Pertinent labs & imaging results that were available during my care of the patient were reviewed by me and considered in my medical decision making (see chart for details).    2 lacerations on left dorsum of hand at the index finger MCP and middle finger MCP.  Cellulitis.  2 sutures applied to each laceration = 4 sutures in total.  Treating with Keflex.  Wound care instructions and signs of worsening infection discussed with patient.  Instructed patient to have her sutures removed in 7 to 10 days; discussed that she can return here for this or see her PCP.  Instructed her to return here if she notes signs of worsening infection.  Patient agrees to plan of care.   Final Clinical Impressions(s) / UC Diagnoses   Final diagnoses:  Laceration of left hand without foreign body, initial encounter  Cellulitis of left hand     Discharge Instructions     Your stitches need to be removed in 7-10 days.  You can return here for this or follow up with your PCP.    Take the antibiotic as directed.    Keep your wound clean and dry.  Wash it gently twice a day with soap and water.  Apply an antibiotic cream and dressing twice a day.    Return here if you see signs of infection, such as increased pain, redness, pus-like drainage, warmth, fever, chills, or other concerning symptoms.       ED Prescriptions    Medication Sig Dispense Auth. Provider   cephALEXin (KEFLEX) 500 MG capsule Take 1 capsule (500 mg total) by mouth 4 (four) times daily. 20 capsule Sharion Balloon, NP     PDMP not reviewed this encounter.   Sharion Balloon, NP 03/03/20 1014

## 2020-03-03 NOTE — ED Triage Notes (Signed)
Pt presents with c/o laceration to the left first and second knuckles that occurred last night. She was washing dishes and a glass was broken. Pt has full ROM in the fingers.Pt does have some pain. Pt last Tetanus was 2017.

## 2020-03-03 NOTE — Discharge Instructions (Addendum)
Your stitches need to be removed in 7-10 days.  You can return here for this or follow up with your PCP.    Take the antibiotic as directed.    Keep your wound clean and dry.  Wash it gently twice a day with soap and water.  Apply an antibiotic cream and dressing twice a day.    Return here if you see signs of infection, such as increased pain, redness, pus-like drainage, warmth, fever, chills, or other concerning symptoms.

## 2020-03-04 ENCOUNTER — Encounter: Payer: Self-pay | Admitting: Certified Nurse Midwife

## 2020-03-11 ENCOUNTER — Ambulatory Visit
Admission: EM | Admit: 2020-03-11 | Discharge: 2020-03-11 | Disposition: A | Discharge status: Home / Self Care | Payer: No Typology Code available for payment source

## 2020-03-11 NOTE — ED Triage Notes (Signed)
Pt presents to UC with left hand pain and left wrist swelling and pain x 1 day. Pt reports she fell over her left hip and tried to catch herself with her left hand when she was walking the dog. Pt denies any numbness or tingling sensation.

## 2020-03-24 ENCOUNTER — Other Ambulatory Visit: Payer: Self-pay | Admitting: Orthopaedic Surgery

## 2020-03-24 DIAGNOSIS — S62009A Unspecified fracture of navicular [scaphoid] bone of unspecified wrist, initial encounter for closed fracture: Secondary | ICD-10-CM

## 2020-03-24 DIAGNOSIS — S62002A Unspecified fracture of navicular [scaphoid] bone of left wrist, initial encounter for closed fracture: Secondary | ICD-10-CM

## 2020-04-06 ENCOUNTER — Other Ambulatory Visit: Payer: Self-pay | Admitting: Internal Medicine

## 2020-06-19 ENCOUNTER — Telehealth: Payer: Self-pay | Admitting: Internal Medicine

## 2020-06-19 NOTE — Telephone Encounter (Signed)
   Patient calling to report episodes of SOB Appointment 7/9  Call transferred to [facility]

## 2020-06-19 NOTE — Telephone Encounter (Signed)
0940: This RN rec'd call from Amy, [facility] Access RN to discuss pt decision to follow recommended advice to go to ER for chest pain.  Spoke with patient who states that this am she began to feel chest pain in mid of chest as 5/10 on pain scale & only when she takes a deep breath.  Pt states she does not feel SOB or radiating pain. Pt request to appt to f/u with PCP & is aware that he (& no other office provider) has openings today & states she will come in tomorrow at 1340 to meet with Dr Jenny Reichmann with concerns that chest pain is r/t bronchiectasis. Pt instructed that if symptoms worsen and/or radiating pain, SOB that she should contact 911.  Pt verb understanding.

## 2020-06-20 ENCOUNTER — Ambulatory Visit: Payer: No Typology Code available for payment source | Admitting: Internal Medicine

## 2020-07-21 ENCOUNTER — Telehealth: Payer: Self-pay

## 2020-07-21 NOTE — Telephone Encounter (Signed)
New message    Need a referral to [facility].

## 2020-07-22 ENCOUNTER — Other Ambulatory Visit: Payer: Self-pay | Admitting: Internal Medicine

## 2020-07-22 NOTE — Telephone Encounter (Signed)
Done erx 

## 2020-07-25 NOTE — Telephone Encounter (Signed)
This might be possible........Marland Kitchenbut I cannot read minds and have no obvious reason to do so .......[name]

## 2020-08-22 ENCOUNTER — Other Ambulatory Visit (HOSPITAL_COMMUNITY)
Admission: RE | Admit: 2020-08-22 | Discharge: 2020-08-22 | Disposition: A | Discharge status: Home / Self Care | Payer: PPO | Source: Ambulatory Visit | Attending: Family | Admitting: Family

## 2020-08-22 ENCOUNTER — Other Ambulatory Visit: Payer: Self-pay

## 2020-08-22 ENCOUNTER — Ambulatory Visit (INDEPENDENT_AMBULATORY_CARE_PROVIDER_SITE_OTHER): Payer: PPO | Admitting: Family

## 2020-08-22 ENCOUNTER — Other Ambulatory Visit: Payer: Self-pay | Admitting: Family

## 2020-08-22 VITALS — BP 120/70 | HR 88 | Temp 98.5°F | Ht 69.0 in | Wt 140.4 lb

## 2020-08-22 DIAGNOSIS — Z124 Encounter for screening for malignant neoplasm of cervix: Secondary | ICD-10-CM | POA: Diagnosis not present

## 2020-08-22 DIAGNOSIS — Z1231 Encounter for screening mammogram for malignant neoplasm of breast: Secondary | ICD-10-CM

## 2020-08-22 DIAGNOSIS — Z1211 Encounter for screening for malignant neoplasm of colon: Secondary | ICD-10-CM

## 2020-08-22 DIAGNOSIS — Z1151 Encounter for screening for human papillomavirus (HPV): Secondary | ICD-10-CM | POA: Diagnosis not present

## 2020-08-22 NOTE — Progress Notes (Signed)
Christina Waller is a 64 y.o. female with the following history as recorded in EpicCare:  Patient Active Problem List   Diagnosis Date Noted  . Viral illness 07/30/2019  . Bronchiectasis ([place]) 01/05/2019  . Chronic cough 09/16/2017  . Acute upper respiratory infection 03/30/2017  . Migraine 03/30/2017  . Cough 11/09/2016  . Bunion, left foot 10/23/2015  . Skin lesion 10/23/2015  . Lower back pain 09/15/2014  . Right hip pain 09/14/2013  . Preventative health care 06/29/2011  . HEMORRHOIDS-EXTERNAL 01/09/2010  . [place] NONINFECTIOUS GASTROENTERITIS&COLITIS 12/02/2009  . OSTEOPENIA 02/14/2009  . FATIGUE 08/16/2008  . DYSPNEA 08/16/2008  . CARPAL TUNNEL SYNDROME, LEFT 05/21/2008  . HLD (hyperlipidemia) 04/12/2008  . ANEMIA-NOS 04/12/2008  . Allergic rhinitis 04/12/2008  . Anxiety state 10/31/2007  . Depression 10/31/2007  . FIBROMYALGIA 10/31/2007    Current Outpatient Medications  Medication Sig Dispense Refill  . Cetirizine HCl (ZYRTEC ALLERGY) 10 MG CAPS Zyrtec    . clonazePAM (KLONOPIN) 0.5 MG tablet 1 tab by mouth twice per day as needed 60 tablet 5  . cyclobenzaprine (FLEXERIL) 10 MG tablet Take 1 tablet (10 mg total) by mouth at bedtime. 30 tablet 2  . DULoxetine (CYMBALTA) 60 MG capsule TAKE 1 CAPSULE (60 MG TOTAL) BY MOUTH 2 (TWO) TIMES DAILY. 180 capsule 2  . ibuprofen (ADVIL) 800 MG tablet Take 800 mg by mouth 3 (three) times daily.    . montelukast (SINGULAIR) 10 MG tablet Take 1 tablet (10 mg total) by mouth at bedtime. 90 tablet 3  . rosuvastatin (CRESTOR) 40 MG tablet Take 1 tablet (40 mg total) by mouth daily. 90 tablet 3  . SUMAtriptan (IMITREX) 100 MG tablet PLEASE SEE ATTACHED FOR DETAILED DIRECTIONS 10 tablet 11  . traMADol (ULTRAM) 50 MG tablet TAKE 1 TABLET (50 MG TOTAL) BY MOUTH DAILY AS NEEDED. FOR PAIN 30 tablet 2  . valACYclovir (VALTREX) 1000 MG tablet Take 1 tablet (1,000 mg total) by mouth 2 (two) times daily. 30 tablet 2  . cephALEXin (KEFLEX) 500  MG capsule Take 1 capsule (500 mg total) by mouth 4 (four) times daily. (Patient not taking: Reported on 08/22/2020) 20 capsule 0  . DULoxetine (CYMBALTA) 30 MG capsule Cymbalta 30 mg capsule,delayed release (Patient not taking: Reported on 08/22/2020)    . SUMAtriptan (IMITREX) 100 MG tablet Imitrex (Patient not taking: Reported on 08/22/2020)     No current facility-administered medications for this visit.    Allergies: Clindamycin/lincomycin  Past Medical History:  Diagnosis Date  . Allergic rhinitis   . Anemia    NOS  . Anxiety   . Bronchiectasis ([place]) 01/05/2019  . Depression   . Fibromyalgia   . Headache(784.0)   . HNP (herniated nucleus pulposus), lumbar    L 2-3  . Hyperlipidemia   . Migraines   . Osteopenia   . Restless leg syndrome   . STD (sexually transmitted disease)    HSV that outbreaks mid back  . Unspecified vitamin D deficiency     Past Surgical History:  Procedure Laterality Date  . AUGMENTATION MAMMAPLASTY Bilateral 5/01   saline  . BREAST SURGERY    . COLONOSCOPY  11/05   normal recheck in 8 years  . TUBAL LIGATION     bilat    Family History  Problem Relation Age of Onset  . Ovarian cancer Mother 49  . Osteoarthritis Mother   . Depression Mother   . Heart disease Father   . Depression Sister   . Hypertension  Maternal Grandmother   . Osteoporosis Maternal Grandmother   . Ulcers Maternal Grandmother   . Heart disease Maternal Grandfather   . Colon cancer Neg Hx     Social History   Tobacco Use  . Smoking status: Never Smoker  . Smokeless tobacco: Never Used  Substance Use Topics  . Alcohol use: Yes    Alcohol/week: 5.0 standard drinks    Types: 5 Standard drinks or equivalent per week    Subjective:   Requesting updated pap smear today; her GYN has retired and did not feel she needed to establish with new GYN; No complaints today; last pap was 2 years ago; no vaginal bleeding/ spotting; Also due for mammogram and requesting referral for  colonoscopy;   Objective:  Vitals:   08/22/20 1022  BP: 120/70  Pulse: 88  Temp: 98.5 F (36.9 C)  TempSrc: Oral  SpO2: 98%  Weight: 140 lb 6.4 oz (63.7 kg)  Height: 5\' 9"  (1.753 m)    General: Well developed, well nourished, in no acute distress  Head: Normocephalic and atraumatic  Lungs: Respirations unlabored; Neurologic: Alert and oriented; speech intact; face symmetrical; moves all extremities well; CNII-XII intact without focal deficit  Pelvic exam: normal external genitalia, vulva, vagina, cervix, uterus and adnexa.  Assessment:  1. Cervical cancer screening   2. Screening mammogram, encounter for     Plan:  1. Thin Prep pap collected; discussed guidelines to check every 3-5 years; 2. Order updated;  Keep planned follow-up with her PCP for CPE early next year;   This visit occurred during the SARS-CoV-2 public health emergency.  Safety protocols were in place, including screening questions prior to the visit, additional usage of staff PPE, and extensive cleaning of exam room while observing appropriate contact time as indicated for disinfecting solutions.     No follow-ups on file.  Orders Placed This Encounter  Procedures  . MM Digital Screening    Standing Status:   Future    Standing Expiration Date:   08/22/2021    Scheduling Instructions:     With implants    Order Specific Question:   Reason for Exam (SYMPTOM  OR DIAGNOSIS REQUIRED)    Answer:   screening mammogram    Order Specific Question:   Preferred imaging location?    Answer:   [facility]    Requested Prescriptions    No prescriptions requested or ordered in this encounter

## 2020-08-25 LAB — CYTOLOGY - PAP
Chlamydia: NEGATIVE
Comment: NEGATIVE
Comment: NEGATIVE
Comment: NEGATIVE
Comment: NORMAL
Diagnosis: NEGATIVE
High risk HPV: NEGATIVE
Neisseria Gonorrhea: NEGATIVE
Trichomonas: NEGATIVE

## 2020-09-05 ENCOUNTER — Other Ambulatory Visit: Payer: Self-pay | Admitting: Internal Medicine

## 2020-09-05 NOTE — Telephone Encounter (Signed)
Done erx 

## 2020-09-12 ENCOUNTER — Ambulatory Visit
Admission: RE | Admit: 2020-09-12 | Discharge: 2020-09-12 | Disposition: A | Discharge status: Home / Self Care | Payer: PPO | Source: Ambulatory Visit | Attending: Family | Admitting: Family

## 2020-09-12 ENCOUNTER — Other Ambulatory Visit: Payer: Self-pay | Admitting: Family

## 2020-09-12 ENCOUNTER — Other Ambulatory Visit: Payer: Self-pay

## 2020-09-12 DIAGNOSIS — Z1231 Encounter for screening mammogram for malignant neoplasm of breast: Secondary | ICD-10-CM | POA: Diagnosis not present

## 2020-09-26 DIAGNOSIS — H5213 Myopia, bilateral: Secondary | ICD-10-CM | POA: Diagnosis not present

## 2020-09-26 DIAGNOSIS — H16223 Keratoconjunctivitis sicca, not specified as Sjogren's, bilateral: Secondary | ICD-10-CM | POA: Diagnosis not present

## 2020-10-15 ENCOUNTER — Encounter: Payer: Self-pay | Admitting: Family

## 2020-11-02 ENCOUNTER — Other Ambulatory Visit: Payer: Self-pay | Admitting: Internal Medicine

## 2020-11-05 ENCOUNTER — Ambulatory Visit (INDEPENDENT_AMBULATORY_CARE_PROVIDER_SITE_OTHER): Payer: PPO | Admitting: Orthopaedic Surgery

## 2020-11-05 ENCOUNTER — Encounter: Payer: Self-pay | Admitting: Orthopaedic Surgery

## 2020-11-05 ENCOUNTER — Other Ambulatory Visit: Payer: Self-pay

## 2020-11-05 ENCOUNTER — Ambulatory Visit (INDEPENDENT_AMBULATORY_CARE_PROVIDER_SITE_OTHER): Payer: PPO

## 2020-11-05 VITALS — Ht 69.0 in | Wt 144.0 lb

## 2020-11-05 DIAGNOSIS — M25552 Pain in left hip: Secondary | ICD-10-CM

## 2020-11-05 MED ORDER — METHYLPREDNISOLONE ACETATE 40 MG/ML IJ SUSP
40.0000 mg | INTRAMUSCULAR | Status: AC | PRN
  Administered 2020-11-05: 40 mg via INTRA_ARTICULAR

## 2020-11-05 MED ORDER — LIDOCAINE HCL 1 % IJ SOLN
3.0000 mL | INTRAMUSCULAR | Status: AC | PRN
  Administered 2020-11-05: 3 mL

## 2020-11-05 NOTE — Progress Notes (Signed)
Office Visit Note   Patient: Christina Waller           Date of Birth: July 09, 1956           MRN: [id number] Visit Date: 11/05/2020              Requested by: Biagio Borg, MD [address] [place],  [place] [address] PCP: Biagio Borg, MD   Assessment & Plan: Visit Diagnoses:  1. Pain in left hip     Plan: She shown IT band stretching exercises.  We will see how she does with the injection for trochanteric bursitis.  If pain persist or becomes worse she can follow-up with [place].  Questions were encouraged and answered by Dr. Ninfa Linden and myself.  Follow-Up Instructions: Return if symptoms worsen or fail to improve.   Orders:  Orders Placed This Encounter  Procedures   Large Joint Inj   XR HIP UNILAT W OR W/O PELVIS 1V LEFT   No orders of the defined types were placed in this encounter.     Procedures: Large Joint Inj: L greater trochanter on 11/05/2020 4:49 PM Indications: pain Details: 22 G 1.5 in needle, lateral approach  Arthrogram: No  Medications: 3 mL lidocaine 1 %; 40 mg methylPREDNISolone acetate 40 MG/ML Outcome: tolerated well, no immediate complications Procedure, treatment alternatives, risks and benefits explained, specific risks discussed. Consent was given by the patient. Immediately prior to procedure a time out was called to verify the correct patient, procedure, equipment, support staff and site/side marked as required. Patient was prepped and draped in the usual sterile fashion.       Clinical Data: No additional findings.   Subjective: Chief Complaint  Patient presents with   Left Hip - Pain    HPI Christina Waller is a 64 year old female were seen for left hip pain that is been ongoing for years.  Is overall getting worse.  She states if she is lying on her left side she has increased pain.  She denies any numbness tingling down the leg.  Notes some back pain..  No groin pain.  She said no treatment for this.  Negative for fevers chills.  No  recent vaccines.  She is nondiabetic. Review of Systems See HPI  Objective: Vital Signs: Ht 5\' 9"  (1.753 m)    Wt 144 lb (65.3 kg)    LMP 07/13/2006 (Approximate)    BMI 21.27 kg/m   Physical Exam Constitutional:      Appearance: She is not ill-appearing or diaphoretic.  Neurological:     Mental Status: She is alert and oriented to person, place, and time.  Psychiatric:        Mood and Affect: Mood normal.     Ortho Exam Bilateral hips excellent range of motion.  Extremes of internal or external rotation of the left hip causes discomfort.  She has tenderness over the left trochanteric region.  Negative straight leg raise bilaterally. Specialty Comments:  No specialty comments available.  Imaging: XR HIP UNILAT W OR W/O PELVIS 1V LEFT  Result Date: 11/05/2020 AP pelvis and lateral view of the left hip: No acute fractures.  Bilateral hips well located.  Both hips are well maintained with no significant arthritic changes.    PMFS History: Patient Active Problem List   Diagnosis Date Noted   Viral illness 07/30/2019   Bronchiectasis ([place]) 01/05/2019   Chronic cough 09/16/2017   Acute upper respiratory infection 03/30/2017   Migraine 03/30/2017   Cough  11/09/2016   Bunion, left foot 10/23/2015   Skin lesion 10/23/2015   Lower back pain 09/15/2014   Right hip pain 09/14/2013   Preventative health care 06/29/2011   HEMORRHOIDS-EXTERNAL 01/09/2010   OTH&UNSPEC NONINFECTIOUS GASTROENTERITIS&COLITIS 12/02/2009   OSTEOPENIA 02/14/2009   FATIGUE 08/16/2008   DYSPNEA 08/16/2008   CARPAL TUNNEL SYNDROME, LEFT 05/21/2008   HLD (hyperlipidemia) 04/12/2008   ANEMIA-NOS 04/12/2008   Allergic rhinitis 04/12/2008   Anxiety state 10/31/2007   Depression 10/31/2007   FIBROMYALGIA 10/31/2007   Past Medical History:  Diagnosis Date   Allergic rhinitis    Anemia    NOS   Anxiety    Bronchiectasis ([place]) 01/05/2019   Depression    Fibromyalgia     Headache(784.0)    HNP (herniated nucleus pulposus), lumbar    L 2-3   Hyperlipidemia    Migraines    Osteopenia    Restless leg syndrome    STD (sexually transmitted disease)    HSV that outbreaks mid back   Unspecified vitamin D deficiency     Family History  Problem Relation Age of Onset   Ovarian cancer Mother 3   Osteoarthritis Mother    Depression Mother    Heart disease Father    Depression Sister    Hypertension Maternal Grandmother    Osteoporosis Maternal Grandmother    Ulcers Maternal Grandmother    Heart disease Maternal Grandfather    Colon cancer Neg Hx     Past Surgical History:  Procedure Laterality Date   AUGMENTATION MAMMAPLASTY Bilateral 5/01   saline   BREAST SURGERY     COLONOSCOPY  11/05   normal recheck in 8 years   TUBAL LIGATION     bilat   Social History   Occupational History   Occupation: Garment/textile technologist: [employer]  Tobacco Use   Smoking status: Never Smoker   Smokeless tobacco: Never Used  Vaping Use   Vaping Use: Never used  Substance and Sexual Activity   Alcohol use: Yes    Alcohol/week: 5.0 standard drinks    Types: 5 Standard drinks or equivalent per week   Drug use: No   Sexual activity: Yes    Partners: Male    Birth control/protection: Surgical    Comment: BTL

## 2020-11-11 ENCOUNTER — Other Ambulatory Visit: Payer: Self-pay | Admitting: Internal Medicine

## 2020-11-15 ENCOUNTER — Other Ambulatory Visit: Payer: Self-pay | Admitting: Internal Medicine

## 2020-11-17 ENCOUNTER — Ambulatory Visit: Payer: PPO | Admitting: Orthopaedic Surgery

## 2020-12-03 ENCOUNTER — Encounter: Payer: Self-pay | Admitting: Orthopaedic Surgery

## 2020-12-03 ENCOUNTER — Ambulatory Visit: Payer: PPO | Admitting: Orthopaedic Surgery

## 2020-12-03 ENCOUNTER — Other Ambulatory Visit: Payer: Self-pay

## 2020-12-03 DIAGNOSIS — M25552 Pain in left hip: Secondary | ICD-10-CM | POA: Diagnosis not present

## 2020-12-03 DIAGNOSIS — M7062 Trochanteric bursitis, left hip: Secondary | ICD-10-CM

## 2020-12-03 NOTE — Progress Notes (Signed)
The patient is a very pleasant 64 year old female who is following up for her left hip.  She saw Artis Delay the last time and he appropriately placed a steroid injection around her trochanteric area.  She says that day and that night it was great with the 2 weeks since then is not really gotten any better.  It is still over the trochanteric area and IT band it hurts to walk.  She is a very active 64 year old female and feels like she is losing her level of activity as she deals with left hip pain.  She denies any pain in the groin.  She denies any back issues.  Examination of her left hip shows full and fluid range of motion with no pain in the groin at all.  She has pain to palpation of the trochanteric area and IT band.  She has negative straight leg raise and no radicular symptoms.  I did look at the x-rays from her last visit and it shows normal-appearing hips.  She does have significant arthritic changes in the lower aspect of her lumbar spine.  At this point I do feel that she would benefit from formal outpatient physical therapy with any modalities that can help decrease the pain of her left hip trochanteric area and IT band.  Hopefully therapy will even consider dry needling if they feel that will be beneficial.  The patient does agree with the treatment plan.  We will see her back in 6 weeks after course of formal outpatient physical therapy to treat her trochanteric bursitis and IT band syndrome on the left side.  I would even consider repeat steroid injection in the trochanteric area and IT band at her next visit if needed.  All questions and concerns were answered and addressed.

## 2020-12-04 ENCOUNTER — Other Ambulatory Visit: Payer: Self-pay

## 2020-12-04 DIAGNOSIS — M7062 Trochanteric bursitis, left hip: Secondary | ICD-10-CM

## 2020-12-04 DIAGNOSIS — M25552 Pain in left hip: Secondary | ICD-10-CM

## 2020-12-25 ENCOUNTER — Ambulatory Visit: Payer: PPO | Admitting: Rehabilitative and Restorative Service Providers"

## 2020-12-25 ENCOUNTER — Encounter: Payer: Self-pay | Admitting: Rehabilitative and Restorative Service Providers"

## 2020-12-25 ENCOUNTER — Other Ambulatory Visit: Payer: Self-pay | Admitting: Internal Medicine

## 2020-12-25 ENCOUNTER — Other Ambulatory Visit: Payer: Self-pay

## 2020-12-25 DIAGNOSIS — R262 Difficulty in walking, not elsewhere classified: Secondary | ICD-10-CM | POA: Diagnosis not present

## 2020-12-25 DIAGNOSIS — M25552 Pain in left hip: Secondary | ICD-10-CM | POA: Diagnosis not present

## 2020-12-25 DIAGNOSIS — M6281 Muscle weakness (generalized): Secondary | ICD-10-CM

## 2020-12-25 DIAGNOSIS — M545 Low back pain, unspecified: Secondary | ICD-10-CM | POA: Diagnosis not present

## 2020-12-25 DIAGNOSIS — G8929 Other chronic pain: Secondary | ICD-10-CM | POA: Diagnosis not present

## 2020-12-25 NOTE — Patient Instructions (Signed)
Access Code: [facility] URL: [email or web address] Date: 12/25/2020 Prepared by: Scot Jun  Exercises Supine Bridge - 2 x daily - 7 x weekly - 3 sets - 10 reps - 2 hold Figure 4 Bridge - 2 x daily - 7 x weekly - 3 sets - 10 reps Sidelying Hip Abduction - 2 x daily - 7 x weekly - 3 sets - 10 reps Supine Piriformis Stretch with Foot on Ground - 2 x daily - 7 x weekly - 5 sets - 30 reps Supine Piriformis Stretch - 2 x daily - 7 x weekly - 1 sets - 5 reps - 30 hold

## 2020-12-25 NOTE — Therapy (Signed)
[place], [place], [address] Phone: [phone]   Fax:  [phone]  Physical Therapy Evaluation  Patient Details  Name: Christina Waller MRN: [id number] Date of Birth: 11-28-1956 Referring Provider (PT): Dr. Ninfa Linden   Encounter Date: 12/25/2020   PT End of Session - 12/25/20 1718    Visit Number 1    Number of Visits 12    Date for PT Re-Evaluation 02/20/21    Progress Note Due on Visit 10    PT Start Time [id number]    PT Stop Time [id number]    PT Time Calculation (min) 35 min    Activity Tolerance Patient tolerated treatment well;No increased pain    Behavior During Therapy WFL for tasks assessed/performed           Past Medical History:  Diagnosis Date  . Allergic rhinitis   . Anemia    NOS  . Anxiety   . Bronchiectasis ([place]) 01/05/2019  . Depression   . Fibromyalgia   . Headache(784.0)   . HNP (herniated nucleus pulposus), lumbar    L 2-3  . Hyperlipidemia   . Migraines   . Osteopenia   . Restless leg syndrome   . STD (sexually transmitted disease)    HSV that outbreaks mid back  . Unspecified vitamin D deficiency     Past Surgical History:  Procedure Laterality Date  . AUGMENTATION MAMMAPLASTY Bilateral 5/01   saline  . BREAST SURGERY    . COLONOSCOPY  11/05   normal recheck in 8 years  . TUBAL LIGATION     bilat    There were no vitals filed for this visit.    Subjective Assessment - 12/25/20 1614    Subjective Patient presents with Left hip pain that she described as deep posterior pain that comes and goes. She reports some catching with walking and pain that happens about 10 minutes into walking. This has been going on for ~6 months that can sometimes radiate into her low middle back. Pain is resting around 2 or 3 but can get up to a 7 at worst.    Pertinent History L greater trochanter Hip injection 11/05/2020    Limitations Walking;Standing;Sitting    How long can you walk comfortably? 10  minutes without a break    Diagnostic tests Xray imaging 11/05/2020 -""AP pelvis and lateral view of the left hip: No acute fractures.  Bilateral hips well located.  Both hips are well maintained with no significant arthritic changes"    Patient Stated Goals get back to doing more yoga, hiking, walking without pain, and reduce ache while working    Currently in Pain? Yes    Pain Score 3     Pain Location Hip    Pain Orientation Left;Posterior    Pain Descriptors / Indicators Aching;Dull    Pain Type Chronic pain    Pain Radiating Towards toward mid back at times    Pain Onset More than a month ago    Pain Frequency Intermittent    Aggravating Factors  walking, standing, activities like biking/hiking/yoga    Pain Relieving Factors streching and hot baths    Effect of Pain on Daily Activities limits patients hobbies like hiking, yoga, walking and work             12/26/20 0001  Exercises  Other Exercises  HEP instruction/performance c cues for techniques c handout provided.  Trial set of each exercise per Pt. instruction handout for proper technique  review.      [facility] PT Assessment - 12/25/20 1640      Assessment   Medical Diagnosis Lt hip pain, bursitis    Onset Date/Surgical Date 06/24/20    Hand Dominance Left    Prior Therapy none      Precautions   Precautions None      Restrictions   Weight Bearing Restrictions No      Balance Screen   Has the patient fallen in the past 6 months No    How many times? 0   patient did slip once on stairs but cauht herself   Has the patient had a decrease in activity level because of a fear of falling?  No    Is the patient reluctant to leave their home because of a fear of falling?  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    Leisure hiking, walking, yoga, biking      Cognition   Overall Cognitive Status Within Functional Limits for tasks assessed    Attention Focused    Focused Attention Appears intact    Memory Appears intact    Awareness Appears intact    Problem Solving Appears intact      Observation/Other Assessments   Focus on Therapeutic Outcomes (FOTO)  intake 40%, outcome expected 56%      Functional Tests   Functional tests Single leg stance;Sit to Stand      Single Leg Stance   Comments R-30 sec; L-8sec slight trendelenberg      Sit to Stand   Comments no abberant motion      Posture/Postural Control   Posture/Postural Control No significant limitations      AROM   Right Hip Extension --   WFL   Right Hip Flexion --   [facility]   Right Hip External Rotation  --   [facility]   Right Hip Internal Rotation  --   WFL   Right Hip ABduction --   WFL   Right Hip ADduction --   WFL   Left Hip Extension --   [facility]   Left Hip Flexion --   [facility]   Left Hip External Rotation  --   [facility]   Left Hip Internal Rotation  --   [facility]   Left Hip ABduction --   WFL   Left Hip ADduction --   [facility]   Lumbar Flexion 100%   painfree   Lumbar Extension 50%   slight pain in hip/back region     PROM   Right Hip Flexion 90    Right Hip External Rotation  --   WFL   Right Hip Internal Rotation  --   [facility]   Left Hip Flexion 90   equal bilaterally and painfree   Left Hip External Rotation  --   WFL   Left Hip Internal Rotation  --   [facility]     Strength   Right Hip Flexion 5/5    Right Hip Extension 5/5    Right Hip ABduction 4/5    Left Hip Flexion 5/5    Left Hip Extension  4/5    Left Hip ABduction 3/5   pain indicated   Right Knee Flexion 5/5    Right Knee Extension 5/5    Left Knee Flexion 5/5    Left Knee Extension 5/5    Right Ankle Dorsiflexion 5/5    Left Ankle Plantar Flexion 5/5      Palpation   Palpation  comment TTP and trigger points palpable over glute med and glute max. No tenderness along IT band or the greater trochanter                      Objective measurements completed on examination: See above findings.       [facility] PT Treatment/Exercise - 12/26/20 0001      Exercises   Other Exercises  HEP instruction/performance c cues for techniques c handout provided.  Trial set of each exercise per Pt. instruction handout for proper technique review.                  PT Education - 12/25/20 1717    Education Details HEP, POC, DN    Person(s) Educated Patient    Methods Explanation;Demonstration;Handout    Comprehension Verbalized understanding;Returned demonstration            PT Short Term Goals - 12/25/20 1731      PT SHORT TERM GOAL #1   Title Patient will show early compliance and understanding  for HEP    Baseline HEP provided and demonstrated 12/25/20    Time 2    Period Weeks    Status New    Target Date 01/08/21             PT Long Term Goals - 12/25/20 1732      PT LONG TERM GOAL #1   Title Patient will increase Left hip abduction and extension strength to 4+/5 for increased function    Baseline patient presents with L hip abd-3/5, and L hip ext-4/5    Time 8    Period Weeks    Status New    Target Date 02/19/21      PT LONG TERM GOAL #2   Title Patient will be able to walk 30 minutes with pain under  2/10    Baseline walking becomes painful at 10 minutes    Time 8    Period Weeks    Status New    Target Date 02/20/21      PT LONG TERM GOAL #3   Title Patient will demonstrate/report pain at worst less than or equal to 2/10 to facilitate minimal limitation in daily activity secondary to pain symptoms.    Time 8    Period Weeks    Status New    Target Date 02/20/21      PT LONG TERM GOAL #4   Title Pt. will demonstrate bilateral SLS 30 seconds s deviation to facilitate stability in standing/walking activity.    Time 8     Period Weeks    Status New    Target Date 02/20/21      PT LONG TERM GOAL #5   Title FOTO outcome 56% or greater to indicated reduced disability due to symptoms.    Time 8    Period Weeks    Status New    Target Date 02/20/21                  Plan - 12/25/20 1606    Clinical Impression Statement Patient presents  with generalized weakness of the L hip abductors and L hip extensors with deep intermittent pain which is impacting her ability to participate in her hobbies. She is limited in her ability to walk or hike due to catching and pain in her hip that eases slowly with rest. Denies N/T or radiating symptoms except to her lumbar spine at times. The pain originates at the hip and presents more as glute weakness than lumbar radiculopathy. She will benefit from skilled PT to develop hip strength and improve balance and stability for improved function and return to leisure activities.    Examination-Activity Limitations Stand;Locomotion Level    Examination-Participation Restrictions Community Activity;Occupation    Stability/Clinical Decision Making Stable/Uncomplicated    Clinical Decision Making Low    Rehab Potential Excellent    PT Frequency 2x / week   1-2x/week   PT Duration 8 weeks    PT Treatment/Interventions ADLs/Self Care Home Management;Cryotherapy;Electrical Stimulation;Iontophoresis 4mg /ml Dexamethasone;Moist Heat;Balance training;Therapeutic exercise;Therapeutic activities;Functional mobility training;Stair training;Gait training;Ultrasound;Neuromuscular re-education;DME Instruction;Patient/family education;Manual techniques;Taping;Spinal Manipulations;Joint Manipulations;Dry needling;Passive range of motion    PT Next Visit Plan Check in with HEP, ask about DN, introduce more exercises to address weak hip abductors/extensors for decreased pain and increased function, balance    PT Home Exercise Plan [facility]    Consulted and Agree with Plan of Care Patient            Patient will benefit from skilled therapeutic intervention in order to improve the following deficits and impairments:  Difficulty walking,Decreased activity tolerance,Decreased balance,Decreased mobility,Decreased strength,Pain,Decreased endurance,Decreased coordination,Impaired perceived functional ability,Increased fascial restricitons  Visit Diagnosis: Pain in left hip  Chronic midline low back pain, unspecified whether sciatica present  Muscle weakness (generalized)  Difficulty in walking, not elsewhere classified     Problem List Patient Active Problem List   Diagnosis Date Noted  . Viral illness 07/30/2019  . Bronchiectasis ([place]) 01/05/2019  . Chronic cough 09/16/2017  . Acute upper respiratory infection 03/30/2017  . Migraine 03/30/2017  . Cough 11/09/2016  . Bunion, left foot 10/23/2015  . Skin lesion 10/23/2015  . Lower back pain 09/15/2014  . Right hip pain 09/14/2013  . Preventative health care 06/29/2011  . HEMORRHOIDS-EXTERNAL 01/09/2010  . [place] NONINFECTIOUS GASTROENTERITIS&COLITIS 12/02/2009  . OSTEOPENIA 02/14/2009  . FATIGUE 08/16/2008  . DYSPNEA 08/16/2008  . CARPAL TUNNEL SYNDROME, LEFT 05/21/2008  . HLD (hyperlipidemia) 04/12/2008  . ANEMIA-NOS 04/12/2008  . Allergic rhinitis 04/12/2008  . Anxiety state 10/31/2007  . Depression 10/31/2007  . FIBROMYALGIA 10/31/2007    Glenetta Hew, SPT 12/25/2020, 5:39 PM  Scot Jun, PT, DPT, OCS, ATC 12/26/20  8:13 AM     [facility] [address] [place], [place], [address] Phone: [phone]   Fax:  [phone]  Name: Christina Waller MRN: [id number] Date of Birth: 07-Sep-1956

## 2020-12-26 NOTE — Progress Notes (Signed)
   12/26/20 0001  Exercises  Other Exercises  HEP instruction/performance c cues for techniques c handout provided.  Trial set of each exercise per Pt. instruction handout for proper technique review.

## 2021-01-09 DIAGNOSIS — L570 Actinic keratosis: Secondary | ICD-10-CM | POA: Diagnosis not present

## 2021-01-09 DIAGNOSIS — C44712 Basal cell carcinoma of skin of right lower limb, including hip: Secondary | ICD-10-CM | POA: Diagnosis not present

## 2021-01-09 DIAGNOSIS — D485 Neoplasm of uncertain behavior of skin: Secondary | ICD-10-CM | POA: Diagnosis not present

## 2021-01-09 DIAGNOSIS — L820 Inflamed seborrheic keratosis: Secondary | ICD-10-CM | POA: Diagnosis not present

## 2021-01-09 DIAGNOSIS — D1801 Hemangioma of skin and subcutaneous tissue: Secondary | ICD-10-CM | POA: Diagnosis not present

## 2021-01-09 DIAGNOSIS — L821 Other seborrheic keratosis: Secondary | ICD-10-CM | POA: Diagnosis not present

## 2021-01-09 DIAGNOSIS — L578 Other skin changes due to chronic exposure to nonionizing radiation: Secondary | ICD-10-CM | POA: Diagnosis not present

## 2021-01-09 DIAGNOSIS — L814 Other melanin hyperpigmentation: Secondary | ICD-10-CM | POA: Diagnosis not present

## 2021-01-14 ENCOUNTER — Ambulatory Visit: Payer: PPO | Admitting: Orthopaedic Surgery

## 2021-01-14 ENCOUNTER — Encounter: Payer: Self-pay | Admitting: Orthopaedic Surgery

## 2021-01-14 DIAGNOSIS — M7062 Trochanteric bursitis, left hip: Secondary | ICD-10-CM

## 2021-01-14 NOTE — Progress Notes (Signed)
HPI: Christina Waller returns today follow-up of her left hip.  She been to one visit with physical therapy.  She also had a cortisone injection left hip.  She states she is doing better.  She did some radicular symptoms into the left thigh but this current currently.  She has some low back pain.  Denies any radicular symptoms otherwise.  Physical exam: Bilateral hips excellent range of motion without pain.  She has tenderness over the trochanteric region of both hips left greater than right.  Impression: Left trochanteric bursitis Low back pain without radicular symptoms  Plan: She will continue physical therapy for IT band stretching.  We will add to her therapy for them to work on a home exercise program for her back.  Questions encouraged and answered.  She will follow-up with [place] as needed symptoms persist or becomes worse.

## 2021-01-14 NOTE — Addendum Note (Signed)
Addended by: Robyne Peers on: 01/14/2021 05:12 PM   Modules accepted: Orders

## 2021-01-15 ENCOUNTER — Ambulatory Visit: Payer: PPO | Admitting: Rehabilitative and Restorative Service Providers"

## 2021-01-15 ENCOUNTER — Other Ambulatory Visit: Payer: Self-pay

## 2021-01-15 ENCOUNTER — Encounter: Payer: Self-pay | Admitting: Rehabilitative and Restorative Service Providers"

## 2021-01-15 DIAGNOSIS — M545 Low back pain, unspecified: Secondary | ICD-10-CM

## 2021-01-15 DIAGNOSIS — M25552 Pain in left hip: Secondary | ICD-10-CM

## 2021-01-15 DIAGNOSIS — R262 Difficulty in walking, not elsewhere classified: Secondary | ICD-10-CM

## 2021-01-15 DIAGNOSIS — G8929 Other chronic pain: Secondary | ICD-10-CM

## 2021-01-15 DIAGNOSIS — M6281 Muscle weakness (generalized): Secondary | ICD-10-CM | POA: Diagnosis not present

## 2021-01-15 NOTE — Therapy (Signed)
[facility], [place], [address] Phone: [phone]   Fax:  [phone]  Physical Therapy Treatment  Patient Details  Name: Christina Waller MRN: [id number] Date of Birth: 08-Mar-1956 Referring Provider (PT): Dr. Ninfa Linden   Encounter Date: 01/15/2021   PT End of Session - 01/15/21 1611    Visit Number 2    Number of Visits 12    Date for PT Re-Evaluation 02/20/21    Progress Note Due on Visit 10    PT Start Time 4315    PT Stop Time 4008    PT Time Calculation (min) 38 min    Activity Tolerance Patient tolerated treatment well;No increased pain    Behavior During Therapy WFL for tasks assessed/performed           Past Medical History:  Diagnosis Date  . Allergic rhinitis   . Anemia    NOS  . Anxiety   . Bronchiectasis ([place]) 01/05/2019  . Depression   . Fibromyalgia   . Headache(784.0)   . HNP (herniated nucleus pulposus), lumbar    L 2-3  . Hyperlipidemia   . Migraines   . Osteopenia   . Restless leg syndrome   . STD (sexually transmitted disease)    HSV that outbreaks mid back  . Unspecified vitamin D deficiency     Past Surgical History:  Procedure Laterality Date  . AUGMENTATION MAMMAPLASTY Bilateral 5/01   saline  . BREAST SURGERY    . COLONOSCOPY  11/05   normal recheck in 8 years  . TUBAL LIGATION     bilat    There were no vitals filed for this visit.   Subjective Assessment - 01/15/21 1607    Subjective Pt. stated feeling less complaints from lateral hip, deferring DN today. Has more complaints from back than hip at this time.    Pertinent History L greater trochanter Hip injection 11/05/2020    Limitations Walking;Standing;Sitting    How long can you walk comfortably? 10 minutes without a break    Diagnostic tests Xray imaging 11/05/2020 -""AP pelvis and lateral view of the left hip: No acute fractures.  Bilateral hips well located.  Both hips are well maintained with no significant  arthritic changes"    Patient Stated Goals get back to doing more yoga, hiking, walking without pain, and reduce ache while working    Currently in Pain? Yes    Pain Score 6     Pain Location Back    Pain Orientation Mid;Lower    Pain Descriptors / Indicators Aching    Pain Type Chronic pain    Pain Onset More than a month ago    Aggravating Factors  work related bending over                             [facility] Adult PT Treatment/Exercise - 01/15/21 0001      Exercises   Other Exercises  HEP review, handout provided again.  Additional time spent c cues      Knee/Hip Exercises: Stretches   Other Knee/Hip Stretches supine piriformis opposite shoulder stretch 30 sec x 3 bilateral, figure 4 piriformis 30 sec x 3 bilateral    Other Knee/Hip Stretches supine lumbar trunk rotation stretch 15 sec x 3 bilateral, lumbar extension x 10      Knee/Hip Exercises: Aerobic   Nustep Nustep lvl 6 10 mins      Knee/Hip Exercises: Standing  Other Standing Knee Exercises tband rows blue 3 x 10      Knee/Hip Exercises: Supine   Bridges 10 reps;Both    Single Leg Bridge 10 reps;Both                  PT Education - 01/15/21 1611    Education Details Additional HEP printout    Person(s) Educated Patient    Methods Explanation;Demonstration;Verbal cues;Handout    Comprehension Returned demonstration;Verbalized understanding            PT Short Term Goals - 01/15/21 1611      PT SHORT TERM GOAL #1   Title Patient will show early compliance and understanding  for HEP    Baseline HEP provided and demonstrated 12/25/20    Time 2    Period Weeks    Status On-going    Target Date 01/08/21             PT Long Term Goals - 12/25/20 1732      PT LONG TERM GOAL #1   Title Patient will increase Left hip abduction and extension strength to 4+/5 for increased function    Baseline patient presents with L hip abd-3/5, and L hip ext-4/5    Time 8    Period Weeks     Status New    Target Date 02/19/21      PT LONG TERM GOAL #2   Title Patient will be able to walk 30 minutes with pain under  2/10    Baseline walking becomes painful at 10 minutes    Time 8    Period Weeks    Status New    Target Date 02/20/21      PT LONG TERM GOAL #3   Title Patient will demonstrate/report pain at worst less than or equal to 2/10 to facilitate minimal limitation in daily activity secondary to pain symptoms.    Time 8    Period Weeks    Status New    Target Date 02/20/21      PT LONG TERM GOAL #4   Title Pt. will demonstrate bilateral SLS 30 seconds s deviation to facilitate stability in standing/walking activity.    Time 8    Period Weeks    Status New    Target Date 02/20/21      PT LONG TERM GOAL #5   Title FOTO outcome 56% or greater to indicated reduced disability due to symptoms.    Time 8    Period Weeks    Status New    Target Date 02/20/21                 Plan - 01/15/21 1632    Clinical Impression Statement Review of HEP required to improve techniques overall.  Handout provided again.  Hip complaints seem to be lessened overall in severity.  Continued strengthening beneficial at this time.  Lumbar mobility intervention and posterior chain strengthening/activition helpful to counterbalance flexion based work environment.    Examination-Activity Limitations Stand;Locomotion Level    Examination-Participation Restrictions Community Activity;Occupation    Stability/Clinical Decision Making Stable/Uncomplicated    Rehab Potential Excellent    PT Frequency 2x / week   1-2x/week   PT Duration 8 weeks    PT Treatment/Interventions ADLs/Self Care Home Management;Cryotherapy;Electrical Stimulation;Iontophoresis 4mg /ml Dexamethasone;Moist Heat;Balance training;Therapeutic exercise;Therapeutic activities;Functional mobility training;Stair training;Gait training;Ultrasound;Neuromuscular re-education;DME Instruction;Patient/family education;Manual  techniques;Taping;Spinal Manipulations;Joint Manipulations;Dry needling;Passive range of motion    PT Next Visit Plan DN prn for hip, continue to  improve lumbar, hip mobility symptoms.    PT Home Exercise Plan [facility]    Consulted and Agree with Plan of Care Patient           Patient will benefit from skilled therapeutic intervention in order to improve the following deficits and impairments:  Difficulty walking,Decreased activity tolerance,Decreased balance,Decreased mobility,Decreased strength,Pain,Decreased endurance,Decreased coordination,Impaired perceived functional ability,Increased fascial restricitons  Visit Diagnosis: Pain in left hip  Chronic midline low back pain, unspecified whether sciatica present  Muscle weakness (generalized)  Difficulty in walking, not elsewhere classified     Problem List Patient Active Problem List   Diagnosis Date Noted  . Viral illness 07/30/2019  . Bronchiectasis ([place]) 01/05/2019  . Chronic cough 09/16/2017  . Acute upper respiratory infection 03/30/2017  . Migraine 03/30/2017  . Cough 11/09/2016  . Bunion, left foot 10/23/2015  . Skin lesion 10/23/2015  . Lower back pain 09/15/2014  . Right hip pain 09/14/2013  . Preventative health care 06/29/2011  . HEMORRHOIDS-EXTERNAL 01/09/2010  . [place] NONINFECTIOUS GASTROENTERITIS&COLITIS 12/02/2009  . OSTEOPENIA 02/14/2009  . FATIGUE 08/16/2008  . DYSPNEA 08/16/2008  . CARPAL TUNNEL SYNDROME, LEFT 05/21/2008  . HLD (hyperlipidemia) 04/12/2008  . ANEMIA-NOS 04/12/2008  . Allergic rhinitis 04/12/2008  . Anxiety state 10/31/2007  . Depression 10/31/2007  . FIBROMYALGIA 10/31/2007    Scot Jun, PT, DPT, OCS, ATC 01/15/21  4:36 PM    [facility] [address] [place], [place], [address] Phone: [phone]   Fax:  [phone]  Name: Christina Waller MRN: [id number] Date of Birth: 1955-12-18

## 2021-01-20 ENCOUNTER — Encounter: Payer: PPO | Admitting: Rehabilitative and Restorative Service Providers"

## 2021-01-27 ENCOUNTER — Ambulatory Visit (INDEPENDENT_AMBULATORY_CARE_PROVIDER_SITE_OTHER): Payer: PPO | Admitting: Rehabilitative and Restorative Service Providers"

## 2021-01-27 ENCOUNTER — Other Ambulatory Visit: Payer: Self-pay

## 2021-01-27 ENCOUNTER — Encounter: Payer: Self-pay | Admitting: Rehabilitative and Restorative Service Providers"

## 2021-01-27 DIAGNOSIS — R262 Difficulty in walking, not elsewhere classified: Secondary | ICD-10-CM | POA: Diagnosis not present

## 2021-01-27 DIAGNOSIS — M545 Low back pain, unspecified: Secondary | ICD-10-CM | POA: Diagnosis not present

## 2021-01-27 DIAGNOSIS — M25552 Pain in left hip: Secondary | ICD-10-CM | POA: Diagnosis not present

## 2021-01-27 DIAGNOSIS — M6281 Muscle weakness (generalized): Secondary | ICD-10-CM

## 2021-01-27 DIAGNOSIS — G8929 Other chronic pain: Secondary | ICD-10-CM

## 2021-01-27 NOTE — Therapy (Addendum)
[place], [place], [address] Phone: [phone]   Fax:  [phone]  Physical Therapy Treatment/Discharge  Patient Details  Name: Christina Waller MRN: [id number] Date of Birth: [birth year] Referring Provider (PT): Dr. Ninfa Linden   Encounter Date: 01/27/2021   PT End of Session - 01/27/21 1626    Visit Number 3    Number of Visits 12    Date for PT Re-Evaluation 02/20/21    Progress Note Due on Visit 10    PT Start Time 6979    PT Stop Time 4801    PT Time Calculation (min) 28 min    Activity Tolerance Patient tolerated treatment well;No increased pain    Behavior During Therapy WFL for tasks assessed/performed           Past Medical History:  Diagnosis Date  . Allergic rhinitis   . Anemia    NOS  . Anxiety   . Bronchiectasis ([place]) 01/05/2019  . Depression   . Fibromyalgia   . Headache(784.0)   . HNP (herniated nucleus pulposus), lumbar    L 2-3  . Hyperlipidemia   . Migraines   . Osteopenia   . Restless leg syndrome   . STD (sexually transmitted disease)    HSV that outbreaks mid back  . Unspecified vitamin D deficiency     Past Surgical History:  Procedure Laterality Date  . AUGMENTATION MAMMAPLASTY Bilateral 5/01   saline  . BREAST SURGERY    . COLONOSCOPY  11/05   normal recheck in 8 years  . TUBAL LIGATION     bilat    There were no vitals filed for this visit.   Subjective Assessment - 01/27/21 1620    Subjective Pt. indicated hip doing better. Pt. stated low ache 2/10 or so in back today.  Did yoga yesterday and reported that may be some of it.  Feeling hopeful.  GROC reported at +4 moderately better.    Pertinent History L greater trochanter Hip injection 11/05/2020    Limitations Walking;Standing;Sitting    How long can you walk comfortably? 10 minutes without a break    Diagnostic tests Xray imaging 11/05/2020 -""AP pelvis and lateral view of the left hip: No acute fractures.  Bilateral  hips well located.  Both hips are well maintained with no significant arthritic changes"    Patient Stated Goals get back to doing more yoga, hiking, walking without pain, and reduce ache while working    Currently in Pain? Yes    Pain Score 2     Pain Location Back    Pain Orientation Lower;Mid    Pain Descriptors / Indicators Aching    Pain Type Chronic pain    Pain Onset More than a month ago    Aggravating Factors  yoga class (first time in a while)    Pain Relieving Factors HEP, stretching              [facility] PT Assessment - 01/27/21 0001      Assessment   Medical Diagnosis Lt hip pain, bursitis    Referring Provider (PT) Dr. Ninfa Linden    Onset Date/Surgical Date 06/24/20    Hand Dominance Left      Observation/Other Assessments   Focus on Therapeutic Outcomes (FOTO)  63% update      Single Leg Stance   Comments Lt SLS 20 seconds  [facility] Adult PT Treatment/Exercise - 01/27/21 0001      Exercises   Other Exercises  HEP review, progression c wellness/[employer] based activity.      Knee/Hip Exercises: Aerobic   Nustep Lvl 6 10 mins      Knee/Hip Exercises: Machines for Strengthening   Total Gym Leg Press 2 x 10 SL 37 lbs, performed bilateral      Knee/Hip Exercises: Standing   Other Standing Knee Exercises SLS to loss of blanace, 20 sec x 2 on Lt, 20 sec on Rt x 1                  PT Education - 01/27/21 1635    Education Details HEP review, [employer] based exercise progression.    Person(s) Educated Patient    Methods Explanation;Demonstration;Verbal cues    Comprehension Verbalized understanding;Returned demonstration            PT Short Term Goals - 01/15/21 1611      PT SHORT TERM GOAL #1   Title Patient will show early compliance and understanding  for HEP    Baseline HEP provided and demonstrated 12/25/20    Time 2    Period Weeks    Status On-going    Target Date 01/08/21             PT Long Term Goals -  01/27/21 1634      PT LONG TERM GOAL #1   Title Patient will increase Left hip abduction and extension strength to 4+/5 for increased function    Baseline patient presents with L hip abd-3/5, and L hip ext-4/5    Time 8    Period Weeks    Status On-going      PT LONG TERM GOAL #2   Title Patient will be able to walk 30 minutes with pain under  2/10    Time 8    Period Weeks    Status On-going      PT LONG TERM GOAL #3   Title Patient will demonstrate/report pain at worst less than or equal to 2/10 to facilitate minimal limitation in daily activity secondary to pain symptoms.    Time 8    Period Weeks    Status On-going      PT LONG TERM GOAL #4   Title Pt. will demonstrate bilateral SLS 30 seconds s deviation to facilitate stability in standing/walking activity.    Time 8    Period Weeks    Status On-going      PT LONG TERM GOAL #5   Title FOTO outcome 56% or greater to indicated reduced disability due to symptoms.    Time 8    Period Weeks    Status Achieved                 Plan - 01/27/21 1629    Clinical Impression Statement Pt. has attended 3 visits, reporting GROC +4 at this time.  Noted improvement in FOTO assessment outcome.  Pt. may continue to benefit from intervention and HEP to improve mobility for self symptom management, progress endurance and strength of LE for full return to PLOF and establishment of routine wellness program.    Examination-Activity Limitations Stand;Locomotion Level    Examination-Participation Restrictions Community Activity;Occupation    Stability/Clinical Decision Making Stable/Uncomplicated    Rehab Potential Excellent    PT Frequency 2x / week   1-2x/week   PT Duration 8 weeks    PT Treatment/Interventions ADLs/Self Care Home Management;Cryotherapy;Dealer  Stimulation;Iontophoresis 22m/ml Dexamethasone;Moist Heat;Balance training;Therapeutic exercise;Therapeutic activities;Functional mobility training;Stair training;Gait  training;Ultrasound;Neuromuscular re-education;DME Instruction;Patient/family education;Manual techniques;Taping;Spinal Manipulations;Joint Manipulations;Dry needling;Passive range of motion    PT Next Visit Plan DN prn for hip, continue to improve lumbar, hip mobility symptoms.  Progression towards HEP as appropriate.    PT [facility]   Consulted and Agree with Plan of Care Patient           Patient will benefit from skilled therapeutic intervention in order to improve the following deficits and impairments:  Difficulty walking,Decreased activity tolerance,Decreased balance,Decreased mobility,Decreased strength,Pain,Decreased endurance,Decreased coordination,Impaired perceived functional ability,Increased fascial restricitons  Visit Diagnosis: Pain in left hip  Chronic midline low back pain, unspecified whether sciatica present  Muscle weakness (generalized)  Difficulty in walking, not elsewhere classified     Problem List Patient Active Problem List   Diagnosis Date Noted  . Viral illness 07/30/2019  . [place] 01/05/2019  . Chronic cough 09/16/2017  . Acute upper respiratory infection 03/30/2017  . Migraine 03/30/2017  . Cough 11/09/2016  . Bunion, left foot 10/23/2015  . Skin lesion 10/23/2015  . Lower back pain 09/15/2014  . Right hip pain 09/14/2013  . Preventative health care 06/29/2011  . HEMORRHOIDS-EXTERNAL 01/09/2010  . [place]ECT[place]OUS GA[place]ENTERITIS&COLITIS 12/02/2009  . OSTEOPENIA 02/14/2009  . FATIGUE 08/16/2008  . DYSPNEA 08/16/2008  . CARPAL TUNNEL SYNDROME, LEFT 05/21/2008  . HLD (hyperlipidemia) 04/12/2008  . ANEMIA-NOS 04/12/2008  . Allergic rhinitis 04/12/2008  . Anxiety state 10/31/2007  . Depression 10/31/2007  . FIBROMYALGIA 10/31/2007    MScot Jun PT, DPT, OCS, ATC 01/27/21  4:37 PM  PHYSICAL THERAPY DISCHARGE SUMMARY  Visits from Start of Care: 3  Current functional level related to goals  / functional outcomes: See note   Remaining deficits: See note   Education / Equipment: HEP Plan: Patient agrees to discharge.  Patient goals were partially met. Patient is being discharged due to being pleased with the current functional level.  ?????    MScot Jun PT, DPT, OCS, ATC 03/12/21  1:13 PM     [facility] [address] [address]Phone: [phone]  Fax:  [phone] Name: Christina KOCHANOWSKIMRN: [id number] of Birth: 8September 17, 1957

## 2021-01-29 ENCOUNTER — Other Ambulatory Visit: Payer: Self-pay | Admitting: Internal Medicine

## 2021-02-02 ENCOUNTER — Other Ambulatory Visit: Payer: Self-pay | Admitting: Internal Medicine

## 2021-02-02 NOTE — Telephone Encounter (Signed)
No due to office refill policy  LOV was jan 0379

## 2021-02-03 ENCOUNTER — Encounter: Payer: PPO | Admitting: Rehabilitative and Restorative Service Providers"

## 2021-02-05 ENCOUNTER — Other Ambulatory Visit: Payer: Self-pay | Admitting: Internal Medicine

## 2021-02-06 ENCOUNTER — Other Ambulatory Visit: Payer: Self-pay

## 2021-02-06 ENCOUNTER — Encounter: Payer: Self-pay | Admitting: Internal Medicine

## 2021-02-06 ENCOUNTER — Ambulatory Visit (INDEPENDENT_AMBULATORY_CARE_PROVIDER_SITE_OTHER): Payer: PPO | Admitting: Internal Medicine

## 2021-02-06 VITALS — BP 126/78 | HR 84 | Ht 69.0 in | Wt 141.0 lb

## 2021-02-06 DIAGNOSIS — Z1211 Encounter for screening for malignant neoplasm of colon: Secondary | ICD-10-CM | POA: Diagnosis not present

## 2021-02-06 DIAGNOSIS — M8580 Other specified disorders of bone density and structure, unspecified site: Secondary | ICD-10-CM | POA: Diagnosis not present

## 2021-02-06 DIAGNOSIS — E559 Vitamin D deficiency, unspecified: Secondary | ICD-10-CM

## 2021-02-06 DIAGNOSIS — Z0000 Encounter for general adult medical examination without abnormal findings: Secondary | ICD-10-CM

## 2021-02-06 DIAGNOSIS — E7800 Pure hypercholesterolemia, unspecified: Secondary | ICD-10-CM

## 2021-02-06 DIAGNOSIS — M19049 Primary osteoarthritis, unspecified hand: Secondary | ICD-10-CM

## 2021-02-06 DIAGNOSIS — I700 Atherosclerosis of aorta: Secondary | ICD-10-CM

## 2021-02-06 DIAGNOSIS — Z23 Encounter for immunization: Secondary | ICD-10-CM | POA: Diagnosis not present

## 2021-02-06 DIAGNOSIS — E538 Deficiency of other specified B group vitamins: Secondary | ICD-10-CM

## 2021-02-06 DIAGNOSIS — M5441 Lumbago with sciatica, right side: Secondary | ICD-10-CM

## 2021-02-06 DIAGNOSIS — G8929 Other chronic pain: Secondary | ICD-10-CM

## 2021-02-06 LAB — BASIC METABOLIC PANEL
BUN: 20 mg/dL (ref 6–23)
CO2: 29 mEq/L (ref 19–32)
Calcium: 9.3 mg/dL (ref 8.4–10.5)
Chloride: 105 mEq/L (ref 96–112)
Creatinine, Ser: 0.94 mg/dL (ref 0.40–1.20)
GFR: 63.69 mL/min (ref >60.00)
Glucose, Bld: 83 mg/dL (ref 70–99)
Potassium: 5.2 mEq/L — ABNORMAL HIGH (ref 3.5–5.1)
Sodium: 140 mEq/L (ref 135–145)

## 2021-02-06 LAB — URINALYSIS, ROUTINE W REFLEX MICROSCOPIC
Bilirubin Urine: NEGATIVE
Hgb urine dipstick: NEGATIVE
Leukocytes,Ua: NEGATIVE
Nitrite: NEGATIVE
Specific Gravity, Urine: 1.025 (ref 1.000–1.030)
Total Protein, Urine: NEGATIVE
Urine Glucose: NEGATIVE
Urobilinogen, UA: 0.2 (ref 0.0–1.0)
pH: 6 (ref 5.0–8.0)

## 2021-02-06 LAB — HEPATIC FUNCTION PANEL
ALT: 17 U/L (ref 0–35)
AST: 22 U/L (ref 0–37)
Albumin: 4 g/dL (ref 3.5–5.2)
Alkaline Phosphatase: 58 U/L (ref 39–117)
Bilirubin, Direct: 0.1 mg/dL (ref 0.0–0.3)
Total Bilirubin: 0.6 mg/dL (ref 0.2–1.2)
Total Protein: 6.4 g/dL (ref 6.0–8.3)

## 2021-02-06 LAB — CBC WITH DIFFERENTIAL/PLATELET
Basophils Absolute: 0.1 10*3/uL (ref 0.0–0.1)
Basophils Relative: 1 % (ref 0.0–3.0)
Eosinophils Absolute: 0.5 10*3/uL (ref 0.0–0.7)
Eosinophils Relative: 8.8 % — ABNORMAL HIGH (ref 0.0–5.0)
HCT: 35.9 % — ABNORMAL LOW (ref 36.0–46.0)
Hemoglobin: 12 g/dL (ref 12.0–15.0)
Lymphocytes Relative: 37.1 % (ref 12.0–46.0)
Lymphs Abs: 2.1 10*3/uL (ref 0.7–4.0)
MCHC: 33.4 g/dL (ref 30.0–36.0)
MCV: 96.8 fl (ref 78.0–100.0)
Monocytes Absolute: 0.6 10*3/uL (ref 0.1–1.0)
Monocytes Relative: 10.2 % (ref 3.0–12.0)
Neutro Abs: 2.5 10*3/uL (ref 1.4–7.7)
Neutrophils Relative %: 42.9 % — ABNORMAL LOW (ref 43.0–77.0)
Platelets: 249 10*3/uL (ref 150.0–400.0)
RBC: 3.71 Mil/uL — ABNORMAL LOW (ref 3.87–5.11)
RDW: 13.4 % (ref 11.5–15.5)
WBC: 5.8 10*3/uL (ref 4.0–10.5)

## 2021-02-06 LAB — LIPID PANEL
Cholesterol: 222 mg/dL — ABNORMAL HIGH (ref 0–200)
HDL: 75.4 mg/dL (ref >39.00)
LDL Cholesterol: 131 mg/dL — ABNORMAL HIGH (ref 0–99)
NonHDL: 146.94
Total CHOL/HDL Ratio: 3
Triglycerides: 79 mg/dL (ref 0.0–149.0)
VLDL: 15.8 mg/dL (ref 0.0–40.0)

## 2021-02-06 LAB — VITAMIN D 25 HYDROXY (VIT D DEFICIENCY, FRACTURES): VITD: 36.78 ng/mL (ref 30.00–100.00)

## 2021-02-06 LAB — VITAMIN B12: Vitamin B-12: 788 pg/mL (ref 211–911)

## 2021-02-06 LAB — TSH: TSH: 2.11 u[IU]/mL (ref 0.35–4.50)

## 2021-02-06 MED ORDER — CELECOXIB 200 MG PO CAPS: 200.0000 mg | ORAL_CAPSULE | Freq: Two times a day (BID) | ORAL | 3 refills | Status: DC | PRN

## 2021-02-06 NOTE — Assessment & Plan Note (Signed)
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

## 2021-02-06 NOTE — Patient Instructions (Addendum)
You had the Prevnar 13 pneumonia shot today  You will be contacted regarding the referral for: colonoscopy  Please schedule the bone density test before leaving today at the scheduling desk (where you check out) - this is done at the xray dept of the [facility] site  Please take all new medication as prescribed - the celebrex as needed for pain  Ok also to use the OTC Voltaren gel as needed for joint arthritis pain  Please continue all other medications as before, and refills have been done if requested.  Please have the pharmacy call with any other refills you may need.  Please continue your efforts at being more active, low cholesterol diet, and weight control.  You are otherwise up to date with prevention measures today.  Please keep your appointments with your specialists as you may have planned  Please go to the LAB at the blood drawing area for the tests to be done  You will be contacted by phone if any changes need to be made immediately.  Otherwise, you will receive a letter about your results with an explanation, but please check with MyChart first.  Please remember to sign up for MyChart if you have not done so, as this will be important to you in the future with finding out test results, communicating by private email, and scheduling acute appointments online when needed.  Please make an Appointment to return for your 1 year visit, or sooner if needed

## 2021-02-06 NOTE — Progress Notes (Signed)
Patient ID: Christina Waller, female   DOB: 1956-10-21, 65 y.o.   MRN: [id number]         Chief Complaint:: wellness exam and osteopenia, HLD       HPI:  Christina Waller is a 65 y.o. female here for wellness exam; had full body check at derm, had the mammogram, to have basal cell to right post thigh to be off in mar 2022; due for colonoscopy; due for prevnar and DXA for osteopenia.                        Also s/p left wrist fx after pulled over by dog, seen per ortho. Now back to work as Arboriculturist and back to gym after off for 3 months. Pt continues to have recurring LBP without change in severity, bowel or bladder change, fever, wt loss,  worsening LE pain/numbness/weakness, gait change or falls.  Also has ongoing pain from hand arthritis. Mild stiffness worse in the am. Trying to follow a lower chol diet.  Pt denies chest pain, increased sob or doe, wheezing, orthopnea, PND, increased LE swelling, palpitations, dizziness or syncope.   Pt denies polydipsia, polyuria, Denes new focal neuro s/s.     Wt Readings from Last 3 Encounters:  02/06/21 141 lb (64 kg)  11/05/20 144 lb (65.3 kg)  08/22/20 140 lb 6.4 oz (63.7 kg)   BP Readings from Last 3 Encounters:  02/06/21 126/78  08/22/20 120/70  03/11/20 121/80   Immunization History  Administered Date(s) Administered  . Fluad Quad(high Dose 65+) 10/18/2020  . Influenza Inj Mdck Quad Pf 11/15/2018  . Influenza Split 09/03/2011  . Influenza,inj,Quad PF,6+ Mos 09/14/2013, 09/13/2014, 10/23/2015, 11/09/2016, 09/16/2017, 11/22/2019  . PFIZER(Purple Top)SARS-COV-2 Vaccination 12/28/2019, 01/18/2020, 10/09/2020  . PPD Test 09/04/2012  . Pneumococcal Conjugate-13 02/06/2021  . Td 03/16/2006  . Tdap 11/09/2016   Health Maintenance Due  Topic Date Due  . COLONOSCOPY (Pts 45-71yrs Insurance coverage will need to be confirmed)  10/26/2014      Past Medical History:  Diagnosis Date  . Allergic rhinitis   . Anemia    NOS  . Anxiety   .  Bronchiectasis ([place]) 01/05/2019  . Depression   . Fibromyalgia   . Headache(784.0)   . HNP (herniated nucleus pulposus), lumbar    L 2-3  . Hyperlipidemia   . Migraines   . Osteopenia   . Restless leg syndrome   . STD (sexually transmitted disease)    HSV that outbreaks mid back  . Unspecified vitamin D deficiency    Past Surgical History:  Procedure Laterality Date  . AUGMENTATION MAMMAPLASTY Bilateral 5/01   saline  . BREAST SURGERY    . COLONOSCOPY  11/05   normal recheck in 8 years  . TUBAL LIGATION     bilat    reports that she has never smoked. She has never used smokeless tobacco. She reports current alcohol use of about 5.0 standard drinks of alcohol per week. She reports that she does not use drugs. family history includes Depression in her mother and sister; Heart disease in her father and maternal grandfather; Hypertension in her maternal grandmother; Osteoarthritis in her mother; Osteoporosis in her maternal grandmother; Ovarian cancer (age of onset: 46) in her mother; Ulcers in her maternal grandmother. Allergies  Allergen Reactions  . Clindamycin/Lincomycin Other (See Comments)    C-Diff   Current Outpatient Medications on File Prior to Visit  Medication Sig Dispense Refill  .  Cetirizine HCl (ZYRTEC ALLERGY) 10 MG CAPS Zyrtec    . clonazePAM (KLONOPIN) 0.5 MG tablet TAKE 1 TABLET BY MOUTH TWICE A DAY AS NEEDED 60 tablet 5  . cyclobenzaprine (FLEXERIL) 10 MG tablet Take 1 tablet (10 mg total) by mouth at bedtime. Keep scheduled appt for future refills 30 tablet 0  . DULoxetine (CYMBALTA) 60 MG capsule Take 1 capsule (60 mg total) by mouth 2 (two) times daily. Keep scheduled appt for future refills 60 capsule 0  . ibuprofen (ADVIL) 800 MG tablet Take 800 mg by mouth 3 (three) times daily.    . montelukast (SINGULAIR) 10 MG tablet Take 1 tablet (10 mg total) by mouth at bedtime. 90 tablet 3  . rosuvastatin (CRESTOR) 40 MG tablet Take 1 tablet (40 mg total) by mouth  daily. 90 tablet 3  . SUMAtriptan (IMITREX) 100 MG tablet PLEASE SEE ATTACHED FOR DETAILED DIRECTIONS 10 tablet 11  . traMADol (ULTRAM) 50 MG tablet TAKE 1 TABLET (50 MG TOTAL) BY MOUTH DAILY AS NEEDED. FOR PAIN 30 tablet 2  . valACYclovir (VALTREX) 1000 MG tablet TAKE 1 TABLET BY MOUTH TWICE A DAY 30 tablet 2  . FLUAD QUADRIVALENT 0.5 ML injection      No current facility-administered medications on file prior to visit.        ROS:  All others reviewed and negative.  Objective        PE:  BP 126/78   Pulse 84   Ht 5\' 9"  (1.753 m)   Wt 141 lb (64 kg)   LMP 07/13/2006 (Approximate)   SpO2 97%   BMI 20.82 kg/m                 Constitutional: Pt appears in NAD               HENT: Head: NCAT.                Right Ear: External ear normal.                 Left Ear: External ear normal.                Eyes: . Pupils are equal, round, and reactive to light. Conjunctivae and EOM are normal               Nose: without d/c or deformity               Neck: Neck supple. Gross normal ROM               Cardiovascular: Normal rate and regular rhythm.                 Pulmonary/Chest: Effort normal and breath sounds without rales or wheezing.                Abd:  Soft, NT, ND, + BS, no organomegaly               Neurological: Pt is alert. At baseline orientation, motor grossly intact               Skin: Skin is warm. No rashes, no other new lesions, LE edema - none               Psychiatric: Pt behavior is normal without agitation   Micro: none  Cardiac tracings I have personally interpreted today:  none  Pertinent Radiological findings (summarize): none   Lab Results  Component Value Date  WBC 5.8 02/06/2021   HGB 12.0 02/06/2021   HCT 35.9 (L) 02/06/2021   PLT 249.0 02/06/2021   GLUCOSE 83 02/06/2021   CHOL 222 (H) 02/06/2021   TRIG 79.0 02/06/2021   HDL 75.40 02/06/2021   LDLDIRECT 133.2 09/14/2013   LDLCALC 131 (H) 02/06/2021   ALT 17 02/06/2021   AST 22 02/06/2021   NA 140  02/06/2021   K 5.2 No hemolysis seen (H) 02/06/2021   CL 105 02/06/2021   CREATININE 0.94 02/06/2021   BUN 20 02/06/2021   CO2 29 02/06/2021   TSH 2.11 02/06/2021   INR 1.0 04/21/2018   Assessment/Plan:  Christina Waller is a 65 y.o. White or Caucasian [1] female with  has a past medical history of Allergic rhinitis, Anemia, Anxiety, Bronchiectasis ([place]) (01/05/2019), Depression, Fibromyalgia, Headache(784.0), HNP (herniated nucleus pulposus), lumbar, Hyperlipidemia, Migraines, Osteopenia, Restless leg syndrome, STD (sexually transmitted disease), and Unspecified vitamin D deficiency.  HLD (hyperlipidemia) On new statin - for f/u lipids today  Lower back pain Chronic mild recurrent left lower side - worse with working as dental hygenist - for celebrex bis prn, consider f/u with sport med  Preventative health care Age and sex appropriate education and counseling updated with regular exercise and diet Referrals for preventative services - for colonoscopy and DXA Immunizations addressed - for prevnar 13 today Smoking counseling  - none needed Evidence for depression or other mood disorder - none significant Most recent labs reviewed. I have personally reviewed and have noted: 1) the patient's medical and social history 2) The patient's current medications and supplements 3) The patient's height, weight, and BMI have been recorded in the chart   Aortic atherosclerosis (HCC) Stable, to continue crestor 40 and lower chol diet   Hand arthritis Ok for volt gel prn otc  Osteopenia Also due for DXA  Followup: Return in about 1 year (around 02/06/2022).  Cathlean Cower, MD 02/08/2021 3:47 AM [facility]

## 2021-02-06 NOTE — Assessment & Plan Note (Addendum)
On new statin - for f/u lipids today

## 2021-02-08 ENCOUNTER — Encounter: Payer: Self-pay | Admitting: Internal Medicine

## 2021-02-08 NOTE — Assessment & Plan Note (Signed)
Stable, to continue crestor 40 and lower chol diet

## 2021-02-08 NOTE — Assessment & Plan Note (Signed)
Ok for volt gel prn otc

## 2021-02-08 NOTE — Assessment & Plan Note (Signed)
Age and sex appropriate education and counseling updated with regular exercise and diet Referrals for preventative services - for colonoscopy and DXA Immunizations addressed - for prevnar 13 today Smoking counseling  - none needed Evidence for depression or other mood disorder - none significant Most recent labs reviewed. I have personally reviewed and have noted: 1) the patient's medical and social history 2) The patient's current medications and supplements 3) The patient's height, weight, and BMI have been recorded in the chart

## 2021-02-08 NOTE — Assessment & Plan Note (Signed)
Also due for DXA

## 2021-02-10 ENCOUNTER — Inpatient Hospital Stay: Admission: RE | Admit: 2021-02-10 | Payer: PPO | Source: Ambulatory Visit

## 2021-02-10 ENCOUNTER — Encounter: Payer: PPO | Admitting: Rehabilitative and Restorative Service Providers"

## 2021-02-13 ENCOUNTER — Other Ambulatory Visit: Payer: Self-pay | Admitting: Internal Medicine

## 2021-02-18 ENCOUNTER — Encounter: Payer: Self-pay | Admitting: Gastroenterology

## 2021-02-21 ENCOUNTER — Other Ambulatory Visit: Payer: Self-pay | Admitting: Internal Medicine

## 2021-03-04 DIAGNOSIS — C44712 Basal cell carcinoma of skin of right lower limb, including hip: Secondary | ICD-10-CM | POA: Diagnosis not present

## 2021-03-16 ENCOUNTER — Other Ambulatory Visit: Payer: Self-pay | Admitting: Internal Medicine

## 2021-03-23 ENCOUNTER — Other Ambulatory Visit: Payer: Self-pay

## 2021-03-23 ENCOUNTER — Ambulatory Visit (AMBULATORY_SURGERY_CENTER): Payer: Self-pay | Admitting: *Deleted

## 2021-03-23 VITALS — Ht 69.0 in | Wt 143.0 lb

## 2021-03-23 DIAGNOSIS — Z1211 Encounter for screening for malignant neoplasm of colon: Secondary | ICD-10-CM

## 2021-03-23 MED ORDER — PEG 3350-KCL-NA BICARB-NACL 420 G PO SOLR: 4000.0000 mL | Freq: Once | ORAL | 0 refills | Status: AC

## 2021-03-23 NOTE — Progress Notes (Signed)
Patient is here in-person for PV. Patient denies any allergies to eggs or soy. Patient denies any problems with anesthesia/sedation. Patient denies any oxygen use at home. Patient denies taking any diet/weight loss medications or blood thinners. Patient is not being treated for MRSA or C-diff. Patient is aware of our care-partner policy and Covid-19 safety protocol. EMMI education assigned to the patient for the procedure, sent to MyChart.   Patient is fully COVID-19 vaccinated, per patient.    

## 2021-04-01 ENCOUNTER — Encounter: Payer: Self-pay | Admitting: Gastroenterology

## 2021-04-03 ENCOUNTER — Other Ambulatory Visit: Payer: Self-pay

## 2021-04-03 ENCOUNTER — Encounter: Payer: Self-pay | Admitting: Gastroenterology

## 2021-04-03 ENCOUNTER — Ambulatory Visit (AMBULATORY_SURGERY_CENTER): Payer: PPO | Admitting: Gastroenterology

## 2021-04-03 VITALS — BP 137/87 | HR 78 | Temp 97.5°F | Resp 29 | Ht 69.0 in | Wt 143.0 lb

## 2021-04-03 DIAGNOSIS — D120 Benign neoplasm of cecum: Secondary | ICD-10-CM | POA: Diagnosis not present

## 2021-04-03 DIAGNOSIS — Z1211 Encounter for screening for malignant neoplasm of colon: Secondary | ICD-10-CM

## 2021-04-03 DIAGNOSIS — D122 Benign neoplasm of ascending colon: Secondary | ICD-10-CM | POA: Diagnosis not present

## 2021-04-03 MED ORDER — SODIUM CHLORIDE 0.9 % IV SOLN: 500.0000 mL | Freq: Once | INTRAVENOUS | Status: DC

## 2021-04-03 NOTE — Op Note (Signed)
[facility] Patient Name: Christina Waller Procedure Date: 04/03/2021 3:11 PM MRN: [id number] Endoscopist: Ladene Artist , MD Age: 65 Referring MD:  Date of Birth: June 06, 1956 Gender: Female Account #: [id number] Procedure:                Colonoscopy Indications:              Screening for colorectal malignant neoplasm Medicines:                Monitored Anesthesia Care Procedure:                Pre-Anesthesia Assessment:                           - Prior to the procedure, a History and Physical                            was performed, and patient medications and                            allergies were reviewed. The patient's tolerance of                            previous anesthesia was also reviewed. The risks                            and benefits of the procedure and the sedation                            options and risks were discussed with the patient.                            All questions were answered, and informed consent                            was obtained. Prior Anticoagulants: The patient has                            taken no previous anticoagulant or antiplatelet                            agents. ASA Grade Assessment: II - A patient with                            mild systemic disease. After reviewing the risks                            and benefits, the patient was deemed in                            satisfactory condition to undergo the procedure.                           After obtaining informed consent, the colonoscope  was passed under direct vision. Throughout the                            procedure, the patient's blood pressure, pulse, and                            oxygen saturations were monitored continuously. The                            Olympus PCF-H190DL [id number]) Colonoscope was                            introduced through the anus and advanced to the the                            cecum, identified  by appendiceal orifice and                            ileocecal valve. The ileocecal valve, appendiceal                            orifice, and rectum were photographed. The quality                            of the bowel preparation was good. The colonoscopy                            was somewhat difficult due to a redundant colon,                            significant looping and a tortuous colon.                            Successful completion of the procedure was aided by                            using manual pressure, withdrawing and reinserting                            the scope, straightening and shortening the scope                            to obtain bowel loop reduction and using scope                            torsion. The patient tolerated the procedure well. Scope In: 3:14:15 PM Scope Out: 3:44:48 PM Scope Withdrawal Time: 0 hours 19 minutes 12 seconds  Total Procedure Duration: 0 hours 30 minutes 33 seconds  Findings:                 The perianal and digital rectal examinations were                            normal.  A 20 mm polyp was found in the ileocecal valve. The                            polyp was sessile. The polyp was removed with a                            piecemeal technique using a hot snare. Resection                            and retrieval were complete.                           A 11 mm polyp was found in the proximal ascending                            colon. The polyp was sessile. The polyp was removed                            with a hot snare. Resection and retrieval were                            complete.                           A single medium-sized localized angiodysplastic                            lesion without bleeding was found in the mid                            ascending colon.                           Internal hemorrhoids were found during                            retroflexion. The hemorrhoids  were small and Grade                            I (internal hemorrhoids that do not prolapse).                           The exam was otherwise without abnormality on                            direct and retroflexion views. Complications:            No immediate complications. Estimated blood loss:                            None. Estimated Blood Loss:     Estimated blood loss: none. Impression:               - One 20 mm polyp at the ileocecal valve, removed  piecemeal using a hot snare. Resected and retrieved.                           - One 11 mm polyp in the proximal ascending colon,                            removed with a hot snare. Resected and retrieved.                           - Single angiodysplastic lesion in the ascending                            colon.                           - Small internal hemorrhoids.                           - The examination was otherwise normal on direct                            and retroflexion views. Recommendation:           - Repeat colonoscopy, likely in 3 years, for                            surveillance based on pathology results.                           - Patient has a contact number available for                            emergencies. The signs and symptoms of potential                            delayed complications were discussed with the                            patient. Return to normal activities tomorrow.                            Written discharge instructions were provided to the                            patient.                           - Resume previous diet.                           - Continue present medications.                           - Await pathology results.                           - No aspirin,  ibuprofen, naproxen, or other                            non-steroidal anti-inflammatory drugs for 2 weeks                            after polyp removal. Ladene Artist,  MD 04/03/2021 3:51:31 PM This report has been signed electronically.

## 2021-04-03 NOTE — Patient Instructions (Signed)
Discharge instructions given. Handouts on polyps and hemorrhoids. Resume previous medications. YOU HAD AN ENDOSCOPIC PROCEDURE TODAY AT THE [facility]:   Refer to the procedure report that was given to you for any specific questions about what was found during the examination.  If the procedure report does not answer your questions, please call your gastroenterologist to clarify.  If you requested that your care partner not be given the details of your procedure findings, then the procedure report has been included in a sealed envelope for you to review at your convenience later.  YOU SHOULD EXPECT: Some feelings of bloating in the abdomen. Passage of more gas than usual.  Walking can help get rid of the air that was put into your GI tract during the procedure and reduce the bloating. If you had a lower endoscopy (such as a colonoscopy or flexible sigmoidoscopy) you may notice spotting of blood in your stool or on the toilet paper. If you underwent a bowel prep for your procedure, you may not have a normal bowel movement for a few days.  Please Note:  You might notice some irritation and congestion in your nose or some drainage.  This is from the oxygen used during your procedure.  There is no need for concern and it should clear up in a day or so.  SYMPTOMS TO REPORT IMMEDIATELY:  Following lower endoscopy (colonoscopy or flexible sigmoidoscopy):  Excessive amounts of blood in the stool  Significant tenderness or worsening of abdominal pains  Swelling of the abdomen that is new, acute  Fever of 100F or higher   For urgent or emergent issues, a gastroenterologist can be reached at any hour by calling [phone]. Do not use MyChart messaging for urgent concerns.    DIET:  We do recommend a small meal at first, but then you may proceed to your regular diet.  Drink plenty of fluids but you should avoid alcoholic beverages for 24 hours.  ACTIVITY:  You should plan to take it  easy for the rest of today and you should NOT DRIVE or use heavy machinery until tomorrow (because of the sedation medicines used during the test).    FOLLOW UP: Our staff will call the number listed on your records 48-72 hours following your procedure to check on you and address any questions or concerns that you may have regarding the information given to you following your procedure. If we do not reach you, we will leave a message.  We will attempt to reach you two times.  During this call, we will ask if you have developed any symptoms of COVID 19. If you develop any symptoms (ie: fever, flu-like symptoms, shortness of breath, cough etc.) before then, please call [phone].  If you test positive for Covid 19 in the 2 weeks post procedure, please call and report this information to us.    If any biopsies were taken you will be contacted by phone or by letter within the next 1-3 weeks.  Please call us at [phone] if you have not heard about the biopsies in 3 weeks.    SIGNATURES/CONFIDENTIALITY: You and/or your care partner have signed paperwork which will be entered into your electronic medical record.  These signatures attest to the fact that that the information above on your After Visit Summary has been reviewed and is understood.  Full responsibility of the confidentiality of this discharge information lies with you and/or your care-partner.  

## 2021-04-03 NOTE — Progress Notes (Signed)
Called to room to assist during endoscopic procedure.  Patient ID and intended procedure confirmed with present staff. Received instructions for my participation in the procedure from the performing physician.  

## 2021-04-03 NOTE — Progress Notes (Signed)
To PACU, VSS. Report to [email or web address] 

## 2021-04-07 ENCOUNTER — Telehealth: Payer: Self-pay

## 2021-04-07 ENCOUNTER — Telehealth: Payer: Self-pay | Admitting: *Deleted

## 2021-04-07 ENCOUNTER — Other Ambulatory Visit: Payer: Self-pay | Admitting: Internal Medicine

## 2021-04-07 NOTE — Telephone Encounter (Signed)
Please refill as per office routine med refill policy (all routine meds refilled for 3 mo or monthly per pt preference up to one year from last visit, then month to month grace period for 3 mo, then further med refills will have to be denied)  

## 2021-04-07 NOTE — Telephone Encounter (Signed)
Attempted to reach patient for post-procedure f/u call. NO answer. Left message for her to please not hesitate to call us if she has questions/concerns regarding her care.

## 2021-04-07 NOTE — Telephone Encounter (Signed)
Follow up call made. 

## 2021-04-13 ENCOUNTER — Encounter: Payer: Self-pay | Admitting: Gastroenterology

## 2021-04-16 ENCOUNTER — Other Ambulatory Visit: Payer: Self-pay | Admitting: Internal Medicine

## 2021-05-14 ENCOUNTER — Other Ambulatory Visit: Payer: Self-pay | Admitting: Internal Medicine

## 2021-06-20 ENCOUNTER — Other Ambulatory Visit: Payer: Self-pay | Admitting: Internal Medicine

## 2021-07-01 ENCOUNTER — Other Ambulatory Visit: Payer: Self-pay | Admitting: Internal Medicine

## 2021-07-02 ENCOUNTER — Other Ambulatory Visit: Payer: Self-pay

## 2021-07-02 ENCOUNTER — Ambulatory Visit: Payer: PPO | Admitting: Orthopaedic Surgery

## 2021-07-02 ENCOUNTER — Ambulatory Visit (INDEPENDENT_AMBULATORY_CARE_PROVIDER_SITE_OTHER): Payer: PPO

## 2021-07-02 ENCOUNTER — Encounter: Payer: Self-pay | Admitting: Orthopaedic Surgery

## 2021-07-02 VITALS — Ht 69.0 in | Wt 145.0 lb

## 2021-07-02 DIAGNOSIS — M545 Low back pain, unspecified: Secondary | ICD-10-CM

## 2021-07-02 DIAGNOSIS — M5442 Lumbago with sciatica, left side: Secondary | ICD-10-CM | POA: Diagnosis not present

## 2021-07-02 DIAGNOSIS — G8929 Other chronic pain: Secondary | ICD-10-CM | POA: Diagnosis not present

## 2021-07-02 MED ORDER — PREDNISONE 50 MG PO TABS: ORAL_TABLET | ORAL | 0 refills | Status: DC

## 2021-07-02 MED ORDER — METHOCARBAMOL 500 MG PO TABS: 500.0000 mg | ORAL_TABLET | Freq: Four times a day (QID) | ORAL | 1 refills | Status: DC | PRN

## 2021-07-02 NOTE — Progress Notes (Signed)
Office Visit Note   Patient: Christina Waller           Date of Birth: January 04, 1956           MRN: [id number] Visit Date: 07/02/2021              Requested by: Biagio Borg, MD [address] [place],  [place] [address] PCP: Biagio Borg, MD   Assessment & Plan: Visit Diagnoses:  1. Chronic left-sided low back pain, unspecified whether sciatica present   2. Acute left-sided low back pain with left-sided sciatica     Plan: At this point I do feel the patient needs a MRI of her lumbar spine given the significance of her left-sided sciatica combined with her radicular symptoms and weakness on that side.  I am going to place her on 5 days of steroid at 50 mg a day as well as Robaxin.  She will work on back extension exercises as well and we will see her back in follow-up once we have the MRI of her lumbar spine.  All question concerns were answered and addressed.  She agrees with this treatment plan.  Follow-Up Instructions: No follow-ups on file.   Orders:  Orders Placed This Encounter  Procedures   XR Lumbar Spine 2-3 Views   Meds ordered this encounter  Medications   predniSONE (DELTASONE) 50 MG tablet    Sig: Take one tablet once daily for 5 days.    Dispense:  5 tablet    Refill:  0   methocarbamol (ROBAXIN) 500 MG tablet    Sig: Take 1 tablet (500 mg total) by mouth every 6 (six) hours as needed.    Dispense:  40 tablet    Refill:  1      Procedures: No procedures performed   Clinical Data: No additional findings.   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  She went tubing about a month ago and she hit her left buttocks her on a rock and felt that this was mainly bursitis.  However she has developed sciatica and she has been getting numbness and tingling and radicular pain going down her left leg.  This is new.  She  does have remote history of spine surgery over 30 years ago.  She says is worse in the morning.  She denies any change in bowel or bladder function.  She is not a diabetic.  HPI  Review of Systems She currently denies any headache, chest pain, shortness of breath, fever, chills, nausea, vomiting  Objective: Vital Signs: Ht 5\' 9"  (1.753 m)   Wt 145 lb (65.8 kg)   LMP 07/13/2006 (Approximate)   BMI 21.41 kg/m   Physical Exam She is alert and oriented x3 and in no acute distress Ortho Exam On examination she has a positive straight leg raise to the left side.  Her hamstrings are incredibly tight and she has significant limitations in flexion-extension of her lumbar spine.  She has subjective decreased sensation on the lateral aspect of her left leg and left foot.  There is slightly diminished reflexes as well on the left side. Specialty Comments:  No specialty comments available.  Imaging: XR Lumbar Spine 2-3 Views  Result Date: 07/02/2021 2 views lumbar spine show a significant degenerative lumbar scoliosis.  There is also significant degenerative changes and  narrowing of the disc space between L5 and S1.  There appears to be a large bone spur in the foramina between the L4 and L5 disc space.    PMFS History: Patient Active Problem List   Diagnosis Date Noted   Hand arthritis 02/06/2021   Osteopenia 02/06/2021   Aortic atherosclerosis ([place]) 02/06/2021   Viral illness 07/30/2019   Bronchiectasis ([place]) 01/05/2019   Chronic cough 09/16/2017   Acute upper respiratory infection 03/30/2017   Migraine 03/30/2017   Cough 11/09/2016   Bunion, left foot 10/23/2015   Skin lesion 10/23/2015   Lower back pain 09/15/2014   Right hip pain 09/14/2013   Preventative health care 06/29/2011   HEMORRHOIDS-EXTERNAL 01/09/2010   OTH&UNSPEC NONINFECTIOUS GASTROENTERITIS&COLITIS 12/02/2009   OSTEOPENIA 02/14/2009   FATIGUE 08/16/2008   DYSPNEA 08/16/2008   CARPAL TUNNEL SYNDROME, LEFT  05/21/2008   HLD (hyperlipidemia) 04/12/2008   ANEMIA-NOS 04/12/2008   Allergic rhinitis 04/12/2008   Anxiety state 10/31/2007   Depression 10/31/2007   FIBROMYALGIA 10/31/2007   Past Medical History:  Diagnosis Date   Allergic rhinitis    Anemia    NOS   Anxiety    Bronchiectasis ([place]) 01/05/2019   Depression    Fibromyalgia    Headache(784.0)    HNP (herniated nucleus pulposus), lumbar    L 2-3   Hyperlipidemia    Migraines    Osteopenia    Restless leg syndrome    STD (sexually transmitted disease)    HSV that outbreaks mid back   Unspecified vitamin D deficiency     Family History  Problem Relation Age of Onset   Ovarian cancer Mother 61   Osteoarthritis Mother    Depression Mother    Heart disease Father    Depression Sister    Hypertension Maternal Grandmother    Osteoporosis Maternal Grandmother    Ulcers Maternal Grandmother    Heart disease Maternal Grandfather    Colon cancer Neg Hx    Colon polyps Neg Hx    Esophageal cancer Neg Hx    Rectal cancer Neg Hx    Stomach cancer Neg Hx     Past Surgical History:  Procedure Laterality Date   AUGMENTATION MAMMAPLASTY Bilateral 5/01   saline   BACK SURGERY     BREAST SURGERY     COLONOSCOPY  10/2004   normal   SHOULDER SURGERY Right    SIGMOIDOSCOPY  2011   TUBAL LIGATION     bilat   Social History   Occupational History   Occupation: Garment/textile technologist: [employer]  Tobacco Use   Smoking status: Never   Smokeless tobacco: Never  Vaping Use   Vaping Use: Never used  Substance and Sexual Activity   Alcohol use: Yes    Alcohol/week: 4.0 standard drinks    Types: 4 Glasses of wine per week   Drug use: No   Sexual activity: Yes    Partners: Male    Birth control/protection: Surgical    Comment: BTL

## 2021-07-03 ENCOUNTER — Other Ambulatory Visit: Payer: Self-pay | Admitting: Orthopaedic Surgery

## 2021-07-03 DIAGNOSIS — M545 Low back pain, unspecified: Secondary | ICD-10-CM

## 2021-07-06 ENCOUNTER — Telehealth: Payer: Self-pay | Admitting: Orthopaedic Surgery

## 2021-07-06 NOTE — Telephone Encounter (Signed)
Pt states that the prednisone is not working at all and she needs someone to give her a call to talk about what can be done.   CB [phone]

## 2021-07-07 ENCOUNTER — Ambulatory Visit
Admission: RE | Admit: 2021-07-07 | Discharge: 2021-07-07 | Disposition: A | Discharge status: Home / Self Care | Payer: PPO | Source: Ambulatory Visit | Attending: Orthopaedic Surgery | Admitting: Orthopaedic Surgery

## 2021-07-07 ENCOUNTER — Other Ambulatory Visit: Payer: Self-pay

## 2021-07-07 DIAGNOSIS — M545 Low back pain, unspecified: Secondary | ICD-10-CM | POA: Diagnosis not present

## 2021-07-07 DIAGNOSIS — M48061 Spinal stenosis, lumbar region without neurogenic claudication: Secondary | ICD-10-CM | POA: Diagnosis not present

## 2021-07-08 ENCOUNTER — Other Ambulatory Visit: Payer: Self-pay | Admitting: Orthopaedic Surgery

## 2021-07-08 ENCOUNTER — Telehealth: Payer: Self-pay | Admitting: Orthopaedic Surgery

## 2021-07-08 DIAGNOSIS — M545 Low back pain, unspecified: Secondary | ICD-10-CM

## 2021-07-08 MED ORDER — HYDROCODONE-ACETAMINOPHEN 5-325 MG PO TABS: 1.0000 | ORAL_TABLET | Freq: Four times a day (QID) | ORAL | 0 refills | Status: DC | PRN

## 2021-07-08 NOTE — Telephone Encounter (Signed)
Pt called and got her test results on mychart and pt is concerned. She would like to have someone to call her as soon as possible. She didn't even go to work this morning because of how much pain she was in.   CB [phone]

## 2021-07-08 NOTE — Addendum Note (Signed)
Addended by: Robyne Peers on: 07/08/2021 02:01 PM   Modules accepted: Orders

## 2021-07-08 NOTE — Telephone Encounter (Signed)
Referral placed in chart. I called pt and informed her and she stated understanding. She would like to get in as soon as possible due to pain

## 2021-07-08 NOTE — Telephone Encounter (Signed)
Please advise 

## 2021-07-09 ENCOUNTER — Other Ambulatory Visit: Payer: Self-pay | Admitting: Internal Medicine

## 2021-07-09 ENCOUNTER — Telehealth: Payer: Self-pay | Admitting: Orthopaedic Surgery

## 2021-07-09 NOTE — Telephone Encounter (Signed)
Noted will work on referral

## 2021-07-09 NOTE — Telephone Encounter (Signed)
Pt asking to be referred to Dr. Erline Levine ([address], [place], [place] [address]) for Neuro. The phone number for the provider is [phone]. The best phone number for the pt is [phone].

## 2021-07-15 DIAGNOSIS — M5416 Radiculopathy, lumbar region: Secondary | ICD-10-CM | POA: Diagnosis not present

## 2021-07-25 ENCOUNTER — Other Ambulatory Visit: Payer: Self-pay | Admitting: Internal Medicine

## 2021-08-04 ENCOUNTER — Other Ambulatory Visit: Payer: Self-pay | Admitting: Internal Medicine

## 2021-08-04 DIAGNOSIS — M5416 Radiculopathy, lumbar region: Secondary | ICD-10-CM | POA: Diagnosis not present

## 2021-08-05 ENCOUNTER — Other Ambulatory Visit: Payer: Self-pay | Admitting: Internal Medicine

## 2021-08-22 ENCOUNTER — Other Ambulatory Visit: Payer: Self-pay | Admitting: Internal Medicine

## 2021-08-26 ENCOUNTER — Other Ambulatory Visit: Payer: Self-pay | Admitting: Internal Medicine

## 2021-08-28 ENCOUNTER — Other Ambulatory Visit: Payer: Self-pay | Admitting: Internal Medicine

## 2021-09-01 DIAGNOSIS — M5416 Radiculopathy, lumbar region: Secondary | ICD-10-CM | POA: Diagnosis not present

## 2021-09-04 ENCOUNTER — Ambulatory Visit
Admission: EM | Admit: 2021-09-04 | Discharge: 2021-09-04 | Disposition: A | Discharge status: Home / Self Care | Payer: PPO | Attending: Emergency Medicine | Admitting: Emergency Medicine

## 2021-09-04 ENCOUNTER — Other Ambulatory Visit: Payer: Self-pay

## 2021-09-04 ENCOUNTER — Encounter: Payer: Self-pay | Admitting: Emergency Medicine

## 2021-09-04 DIAGNOSIS — S91011A Laceration without foreign body, right ankle, initial encounter: Secondary | ICD-10-CM | POA: Diagnosis not present

## 2021-09-04 NOTE — ED Provider Notes (Signed)
[name]    CSN: [id number] Arrival date & time: 09/04/21  1055      History   Chief Complaint Chief Complaint  Patient presents with   Laceration    HPI Christina Waller is a 65 y.o. female.  Patient presents with a laceration on her right ankle which occurred last night.  She cut her ankle on the storm door.  Bleeding controlled with direct pressure and Steri-Strips.  She states when she awoke this morning the bleeding had restarted and the Steri-Strips to come off.  She denies numbness, weakness, paresthesias, fever, purulent drainage, redness,, or other symptoms.  Last tetanus November 2017.  Patient is currently on amoxicillin for a dental infection.  The history is provided by the patient and medical records.   Past Medical History:  Diagnosis Date   Allergic rhinitis    Anemia    NOS   Anxiety    Bronchiectasis ([place]) 01/05/2019   Depression    Fibromyalgia    Headache(784.0)    HNP (herniated nucleus pulposus), lumbar    L 2-3   Hyperlipidemia    Migraines    Osteopenia    Restless leg syndrome    STD (sexually transmitted disease)    HSV that outbreaks mid back   Unspecified vitamin D deficiency     Patient Active Problem List   Diagnosis Date Noted   Hand arthritis 02/06/2021   Osteopenia 02/06/2021   Aortic atherosclerosis ([place]) 02/06/2021   Viral illness 07/30/2019   Bronchiectasis ([place]) 01/05/2019   Chronic cough 09/16/2017   Acute upper respiratory infection 03/30/2017   Migraine 03/30/2017   Cough 11/09/2016   Bunion, left foot 10/23/2015   Skin lesion 10/23/2015   Lower back pain 09/15/2014   Right hip pain 09/14/2013   Preventative health care 06/29/2011   HEMORRHOIDS-EXTERNAL 01/09/2010   OTH&UNSPEC NONINFECTIOUS GASTROENTERITIS&COLITIS 12/02/2009   OSTEOPENIA 02/14/2009   FATIGUE 08/16/2008   DYSPNEA 08/16/2008   CARPAL TUNNEL SYNDROME, LEFT 05/21/2008   HLD (hyperlipidemia) 04/12/2008   ANEMIA-NOS 04/12/2008   Allergic  rhinitis 04/12/2008   Anxiety state 10/31/2007   Depression 10/31/2007   FIBROMYALGIA 10/31/2007    Past Surgical History:  Procedure Laterality Date   AUGMENTATION MAMMAPLASTY Bilateral 5/01   saline   BACK SURGERY     BREAST SURGERY     COLONOSCOPY  10/2004   normal   SHOULDER SURGERY Right    SIGMOIDOSCOPY  2011   TUBAL LIGATION     bilat    OB History     Gravida  3   Para  3   Term  3   Preterm  0   AB  0   Living  3      SAB  0   IAB  0   Ectopic  0   Multiple  0   Live Births  3            Home Medications    Prior to Admission medications   Medication Sig Start Date End Date Taking? Authorizing Provider  celecoxib (CELEBREX) 200 MG capsule Take 1 capsule (200 mg total) by mouth 2 (two) times daily as needed for mild pain. 02/06/21  Yes Biagio Borg, MD  clonazePAM (KLONOPIN) 0.5 MG tablet TAKE 1 TABLET BY MOUTH TWICE A DAY AS NEEDED 03/17/21  Yes Biagio Borg, MD  DULoxetine (CYMBALTA) 60 MG capsule Take 1 capsule (60 mg total) by mouth 2 (two) times daily. 02/22/21  Yes John,  Hunt Oris, MD  HYDROcodone-acetaminophen (NORCO/VICODIN) 5-325 MG tablet Take 1 tablet by mouth every 6 (six) hours as needed for moderate pain. 07/08/21   Mcarthur Rossetti, MD  Omega-3 Fatty Acids (OMEGA-3 FISH OIL PO) Take by mouth.   Yes [provider]  rosuvastatin (CRESTOR) 40 MG tablet TAKE 1 TABLET BY MOUTH EVERY DAY 04/07/21  Yes Biagio Borg, MD  SUMAtriptan (IMITREX) 100 MG tablet PLEASE SEE ATTACHED FOR DETAILED DIRECTIONS 01/29/21  Yes Biagio Borg, MD  Turmeric (QC TUMERIC COMPLEX PO) Take by mouth.   Yes [provider]  UNABLE TO FIND Med Name: [place]   Yes [provider]  Aspirin-Acetaminophen-Caffeine (GOODY HEADACHE PO) Take by mouth.    [provider]  Cetirizine HCl (ZYRTEC ALLERGY) 10 MG CAPS Zyrtec    [provider]  cyclobenzaprine (FLEXERIL) 10 MG tablet TAKE 1 TABLET (10 MG TOTAL) BY MOUTH  AT BEDTIME. KEEP SCHEDULED APPT FOR FUTURE REFILLS 08/28/21   Biagio Borg, MD  methocarbamol (ROBAXIN) 500 MG tablet Take 1 tablet (500 mg total) by mouth every 6 (six) hours as needed. 07/02/21   Mcarthur Rossetti, MD  predniSONE (DELTASONE) 50 MG tablet Take one tablet once daily for 5 days. 07/02/21   Mcarthur Rossetti, MD  traMADol (ULTRAM) 50 MG tablet TAKE 1 TABLET (50 MG TOTAL) BY MOUTH DAILY AS NEEDED. FOR PAIN 05/14/21   Biagio Borg, MD  valACYclovir (VALTREX) 1000 MG tablet TAKE 1 TABLET BY MOUTH TWICE A DAY 08/26/21   Biagio Borg, MD    Family History Family History  Problem Relation Age of Onset   Ovarian cancer Mother 22   Osteoarthritis Mother    Depression Mother    Heart disease Father    Depression Sister    Hypertension Maternal Grandmother    Osteoporosis Maternal Grandmother    Ulcers Maternal Grandmother    Heart disease Maternal Grandfather    Colon cancer Neg Hx    Colon polyps Neg Hx    Esophageal cancer Neg Hx    Rectal cancer Neg Hx    Stomach cancer Neg Hx     Social History Social History   Tobacco Use   Smoking status: Never   Smokeless tobacco: Never  Vaping Use   Vaping Use: Never used  Substance Use Topics   Alcohol use: Yes    Alcohol/week: 4.0 standard drinks    Types: 4 Glasses of wine per week   Drug use: No     Allergies   Clindamycin/lincomycin   Review of Systems Review of Systems  Constitutional:  Negative for chills and fever.  Respiratory:  Negative for cough and shortness of breath.   Cardiovascular:  Negative for chest pain and palpitations.  Musculoskeletal:  Negative for arthralgias and back pain.  Skin:  Positive for wound. Negative for color change.  Neurological:  Negative for weakness and numbness.  All other systems reviewed and are negative.   Physical Exam Triage Vital Signs ED Triage Vitals  Enc Vitals Group     BP      Pulse      Resp      Temp      Temp src      SpO2      Weight       Height      Head Circumference      Peak Flow      Pain Score      Pain Loc  Pain Edu?      Excl. in [place]?    No data found.  Updated Vital Signs BP 128/74   Pulse 90   Temp 98.3 F (36.8 C)   Resp 20   LMP 07/13/2006 (Approximate)   SpO2 98%   Visual Acuity Right Eye Distance:   Left Eye Distance:   Bilateral Distance:    Right Eye Near:   Left Eye Near:    Bilateral Near:     Physical Exam Vitals and nursing note reviewed.  Constitutional:      General: She is not in acute distress.    Appearance: She is well-developed. She is not ill-appearing.  HENT:     Head: Normocephalic and atraumatic.     Mouth/Throat:     Mouth: Mucous membranes are moist.  Eyes:     Conjunctiva/sclera: Conjunctivae normal.  Cardiovascular:     Rate and Rhythm: Normal rate and regular rhythm.     Heart sounds: Normal heart sounds.  Pulmonary:     Effort: Pulmonary effort is normal. No respiratory distress.     Breath sounds: Normal breath sounds.  Abdominal:     Palpations: Abdomen is soft.     Tenderness: There is no abdominal tenderness.  Musculoskeletal:        General: No swelling or deformity. Normal range of motion.     Cervical back: Neck supple.  Skin:    General: Skin is warm and dry.     Capillary Refill: Capillary refill takes less than 2 seconds.     Findings: Lesion present.     Comments: 6 cm laceration on right medial ankle. No active bleeding.   Neurological:     General: No focal deficit present.     Mental Status: She is alert and oriented to person, place, and time.     Sensory: No sensory deficit.     Motor: No weakness.  Psychiatric:        Mood and Affect: Mood normal.        Behavior: Behavior normal.     UC Treatments / Results  Labs (all labs ordered are listed, but only abnormal results are displayed) Labs Reviewed - No data to display  EKG   Radiology No results found.  Procedures Laceration Repair  Date/Time: 09/04/2021 12:30  PM Performed by: Sharion Balloon, NP Authorized by: Sharion Balloon, NP   Consent:    Consent obtained:  Verbal   Consent given by:  Patient   Risks discussed:  Infection, pain, poor cosmetic result and poor wound healing Universal protocol:    Procedure explained and questions answered to patient or proxy's satisfaction: yes   Anesthesia:    Anesthesia method:  Local infiltration   Local anesthetic:  Lidocaine 1% w/o epi Laceration details:    Location:  Leg   Leg location:  R lower leg   Length (cm):  6   Depth (mm):  3 Pre-procedure details:    Preparation:  Patient was prepped and draped in usual sterile fashion Exploration:    Hemostasis achieved with:  Direct pressure   Wound exploration: wound explored through full range of motion and entire depth of wound visualized     Contaminated: no   Treatment:    Area cleansed with:  Povidone-iodine   Amount of cleaning:  Standard   Irrigation solution:  Sterile saline   Irrigation method:  Syringe   Visualized foreign bodies/material removed: no   Skin repair:    Repair  method:  Sutures   Suture size:  3-0   Suture material:  Nylon   Number of sutures:  9 Approximation:    Approximation:  Close Repair type:    Repair type:  Simple Post-procedure details:    Dressing:  Antibiotic ointment and non-adherent dressing   Procedure completion:  Tolerated well, no immediate complications (including critical care time)  Medications Ordered in UC Medications - No data to display  Initial Impression / Assessment and Plan / UC Course  I have reviewed the triage vital signs and the nursing notes.  Pertinent labs & imaging results that were available during my care of the patient were reviewed by me and considered in my medical decision making (see chart for details).  Laceration of right ankle.  9 sutures.  Wound care instructions and signs of infection discussed.  Patient is currently on amoxicillin for a dental infection.  Tetanus  up-to-date.  Instructed patient to return here in 7 to 10 days for suture removal.  Instructed her to return right away if she notes signs of infection.  Patient agrees to plan of care.   Final Clinical Impressions(s) / UC Diagnoses   Final diagnoses:  Laceration of right ankle, initial encounter     Discharge Instructions      Your stitches need to be removed in 7 to 10 days.  You can return here for this.    Keep the wound clean and dry.  Wash it gently twice a day with soap and water.  For the first 2 to 3 days, apply an antibiotic ointment and bandage.    Return here right away if you note signs of infection such as fever, redness, pus-like drainage, or other concerns.         ED Prescriptions   None    PDMP not reviewed this encounter.   Sharion Balloon, NP 09/04/21 [phone]

## 2021-09-04 NOTE — Discharge Instructions (Addendum)
Your stitches need to be removed in 7 to 10 days.  You can return here for this.    Keep the wound clean and dry.  Wash it gently twice a day with soap and water.  For the first 2 to 3 days, apply an antibiotic ointment and bandage.    Return here right away if you note signs of infection such as fever, redness, pus-like drainage, or other concerns.

## 2021-09-04 NOTE — ED Triage Notes (Addendum)
Pt here with laceration on inside of right ankle from storm dor last night around 7pm. Tried to get seen for closure and nowhere was open. Attempted closure herself with steristrips, but unsuccessful. Last Tdap November 2017

## 2021-09-12 ENCOUNTER — Other Ambulatory Visit: Payer: Self-pay | Admitting: Internal Medicine

## 2021-09-27 ENCOUNTER — Other Ambulatory Visit: Payer: Self-pay | Admitting: Internal Medicine

## 2021-10-02 DIAGNOSIS — H2513 Age-related nuclear cataract, bilateral: Secondary | ICD-10-CM | POA: Diagnosis not present

## 2021-10-02 DIAGNOSIS — H5213 Myopia, bilateral: Secondary | ICD-10-CM | POA: Diagnosis not present

## 2021-10-11 ENCOUNTER — Other Ambulatory Visit: Payer: Self-pay | Admitting: Internal Medicine

## 2021-10-15 ENCOUNTER — Other Ambulatory Visit: Payer: Self-pay | Admitting: Internal Medicine

## 2021-11-04 ENCOUNTER — Other Ambulatory Visit: Payer: Self-pay | Admitting: Internal Medicine

## 2021-11-10 ENCOUNTER — Other Ambulatory Visit: Payer: Self-pay | Admitting: Internal Medicine

## 2021-11-19 DIAGNOSIS — Z20822 Contact with and (suspected) exposure to covid-19: Secondary | ICD-10-CM | POA: Diagnosis not present

## 2021-11-23 ENCOUNTER — Ambulatory Visit: Payer: PPO | Admitting: Internal Medicine

## 2021-11-23 IMAGING — MR MR LUMBAR SPINE W/O CM
4 of 6 series · 25 of 48 positions shown · non-contrast
Comparison: None.

CLINICAL DATA: Acute lower back pain radiating into the left hip

EXAM:
MRI LUMBAR SPINE WITHOUT CONTRAST
TECHNIQUE: Multiplanar, multisequence MR imaging of the lumbar spine was
performed. No intravenous contrast was administered.

[Series 3: T2 · sagittal · 4.0mm · 0.53mm/px · 5 of 18 slices shown (1 of 3)]
[im 1/18]
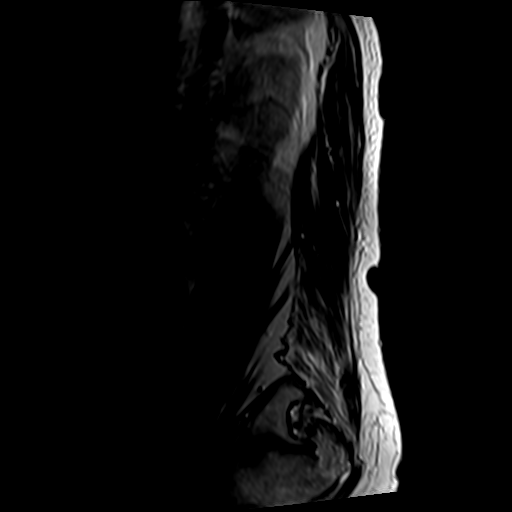
[im 5/18]
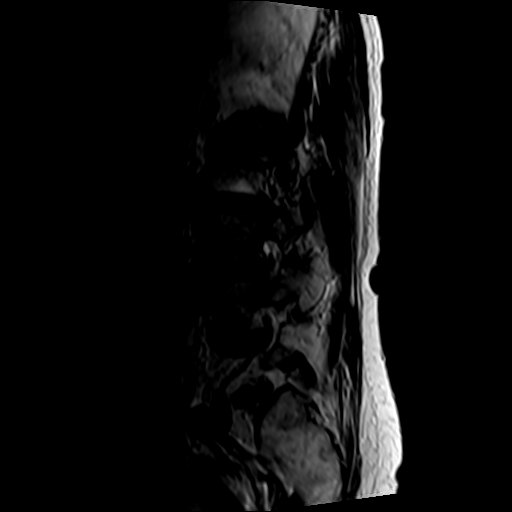
[im 9/18]
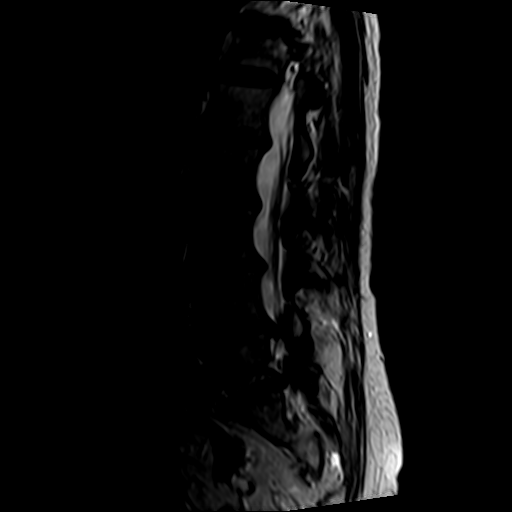
[im 13/18]
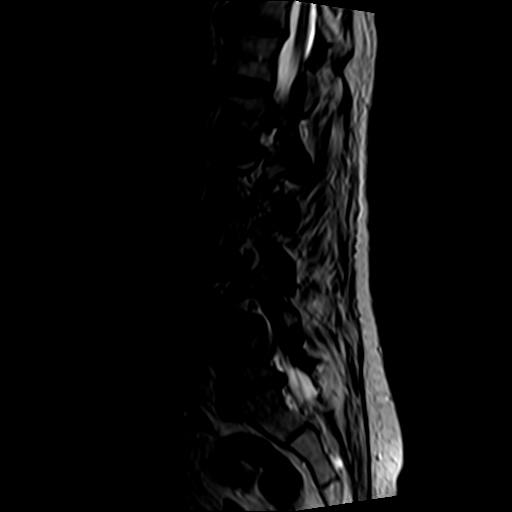
[im 18/18]
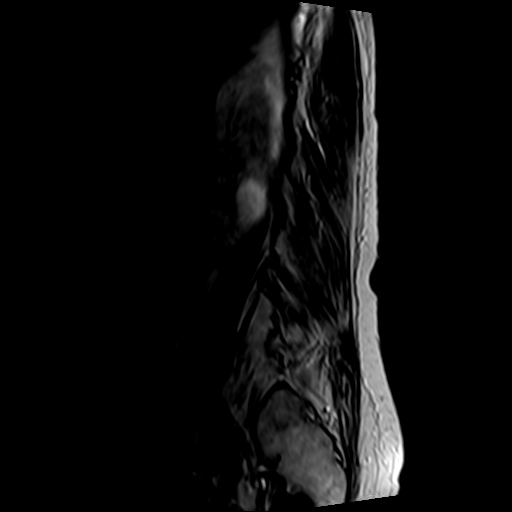

[Series 5: T1 · sagittal · 4.0mm · 0.53mm/px · 5 of 18 slices shown]
[im 1/18]
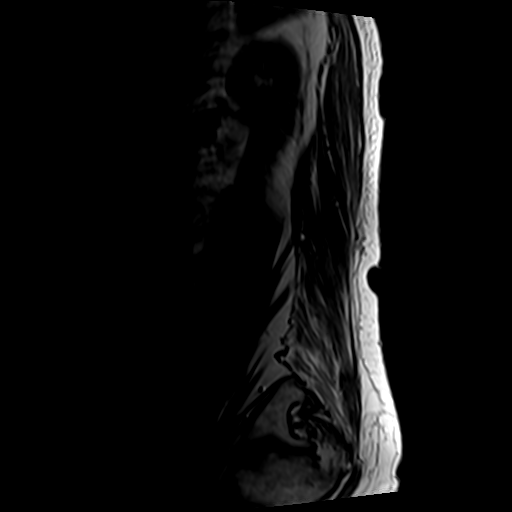
[im 4/18]
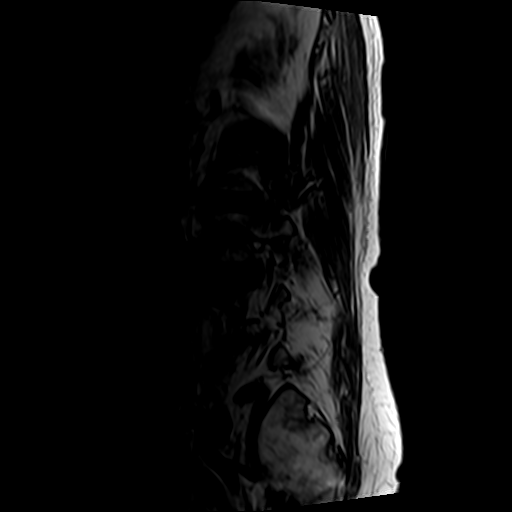
[im 7/18]
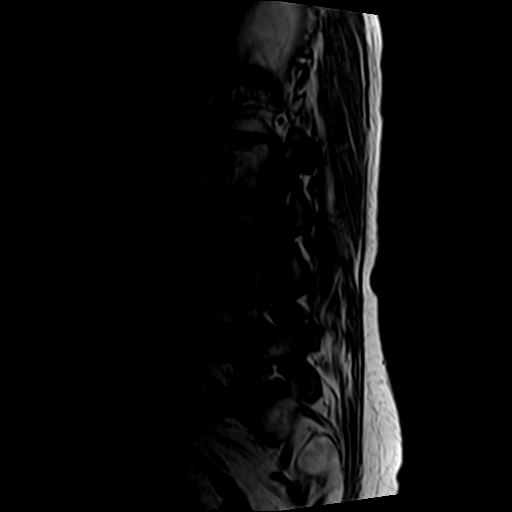
[im 11/18]
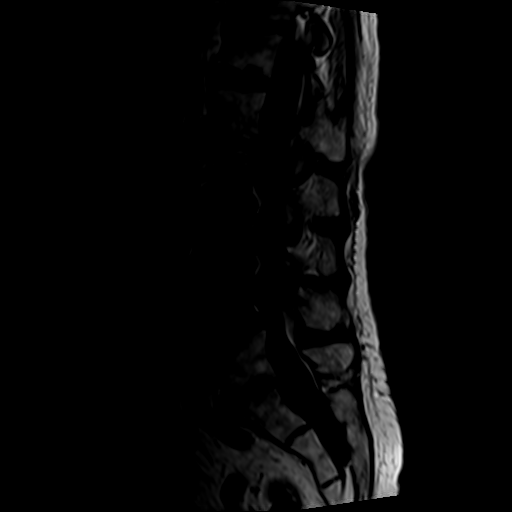
[im 18/18]
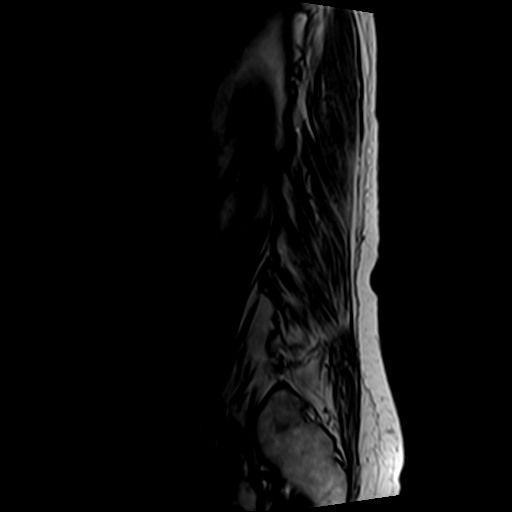

[Series 6: T2 · axial · 4.0mm · 0.70mm/px · z∈[-4,+193]mm · 9 of 40 slices shown (2 of 3)]
[im 1/40]
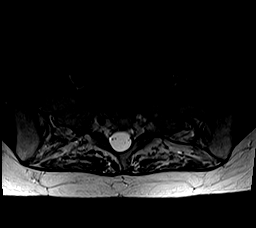
[im 7/40]
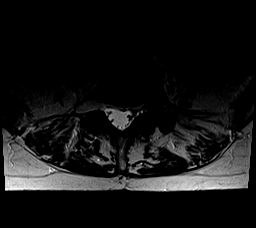
[im 14/40]
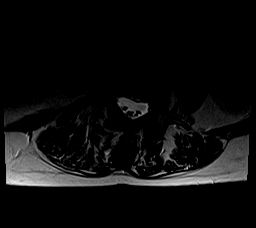
[im 17/40]
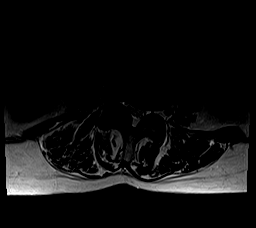
[im 20/40]
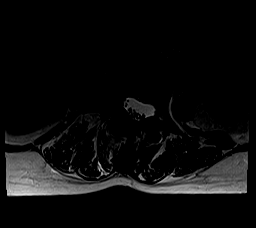
[im 23/40]
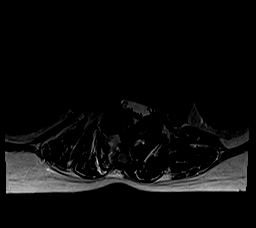
[im 27/40]
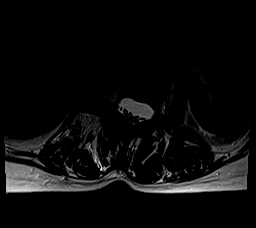
[im 33/40]
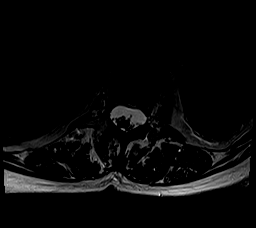
[im 40/40]
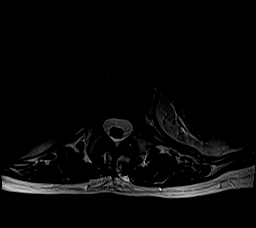

[Series 8: T2 · coronal · 8.0mm · 0.57mm/px · 6 of 18 slices shown (3 of 3)]
[im 1/18]
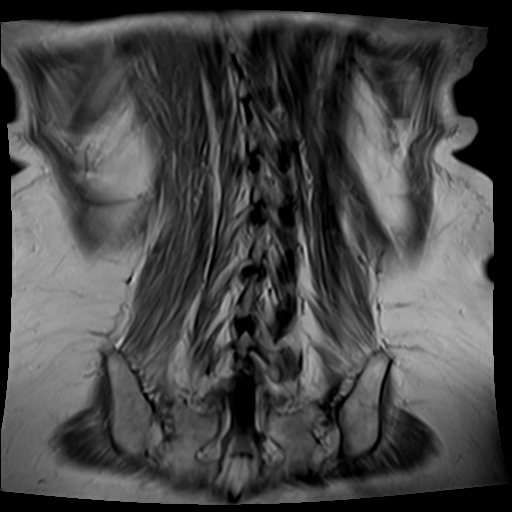
[im 4/18]
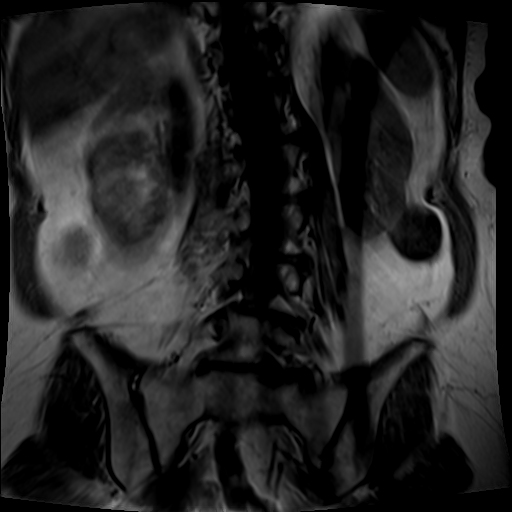
[im 7/18]
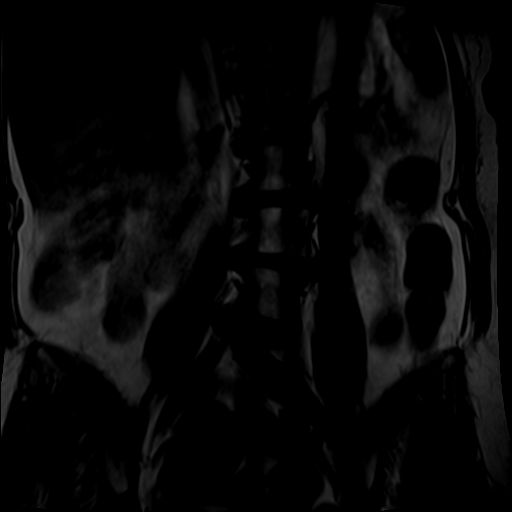
[im 11/18]
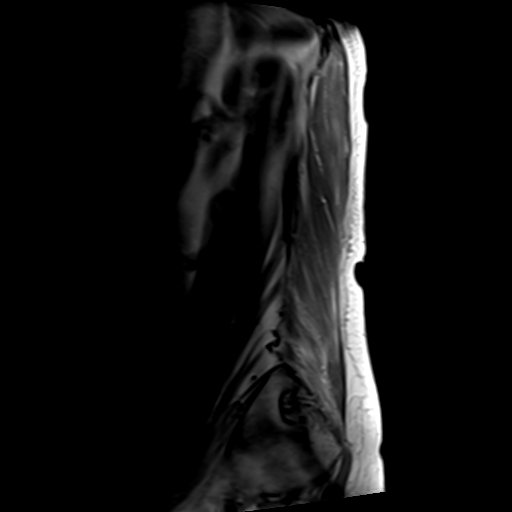
[im 14/18]
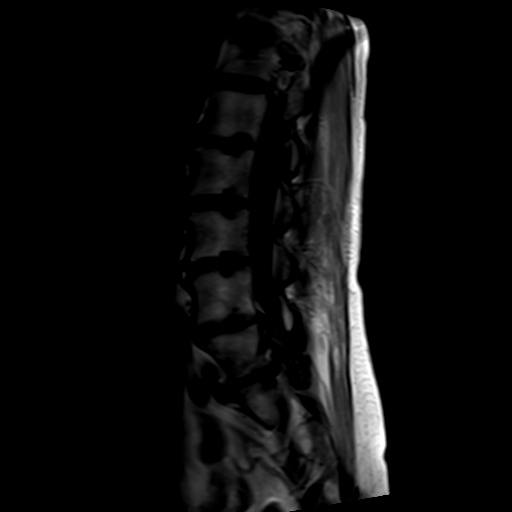
[im 18/18]
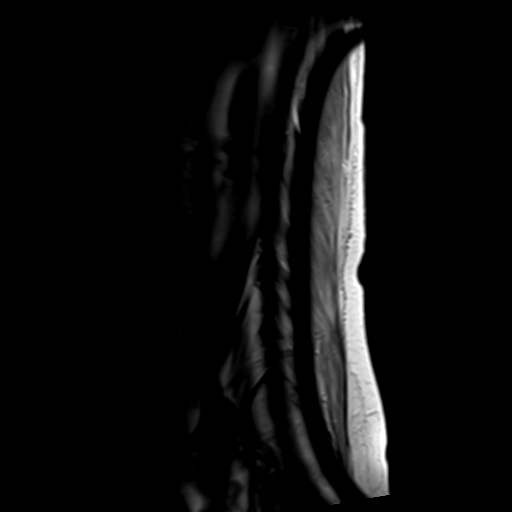

[25 of 48 positions shown; findings below may reference images not displayed]

FINDINGS: Segmentation:  5 lumbar type vertebrae

Alignment:  Levoscoliosis and L4-5 grade 1 anterolisthesis

Vertebrae:  No fracture, evidence of discitis, or bone lesion.

Conus medullaris and cauda equina: Conus extends to the L1-2 level.
Conus and cauda equina appear normal.

Paraspinal and other soft tissues: Fatty atrophy of intrinsic back
muscles.

Disc levels:

T12- L1: Incidental root sleeve cyst at the right foramen

L1-L2: Disc narrowing and bulging with asymmetric right-sided height
loss

L2-L3: Disc narrowing and bulging

L3-L4: Disc narrowing and bulging eccentric towards the right where
there is also facet spurring. Moderate thecal sac narrowing. Mild
right foraminal narrowing.

L4-L5: Disc narrowing and bulging. Facet osteoarthritis with
spurring and ligamentum flavum thickening. High-grade spinal
stenosis with cauda equina compression. Mild bilateral foraminal
narrowing

L5-S1:Disc collapse and endplate degeneration with eccentric left
spurring of the endplates and facets. Mild left foraminal narrowing.
Small right paracentral protrusion contacting but not compressing
the right S1 nerve root.
IMPRESSION: 1. Generalized lumbar spine degeneration with scoliosis.
2. Spinal stenosis that is moderate at L3-4 and advanced at L4-5.

## 2021-11-30 ENCOUNTER — Other Ambulatory Visit: Payer: Self-pay | Admitting: Internal Medicine

## 2021-11-30 DIAGNOSIS — Z8616 Personal history of COVID-19: Secondary | ICD-10-CM | POA: Diagnosis not present

## 2021-12-10 ENCOUNTER — Other Ambulatory Visit: Payer: Self-pay | Admitting: Internal Medicine

## 2022-02-06 ENCOUNTER — Other Ambulatory Visit: Payer: Self-pay | Admitting: Internal Medicine

## 2022-02-12 ENCOUNTER — Encounter: Payer: Self-pay | Admitting: Internal Medicine

## 2022-02-12 ENCOUNTER — Ambulatory Visit (INDEPENDENT_AMBULATORY_CARE_PROVIDER_SITE_OTHER): Payer: PPO | Admitting: Internal Medicine

## 2022-02-12 ENCOUNTER — Other Ambulatory Visit: Payer: Self-pay

## 2022-02-12 VITALS — BP 120/78 | HR 83 | Temp 99.0°F | Ht 69.0 in | Wt 145.0 lb

## 2022-02-12 DIAGNOSIS — E538 Deficiency of other specified B group vitamins: Secondary | ICD-10-CM | POA: Diagnosis not present

## 2022-02-12 DIAGNOSIS — M5441 Lumbago with sciatica, right side: Secondary | ICD-10-CM | POA: Diagnosis not present

## 2022-02-12 DIAGNOSIS — R002 Palpitations: Secondary | ICD-10-CM | POA: Diagnosis not present

## 2022-02-12 DIAGNOSIS — I700 Atherosclerosis of aorta: Secondary | ICD-10-CM | POA: Diagnosis not present

## 2022-02-12 DIAGNOSIS — R739 Hyperglycemia, unspecified: Secondary | ICD-10-CM

## 2022-02-12 DIAGNOSIS — E7800 Pure hypercholesterolemia, unspecified: Secondary | ICD-10-CM | POA: Diagnosis not present

## 2022-02-12 DIAGNOSIS — E559 Vitamin D deficiency, unspecified: Secondary | ICD-10-CM | POA: Diagnosis not present

## 2022-02-12 DIAGNOSIS — G8929 Other chronic pain: Secondary | ICD-10-CM

## 2022-02-12 LAB — CBC WITH DIFFERENTIAL/PLATELET
Basophils Absolute: 0 10*3/uL (ref 0.0–0.1)
Basophils Relative: 0.4 % (ref 0.0–3.0)
Eosinophils Absolute: 0.1 10*3/uL (ref 0.0–0.7)
Eosinophils Relative: 1.3 % (ref 0.0–5.0)
HCT: 34.2 % — ABNORMAL LOW (ref 36.0–46.0)
Hemoglobin: 11.6 g/dL — ABNORMAL LOW (ref 12.0–15.0)
Lymphocytes Relative: 19.2 % (ref 12.0–46.0)
Lymphs Abs: 1.9 10*3/uL (ref 0.7–4.0)
MCHC: 34 g/dL (ref 30.0–36.0)
MCV: 96.2 fl (ref 78.0–100.0)
Monocytes Absolute: 0.8 10*3/uL (ref 0.1–1.0)
Monocytes Relative: 7.9 % (ref 3.0–12.0)
Neutro Abs: 6.9 10*3/uL (ref 1.4–7.7)
Neutrophils Relative %: 71.2 % (ref 43.0–77.0)
Platelets: 213 10*3/uL (ref 150.0–400.0)
RBC: 3.55 Mil/uL — ABNORMAL LOW (ref 3.87–5.11)
RDW: 13.8 % (ref 11.5–15.5)
WBC: 9.7 10*3/uL (ref 4.0–10.5)

## 2022-02-12 LAB — HEPATIC FUNCTION PANEL
ALT: 14 U/L (ref 0–35)
AST: 17 U/L (ref 0–37)
Albumin: 4.1 g/dL (ref 3.5–5.2)
Alkaline Phosphatase: 61 U/L (ref 39–117)
Bilirubin, Direct: 0 mg/dL (ref 0.0–0.3)
Total Bilirubin: 0.4 mg/dL (ref 0.2–1.2)
Total Protein: 6.8 g/dL (ref 6.0–8.3)

## 2022-02-12 LAB — BASIC METABOLIC PANEL
BUN: 23 mg/dL (ref 6–23)
CO2: 28 mEq/L (ref 19–32)
Calcium: 9.3 mg/dL (ref 8.4–10.5)
Chloride: 102 mEq/L (ref 96–112)
Creatinine, Ser: 0.97 mg/dL (ref 0.40–1.20)
GFR: 60.89 mL/min (ref >60.00)
Glucose, Bld: 120 mg/dL — ABNORMAL HIGH (ref 70–99)
Potassium: 4.3 mEq/L (ref 3.5–5.1)
Sodium: 137 mEq/L (ref 135–145)

## 2022-02-12 LAB — LIPID PANEL
Cholesterol: 231 mg/dL — ABNORMAL HIGH (ref 0–200)
HDL: 73.3 mg/dL (ref >39.00)
LDL Cholesterol: 129 mg/dL — ABNORMAL HIGH (ref 0–99)
NonHDL: 157.2
Total CHOL/HDL Ratio: 3
Triglycerides: 141 mg/dL (ref 0.0–149.0)
VLDL: 28.2 mg/dL (ref 0.0–40.0)

## 2022-02-12 LAB — URINALYSIS, ROUTINE W REFLEX MICROSCOPIC
Bilirubin Urine: NEGATIVE
Hgb urine dipstick: NEGATIVE
Ketones, ur: NEGATIVE
Leukocytes,Ua: NEGATIVE
Nitrite: NEGATIVE
RBC / HPF: NONE SEEN (ref 0–?)
Specific Gravity, Urine: 1.015 (ref 1.000–1.030)
Total Protein, Urine: NEGATIVE
Urine Glucose: NEGATIVE
Urobilinogen, UA: 0.2 (ref 0.0–1.0)
pH: 5.5 (ref 5.0–8.0)

## 2022-02-12 LAB — TSH: TSH: 2.94 u[IU]/mL (ref 0.35–5.50)

## 2022-02-12 LAB — HEMOGLOBIN A1C: Hgb A1c MFr Bld: 5.6 % (ref 4.6–6.5)

## 2022-02-12 LAB — VITAMIN B12: Vitamin B-12: 371 pg/mL (ref 211–911)

## 2022-02-12 LAB — VITAMIN D 25 HYDROXY (VIT D DEFICIENCY, FRACTURES): VITD: 43.41 ng/mL (ref 30.00–100.00)

## 2022-02-12 NOTE — Patient Instructions (Signed)
Your EKG was done today ? ?You will be contacted regarding the referral for: echocardiogram, and cardiac monitor ? ?Please continue all other medications as before, and refills have been done if requested. ? ?Please have the pharmacy call with any other refills you may need. ? ?Please keep your appointments with your specialists as you may have planned ? ?Please go to the LAB at the blood drawing area for the tests to be done ? ?You will be contacted by phone if any changes need to be made immediately.  Otherwise, you will receive a letter about your results with an explanation, but please check with MyChart first. ? ?Please remember to sign up for MyChart if you have not done so, as this will be important to you in the future with finding out test results, communicating by private email, and scheduling acute appointments online when needed. ? ? ?

## 2022-02-12 NOTE — Progress Notes (Signed)
Patient ID: Christina Waller, female   DOB: 1956-02-10, 66 y.o.   MRN: [id number] ? ? ? ?    Chief Complaint: follow up palpitaitons, HTN, HLD and aortic atherosclerosis ? ?     HPI:  Christina Waller is a 66 y.o. female here with c/o 2 wks onset daily intermittent random but several per hour noticeable palpitations such as feels like skipped beats and few hard beats.  Pt denies chest pain, increased sob or doe, wheezing, orthopnea, PND, increased LE swelling, palpitations, or syncope, but has had mild light headed dizzy at times.  Has been working very hard as Arboriculturist, Pt continues to have recurring LBP without change in severity, bowel or bladder change, fever, wt loss,  worsening LE pain/numbness/weakness, gait change or falls, but has to lean over essentially for 8 hrs from 8am to 4 pm every day without break or lunch  right low back Pain now about 8.10 recently but o/w not changed in character.  Has been quite stressful.  Denies worsening depressive symptoms, suicidal ideation, or panic.  Trying to follow lower chol diet ?      ?Wt Readings from Last 3 Encounters:  ?02/12/22 145 lb (65.8 kg)  ?07/02/21 145 lb (65.8 kg)  ?04/03/21 143 lb (64.9 kg)  ? ?BP Readings from Last 3 Encounters:  ?02/12/22 120/78  ?09/04/21 128/74  ?04/03/21 137/87  ? ?      ?Past Medical History:  ?Diagnosis Date  ? Allergic rhinitis   ? Anemia   ? NOS  ? Anxiety   ? Bronchiectasis ([place]) 01/05/2019  ? Depression   ? Fibromyalgia   ? Headache(784.0)   ? HNP (herniated nucleus pulposus), lumbar   ? L 2-3  ? Hyperlipidemia   ? Migraines   ? Osteopenia   ? Restless leg syndrome   ? STD (sexually transmitted disease)   ? HSV that outbreaks mid back  ? Unspecified vitamin D deficiency   ? ?Past Surgical History:  ?Procedure Laterality Date  ? AUGMENTATION MAMMAPLASTY Bilateral 5/01  ? saline  ? BACK SURGERY    ? BREAST SURGERY    ? COLONOSCOPY  10/2004  ? normal  ? SHOULDER SURGERY Right   ? SIGMOIDOSCOPY  2011  ? TUBAL LIGATION    ? bilat   ? ? reports that she has never smoked. She has never used smokeless tobacco. She reports current alcohol use of about 4.0 standard drinks per week. She reports that she does not use drugs. ?family history includes Depression in her mother and sister; Heart disease in her father and maternal grandfather; Hypertension in her maternal grandmother; Osteoarthritis in her mother; Osteoporosis in her maternal grandmother; Ovarian cancer (age of onset: 30) in her mother; Ulcers in her maternal grandmother. ?Allergies  ?Allergen Reactions  ? Clindamycin/Lincomycin Other (See Comments)  ?  C-Diff  ? ?Current Outpatient Medications on File Prior to Visit  ?Medication Sig Dispense Refill  ? Aspirin-Acetaminophen-Caffeine (GOODY HEADACHE PO) Take by mouth.    ? clonazePAM (KLONOPIN) 0.5 MG tablet TAKE 1 TABLET BY MOUTH TWICE A DAY AS NEEDED 60 tablet 5  ? cyclobenzaprine (FLEXERIL) 10 MG tablet TAKE 1 TABLET BY MOUTH AT BEDTIME. KEEP SCHEDULED APPT FOR FUTURE REFILLS 21 tablet 0  ? DULoxetine (CYMBALTA) 60 MG capsule Take 1 capsule (60 mg total) by mouth 2 (two) times daily. 180 capsule 3  ? FLUZONE HIGH-DOSE QUADRIVALENT 0.7 ML SUSY     ? Omega-3 Fatty Acids (OMEGA-3 FISH OIL PO)  Take by mouth.    ? PFIZER COVID-19 VAC BIVALENT injection     ? rosuvastatin (CRESTOR) 40 MG tablet TAKE 1 TABLET BY MOUTH EVERY DAY 90 tablet 3  ? SUMAtriptan (IMITREX) 100 MG tablet TAKE TABLETS BY MOUTH AS DIRECTED 12 tablet 9  ? traMADol (ULTRAM) 50 MG tablet TAKE 1 TABLET (50 MG TOTAL) BY MOUTH DAILY AS NEEDED. FOR PAIN 30 tablet 2  ? Turmeric (QC TUMERIC COMPLEX PO) Take by mouth.    ? valACYclovir (VALTREX) 1000 MG tablet TAKE 1 TABLET BY MOUTH TWICE A DAY 180 tablet 1  ? ?No current facility-administered medications on file prior to visit.  ? ?     ROS:  All others reviewed and negative. ? ?Objective  ? ?     PE:  BP 120/78 (BP Location: Left Arm, Patient Position: Sitting, Cuff Size: Large)   Pulse 83   Temp 99 ?F (37.2 ?C) (Oral)   Ht 5'  9" (1.753 m)   Wt 145 lb (65.8 kg)   LMP 07/13/2006 (Approximate)   SpO2 94%   BMI 21.41 kg/m?  ? ?              Constitutional: Pt appears in NAD ?              HENT: Head: NCAT.  ?              Right Ear: External ear normal.   ?              Left Ear: External ear normal.  ?              Eyes: . Pupils are equal, round, and reactive to light. Conjunctivae and EOM are normal ?              Nose: without d/c or deformity ?              Neck: Neck supple. Gross normal ROM ?              Cardiovascular: Normal rate and regular rhythm.   ?              Pulmonary/Chest: Effort normal and breath sounds without rales or wheezing.  ?              Abd:  Soft, NT, ND, + BS, no organomegaly ?              Neurological: Pt is alert. At baseline orientation, motor grossly intact ?              Skin: Skin is warm. No rashes, no other new lesions, LE edema - none ?              Psychiatric: Pt behavior is normal without agitation  ? ?Micro: none ? ?Cardiac tracings I have personally interpreted today:  ECG - NSR 82 ? ?Pertinent Radiological findings (summarize): none  ? ?Lab Results  ?Component Value Date  ? WBC 5.8 02/06/2021  ? HGB 12.0 02/06/2021  ? HCT 35.9 (L) 02/06/2021  ? PLT 249.0 02/06/2021  ? GLUCOSE 83 02/06/2021  ? CHOL 222 (H) 02/06/2021  ? TRIG 79.0 02/06/2021  ? HDL 75.40 02/06/2021  ? LDLDIRECT 133.2 09/14/2013  ? LDLCALC 131 (H) 02/06/2021  ? ALT 17 02/06/2021  ? AST 22 02/06/2021  ? NA 140 02/06/2021  ? K 5.2 No hemolysis seen (H) 02/06/2021  ? CL 105 02/06/2021  ? CREATININE 0.94 02/06/2021  ?  BUN 20 02/06/2021  ? CO2 29 02/06/2021  ? TSH 2.11 02/06/2021  ? INR 1.0 04/21/2018  ? ?Assessment/Plan:  ?Christina Waller is a 66 y.o. White or Caucasian [1] female with  has a past medical history of Allergic rhinitis, Anemia, Anxiety, Bronchiectasis ([place]) (01/05/2019), Depression, Fibromyalgia, Headache(784.0), HNP (herniated nucleus pulposus), lumbar, Hyperlipidemia, Migraines, Osteopenia, Restless leg syndrome,  STD (sexually transmitted disease), and Unspecified vitamin D deficiency. ? ?Lower back pain ?Uncontrolled worsening pain due to increase position stress, pt declines further pain control but is thinking of new job ? ?Intermittent palpitations ?Mild intermittent with dizziness; seems c/w PAC or PVC, ecg reviewed, now for echo, and cardiac event monitor r/o Afib, declines other lab today but has cpx labs soon ? ?HLD (hyperlipidemia) ?Lab Results  ?Component Value Date  ? [place] 131 (H) 02/06/2021  ? ?uncontrolled, pt to continue current statin crestor 40, low chol diet, declines lab today but will be lab with next visit ? ? ?Aortic atherosclerosis ([place]) ?Pt to continue low chol diet, exercise, crestor ? ?Followup: Return if symptoms worsen or fail to improve. ? ?Cathlean Cower, MD 02/14/2022 9:15 AM ?[facility] ?[place] ?Internal Medicine ?

## 2022-02-14 ENCOUNTER — Encounter: Payer: Self-pay | Admitting: Internal Medicine

## 2022-02-14 NOTE — Assessment & Plan Note (Addendum)
Mild intermittent with dizziness; seems c/w PAC or PVC, ecg reviewed, now for echo, and cardiac event monitor r/o Afib, declines other lab today but has cpx labs soon ?

## 2022-02-14 NOTE — Assessment & Plan Note (Signed)
Uncontrolled worsening pain due to increase position stress, pt declines further pain control but is thinking of new job ?

## 2022-02-14 NOTE — Assessment & Plan Note (Signed)
Lab Results  ?Component Value Date  ? [place] 131 (H) 02/06/2021  ? ?uncontrolled, pt to continue current statin crestor 40, low chol diet, declines lab today but will be lab with next visit ? ?

## 2022-02-14 NOTE — Assessment & Plan Note (Signed)
Pt to continue low chol diet, exercise, crestor °

## 2022-02-15 ENCOUNTER — Other Ambulatory Visit: Payer: Self-pay | Admitting: Internal Medicine

## 2022-02-16 NOTE — Telephone Encounter (Signed)
Spoke to patient in reference to her holter monitor. Patient will check Mychart for directions  ?

## 2022-02-23 ENCOUNTER — Ambulatory Visit (INDEPENDENT_AMBULATORY_CARE_PROVIDER_SITE_OTHER): Payer: PPO

## 2022-02-23 DIAGNOSIS — R002 Palpitations: Secondary | ICD-10-CM | POA: Diagnosis not present

## 2022-03-07 ENCOUNTER — Other Ambulatory Visit: Payer: Self-pay | Admitting: Internal Medicine

## 2022-03-10 ENCOUNTER — Other Ambulatory Visit (HOSPITAL_COMMUNITY): Payer: PPO

## 2022-03-11 ENCOUNTER — Encounter: Payer: PPO | Admitting: Internal Medicine

## 2022-03-12 ENCOUNTER — Ambulatory Visit (HOSPITAL_COMMUNITY): Payer: PPO | Attending: Internal Medicine

## 2022-03-12 DIAGNOSIS — E785 Hyperlipidemia, unspecified: Secondary | ICD-10-CM | POA: Insufficient documentation

## 2022-03-12 DIAGNOSIS — R9431 Abnormal electrocardiogram [ECG] [EKG]: Secondary | ICD-10-CM | POA: Diagnosis not present

## 2022-03-12 DIAGNOSIS — R002 Palpitations: Secondary | ICD-10-CM | POA: Diagnosis not present

## 2022-03-12 LAB — ECHOCARDIOGRAM COMPLETE
Area-P 1/2: 3.5 cm2
S' Lateral: 2.4 cm

## 2022-03-15 ENCOUNTER — Other Ambulatory Visit: Payer: Self-pay | Admitting: Internal Medicine

## 2022-03-16 ENCOUNTER — Other Ambulatory Visit: Payer: Self-pay | Admitting: Internal Medicine

## 2022-03-24 ENCOUNTER — Other Ambulatory Visit: Payer: Self-pay | Admitting: Internal Medicine

## 2022-03-24 MED ORDER — METOPROLOL TARTRATE 25 MG PO TABS: 12.5000 mg | ORAL_TABLET | Freq: Two times a day (BID) | ORAL | 3 refills | Status: DC | PRN

## 2022-04-02 ENCOUNTER — Ambulatory Visit (INDEPENDENT_AMBULATORY_CARE_PROVIDER_SITE_OTHER): Payer: PPO | Admitting: Internal Medicine

## 2022-04-02 ENCOUNTER — Encounter: Payer: Self-pay | Admitting: Internal Medicine

## 2022-04-02 VITALS — BP 112/68 | HR 66 | Temp 98.2°F | Ht 69.0 in | Wt 139.8 lb

## 2022-04-02 DIAGNOSIS — Z1231 Encounter for screening mammogram for malignant neoplasm of breast: Secondary | ICD-10-CM

## 2022-04-02 DIAGNOSIS — Z23 Encounter for immunization: Secondary | ICD-10-CM | POA: Diagnosis not present

## 2022-04-02 DIAGNOSIS — R002 Palpitations: Secondary | ICD-10-CM

## 2022-04-02 DIAGNOSIS — R739 Hyperglycemia, unspecified: Secondary | ICD-10-CM

## 2022-04-02 DIAGNOSIS — E559 Vitamin D deficiency, unspecified: Secondary | ICD-10-CM

## 2022-04-02 DIAGNOSIS — F411 Generalized anxiety disorder: Secondary | ICD-10-CM

## 2022-04-02 DIAGNOSIS — Z0000 Encounter for general adult medical examination without abnormal findings: Secondary | ICD-10-CM | POA: Diagnosis not present

## 2022-04-02 DIAGNOSIS — E538 Deficiency of other specified B group vitamins: Secondary | ICD-10-CM | POA: Diagnosis not present

## 2022-04-02 DIAGNOSIS — E7800 Pure hypercholesterolemia, unspecified: Secondary | ICD-10-CM

## 2022-04-02 DIAGNOSIS — R413 Other amnesia: Secondary | ICD-10-CM | POA: Diagnosis not present

## 2022-04-02 NOTE — Patient Instructions (Addendum)
You had the Pneumovax pneumonia shot today ? ?You will be contacted regarding the referral for: mammogram, and neurocognitive testing ? ?Please continue all other medications as before, and refills have been done if requested. ? ?Please have the pharmacy call with any other refills you may need. ? ?Please continue your efforts at being more active, low cholesterol diet, and weight control. ? ?You are otherwise up to date with prevention measures today. ? ?Please keep your appointments with your specialists as you may have planned ? ?Please make an Appointment to return for your 1 year visit, or sooner if needed, with Lab testing by Appointment as well, to be done about 3-5 days before at the [facility] (so this is for TWO appointments - please see the scheduling desk as you leave) ? ? ?Due to the ongoing Covid 19 pandemic, our lab now requires an appointment for any labs done at our office.  If you need labs done and do not have an appointment, please call our office ahead of time to schedule before presenting to the lab for your testing. ? ? ? ?

## 2022-04-02 NOTE — Progress Notes (Signed)
Patient ID: Christina Waller, female   DOB: [birth year], 65 y.o.   MRN: [id number] ? ? ? ?     Chief Complaint:: wellness exam and memory changes, anxiety, hyperglycemia, palpitations ? ?     HPI:  Christina Waller is a 66 y.o. female here for wellness exam; due for mammogram, declines shingrix, ok for pneumovax, o/w up to date ?         ?              Also Pt denies chest pain, increased sob or doe, wheezing, orthopnea, PND, increased LE swelling, dizziness or syncope, and palpitaitons much improved on BB after recent event monitor with SVT.  Denies worsening depressive symptoms, suicidal ideation, or panic; has ongoing anxiety, and also with mild memory changes recently, not sure how significaint.   Pt denies polydipsia, polyuria, or new focal neuro s/s.    Pt denies fever, wt loss, night sweats, loss of appetite, or other constitutional symptoms    ?  ?Wt Readings from Last 3 Encounters:  ?04/02/22 139 lb 12.8 oz (63.4 kg)  ?02/12/22 145 lb (65.8 kg)  ?07/02/21 145 lb (65.8 kg)  ? ?BP Readings from Last 3 Encounters:  ?04/02/22 112/68  ?02/12/22 120/78  ?09/04/21 128/74  ? ?Immunization History  ?Administered Date(s) Administered  ? Fluad Quad(high Dose 65+) 10/18/2020  ? Influenza Inj Mdck Quad Pf 11/15/2018  ? Influenza Split 09/03/2011  ? Influenza,inj,Quad PF,6+ Mos 09/14/2013, 09/13/2014, 10/23/2015, 11/09/2016, 09/16/2017, 11/22/2019  ? PFIZER(Purple Top)SARS-COV-2 Vaccination 12/28/2019, 01/18/2020, 10/09/2020  ? PPD Test 09/04/2012  ? Pension scheme manager 65yr & up 11/13/2021  ? Pneumococcal Conjugate-13 02/06/2021  ? Pneumococcal Polysaccharide-23 04/02/2022  ? Td 03/16/2006  ? Tdap 11/09/2016  ? ?Health Maintenance Due  ?Topic Date Due  ? MAMMOGRAM  09/12/2021  ? ?  ? ?Past Medical History:  ?Diagnosis Date  ? Allergic rhinitis   ? Anemia   ? NOS  ? Anxiety   ? Bronchiectasis ([place] 01/05/2019  ? Depression   ? Fibromyalgia   ? Headache(784.0)   ? HNP (herniated nucleus pulposus), lumbar   ?  L 2-3  ? Hyperlipidemia   ? Migraines   ? Osteopenia   ? Restless leg syndrome   ? STD (sexually transmitted disease)   ? HSV that outbreaks mid back  ? Unspecified vitamin D deficiency   ? ?Past Surgical History:  ?Procedure Laterality Date  ? AUGMENTATION MAMMAPLASTY Bilateral 5/01  ? saline  ? BACK SURGERY    ? BREAST SURGERY    ? COLONOSCOPY  10/2004  ? normal  ? SHOULDER SURGERY Right   ? SIGMOIDOSCOPY  2011  ? TUBAL LIGATION    ? bilat  ? ? reports that she has never smoked. She has never used smokeless tobacco. She reports current alcohol use of about 4.0 standard drinks per week. She reports that she does not use drugs. ?family history includes Depression in her mother and sister; Heart disease in her father and maternal grandfather; Hypertension in her maternal grandmother; Osteoarthritis in her mother; Osteoporosis in her maternal grandmother; Ovarian cancer (age of onset: 831 in her mother; Ulcers in her maternal grandmother. ?Allergies  ?Allergen Reactions  ? Clindamycin/Lincomycin Other (See Comments)  ?  C-Diff  ? ?Current Outpatient Medications on File Prior to Visit  ?Medication Sig Dispense Refill  ? Aspirin-Acetaminophen-Caffeine (GOODY HEADACHE PO) Take by mouth.    ? clonazePAM (KLONOPIN) 0.5 MG tablet TAKE 1 TABLET BY MOUTH TWICE  A DAY AS NEEDED 60 tablet 5  ? cyclobenzaprine (FLEXERIL) 10 MG tablet TAKE 1 TABLET BY MOUTH AT BEDTIME. KEEP SCHEDULED APPT FOR FUTURE REFILLS 21 tablet 0  ? DULoxetine (CYMBALTA) 60 MG capsule TAKE 1 CAPSULE BY MOUTH 2 TIMES DAILY. 180 capsule 3  ? ibuprofen (ADVIL) 800 MG tablet Take 800 mg by mouth 3 (three) times daily.    ? metoprolol tartrate (LOPRESSOR) 25 MG tablet Take 0.5 tablets (12.5 mg total) by mouth 2 (two) times daily as needed. 90 tablet 3  ? Omega-3 Fatty Acids (OMEGA-3 FISH OIL PO) Take by mouth.    ? rosuvastatin (CRESTOR) 40 MG tablet TAKE 1 TABLET BY MOUTH EVERY DAY 90 tablet 3  ? SUMAtriptan (IMITREX) 100 MG tablet TAKE TABLETS BY MOUTH AS  DIRECTED 12 tablet 9  ? traMADol (ULTRAM) 50 MG tablet TAKE 1 TABLET (50 MG TOTAL) BY MOUTH DAILY AS NEEDED. FOR PAIN 30 tablet 2  ? Turmeric (QC TUMERIC COMPLEX PO) Take by mouth.    ? valACYclovir (VALTREX) 1000 MG tablet TAKE 1 TABLET BY MOUTH TWICE A DAY 180 tablet 1  ? ?No current facility-administered medications on file prior to visit.  ? ?     ROS:  All others reviewed and negative. ? ?Objective  ? ?     PE:  BP 112/68 (BP Location: Right Arm, Patient Position: Sitting, Cuff Size: Normal)   Pulse 66   Temp 98.2 ?F (36.8 ?C) (Oral)   Ht '5\' 9"'$  (1.753 m)   Wt 139 lb 12.8 oz (63.4 kg)   LMP 07/13/2006 (Approximate)   SpO2 94%   BMI 20.64 kg/m?  ? ?              Constitutional: Pt appears in NAD ?              HENT: Head: NCAT.  ?              Right Ear: External ear normal.   ?              Left Ear: External ear normal.  ?              Eyes: . Pupils are equal, round, and reactive to light. Conjunctivae and EOM are normal ?              Nose: without d/c or deformity ?              Neck: Neck supple. Gross normal ROM ?              Cardiovascular: Normal rate and regular rhythm.   ?              Pulmonary/Chest: Effort normal and breath sounds without rales or wheezing.  ?              Abd:  Soft, NT, ND, + BS, no organomegaly ?              Neurological: Pt is alert. At baseline orientation, motor grossly intact ?              Skin: Skin is warm. No rashes, no other new lesions, LE edema - none ?              Psychiatric: Pt behavior is normal without agitation  ? ?Micro: none ? ?Cardiac tracings I have personally interpreted today:  none ? ?Pertinent Radiological findings (summarize): none  ? ?Lab Results  ?Component Value  Date  ? WBC 9.7 02/12/2022  ? HGB 11.6 (L) 02/12/2022  ? HCT 34.2 (L) 02/12/2022  ? PLT 213.0 02/12/2022  ? GLUCOSE 120 (H) 02/12/2022  ? CHOL 231 (H) 02/12/2022  ? TRIG 141.0 02/12/2022  ? HDL 73.30 02/12/2022  ? LDLDIRECT 133.2 09/14/2013  ? LDLCALC 129 (H) 02/12/2022  ? ALT 14  02/12/2022  ? AST 17 02/12/2022  ? NA 137 02/12/2022  ? K 4.3 02/12/2022  ? CL 102 02/12/2022  ? CREATININE 0.97 02/12/2022  ? BUN 23 02/12/2022  ? CO2 28 02/12/2022  ? TSH 2.94 02/12/2022  ? INR 1.0 04/21/2018  ? HGBA1C 5.6 02/12/2022  ? ?Assessment/Plan:  ?Christina Waller is a 66 y.o. White or Caucasian [1] female with  has a past medical history of Allergic rhinitis, Anemia, Anxiety, Bronchiectasis ([place]) (01/05/2019), Depression, Fibromyalgia, Headache(784.0), HNP (herniated nucleus pulposus), lumbar, Hyperlipidemia, Migraines, Osteopenia, Restless leg syndrome, STD (sexually transmitted disease), and Unspecified vitamin D deficiency. ? ?Preventative health care ?Age and sex appropriate education and counseling updated with regular exercise and diet ?Referrals for preventative services - for mammogram ?Immunizations addressed - for pneumovax, declines shingrix ?Smoking counseling  - none needed ?Evidence for depression or other mood disorder - chronic anxiety no change ?Most recent labs reviewed. ?I have personally reviewed and have noted: ?1) the patient's medical and social history ?2) The patient's current medications and supplements ?3) The patient's height, weight, and BMI have been recorded in the chart ? ? ?Intermittent palpitations ?Improved with BB ? ?Hyperglycemia ?Lab Results  ?Component Value Date  ? HGBA1C 5.6 02/12/2022  ? ?Stable, pt to continue current medical treatment  - diet ? ? ?Anxiety state ?Stable, cont current med tx ? ?Memory changes ?Also for neurocognitive referral ? ?Followup: Return in about 1 year (around 04/03/2023). ? ?Cathlean Cower, MD 04/06/2022 7:42 PM ?[facility] ?[place] ?Internal Medicine ?

## 2022-04-06 ENCOUNTER — Encounter: Payer: Self-pay | Admitting: Internal Medicine

## 2022-04-06 DIAGNOSIS — R739 Hyperglycemia, unspecified: Secondary | ICD-10-CM | POA: Insufficient documentation

## 2022-04-06 DIAGNOSIS — R413 Other amnesia: Secondary | ICD-10-CM | POA: Insufficient documentation

## 2022-04-06 NOTE — Assessment & Plan Note (Signed)
Stable, cont current med tx ?

## 2022-04-06 NOTE — Assessment & Plan Note (Signed)
Age and sex appropriate education and counseling updated with regular exercise and diet ?Referrals for preventative services - for mammogram ?Immunizations addressed - for pneumovax, declines shingrix ?Smoking counseling  - none needed ?Evidence for depression or other mood disorder - chronic anxiety no change ?Most recent labs reviewed. ?I have personally reviewed and have noted: ?1) the patient's medical and social history ?2) The patient's current medications and supplements ?3) The patient's height, weight, and BMI have been recorded in the chart ? ?

## 2022-04-06 NOTE — Assessment & Plan Note (Signed)
Lab Results  ?Component Value Date  ? HGBA1C 5.6 02/12/2022  ? ?Stable, pt to continue current medical treatment  - diet ? ?

## 2022-04-06 NOTE — Assessment & Plan Note (Signed)
Also for neurocognitive referral ?

## 2022-04-06 NOTE — Assessment & Plan Note (Signed)
Improved with BB ?

## 2022-04-12 ENCOUNTER — Other Ambulatory Visit: Payer: Self-pay | Admitting: Internal Medicine

## 2022-04-13 ENCOUNTER — Other Ambulatory Visit: Payer: Self-pay | Admitting: Internal Medicine

## 2022-04-13 DIAGNOSIS — R413 Other amnesia: Secondary | ICD-10-CM

## 2022-04-23 ENCOUNTER — Other Ambulatory Visit: Payer: Self-pay | Admitting: Internal Medicine

## 2022-05-17 ENCOUNTER — Other Ambulatory Visit: Payer: Self-pay | Admitting: Internal Medicine

## 2022-06-10 ENCOUNTER — Ambulatory Visit: Payer: PPO | Admitting: Psychiatry

## 2022-06-21 ENCOUNTER — Other Ambulatory Visit: Payer: Self-pay | Admitting: Internal Medicine

## 2022-06-24 ENCOUNTER — Other Ambulatory Visit: Payer: Self-pay | Admitting: Internal Medicine

## 2022-06-24 NOTE — Telephone Encounter (Signed)
Please refill as per office routine med refill policy (all routine meds to be refilled for 3 mo or monthly (per pt preference) up to one year from last visit, then month to month grace period for 3 mo, then further med refills will have to be denied) ? ?

## 2022-07-08 ENCOUNTER — Encounter: Payer: Self-pay | Admitting: Neurology

## 2022-07-08 ENCOUNTER — Ambulatory Visit: Payer: PPO | Admitting: Neurology

## 2022-07-08 ENCOUNTER — Other Ambulatory Visit: Payer: Self-pay | Admitting: Neurology

## 2022-07-08 VITALS — BP 97/60 | HR 67 | Ht 69.0 in | Wt 142.5 lb

## 2022-07-08 DIAGNOSIS — R413 Other amnesia: Secondary | ICD-10-CM | POA: Diagnosis not present

## 2022-07-08 DIAGNOSIS — G43009 Migraine without aura, not intractable, without status migrainosus: Secondary | ICD-10-CM

## 2022-07-08 DIAGNOSIS — E538 Deficiency of other specified B group vitamins: Secondary | ICD-10-CM

## 2022-07-08 MED ORDER — VITAMIN B-12 1000 MCG PO TABS: 1000.0000 ug | ORAL_TABLET | Freq: Every day | ORAL | 4 refills | Status: AC

## 2022-07-08 MED ORDER — NURTEC 75 MG PO TBDP: 75.0000 mg | ORAL_TABLET | ORAL | 12 refills | Status: DC

## 2022-07-08 NOTE — Progress Notes (Signed)
[facility]  PATIENT: Christina Waller DOB: April 12, 1956  REQUESTING CLINICIAN: Biagio Borg, MD HISTORY FROM: Patient  REASON FOR VISIT: Memory deficit    HISTORICAL  CHIEF COMPLAINT:  Chief Complaint  Patient presents with   New Patient (Initial Visit)    Rm 12. Alone. NP/internal referral for memory changes.    HISTORY OF PRESENT ILLNESS:  This is a 66 year old woman past medical history of CAD, hyperlipidemia, anxiety, depression, and fibromyalgia who is presenting with memory complaint.  Patient described the memory problem as being forgetful, she reports event where she put eggs on the stove and forgot them until they break.  She does report a history of forgetting names of celebrities or but does not have issue with names of family members.  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  She still drives denies being lost in familiar places, but reported 1 episode of confusion about which grocery store she was in.  She still pays her bills, she lives alone and is very independent. She reported her mother was diagnosed with dementia at the age of 65.  She also has a history of migraine headaches, currently not on any preventive headaches but gets more than 10 headaches a month. She is on Imitrex as migraine abortive. In the past, she has tried Topiramate, Duloxetine, Gabapentin.     TBI:   No past history of TBI Stroke:   no past history of stroke Seizures:   no past history of seizures Sleep:   no history of sleep apnea.   Mood:  Yes, anxiety and depression  Functional status: independent in all ADLs and IADLs Patient lives alone. Cooking: Yes  Cleaning: Yes  Shopping: doing ok  Bathing: no help needed  Toileting: no help needed  Driving: Yes, no recent accident  Bills: Yes, pay them on time   Ever left the stove on by accident?: Yes  Forget how to use items around the house?: No  Getting lost going to  familiar places?: Yes Forgetting loved ones names?: No Word finding difficulty? Yes  Sleep: Good    OTHER MEDICAL CONDITIONS: CAD, Hyperlipidemia, Fibromyalgia, anxiety and depression    REVIEW OF SYSTEMS: Full 14 system review of systems performed and negative with exception of: As noted in the HPI    ALLERGIES: Allergies  Allergen Reactions   Clindamycin/Lincomycin Other (See Comments)    C-Diff    HOME MEDICATIONS: Outpatient Medications Prior to Visit  Medication Sig Dispense Refill   Aspirin-Acetaminophen-Caffeine (GOODY HEADACHE PO) Take by mouth.     clonazePAM (KLONOPIN) 0.5 MG tablet TAKE 1 TABLET BY MOUTH TWICE A DAY AS NEEDED 60 tablet 5   cyclobenzaprine (FLEXERIL) 10 MG tablet TAKE 1 TABLET BY MOUTH AT BEDTIME. KEEP SCHEDULED APPT FOR FUTURE REFILLS (Patient taking differently: Take 10 mg by mouth as needed.) 21 tablet 0   DULoxetine (CYMBALTA) 60 MG capsule TAKE 1 CAPSULE BY MOUTH 2 TIMES DAILY. 180 capsule 3   ibuprofen (ADVIL) 800 MG tablet Take 800 mg by mouth 3 (three) times daily.     metoprolol tartrate (LOPRESSOR) 25 MG tablet Take 0.5 tablets (12.5 mg total) by mouth 2 (two) times daily as needed. 90 tablet 3   Omega-3 Fatty Acids (OMEGA-3 FISH OIL PO) Take by mouth.     rosuvastatin (CRESTOR) 40 MG tablet TAKE 1 TABLET BY MOUTH EVERY DAY 90 tablet 3   SUMAtriptan (IMITREX) 100 MG tablet TAKE  TABLETS BY MOUTH AS DIRECTED 12 tablet 9   traMADol (ULTRAM) 50 MG tablet TAKE 1 TABLET (50 MG TOTAL) BY MOUTH DAILY AS NEEDED. FOR PAIN 30 tablet 2   Turmeric (QC TUMERIC COMPLEX PO) Take by mouth.     valACYclovir (VALTREX) 1000 MG tablet TAKE 1 TABLET BY MOUTH TWICE A DAY 180 tablet 1   No facility-administered medications prior to visit.    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   Unspecified vitamin D deficiency     PAST SURGICAL HISTORY: Past Surgical History:  Procedure Laterality Date   AUGMENTATION MAMMAPLASTY Bilateral 5/01   saline   BACK SURGERY     BREAST SURGERY     COLONOSCOPY  10/2004   normal   SHOULDER SURGERY Right    SIGMOIDOSCOPY  2011   TUBAL LIGATION     bilat    FAMILY HISTORY: Family History  Problem Relation Age of Onset   Ovarian cancer Mother 2   Osteoarthritis Mother    Depression Mother    Heart disease Father    Depression Sister    Hypertension Maternal Grandmother    Osteoporosis Maternal Grandmother    Ulcers Maternal Grandmother    Heart disease Maternal Grandfather    Colon cancer Neg Hx    Colon polyps Neg Hx    Esophageal cancer Neg Hx    Rectal cancer Neg Hx    Stomach cancer Neg Hx     SOCIAL HISTORY: Social History   Socioeconomic History   Marital status: Divorced    Spouse name: Not on file   Number of children: 3   Years of education: Not on file   Highest education level: Not on file  Occupational History   Occupation: dental hygenist    Employer: [employer]  Tobacco Use   Smoking status: Never   Smokeless tobacco: Never  Vaping Use   Vaping Use: Never used  Substance and Sexual Activity   Alcohol use: Yes    Alcohol/week: 4.0 standard drinks of alcohol    Types: 4 Glasses of wine per week   Drug use: No   Sexual activity: Yes    Partners: Male    Birth control/protection: Surgical    Comment: BTL  Other Topics Concern   Not on file  Social History Narrative   Daily caffeine use 1 per day   Social Determinants of Health   Financial Resource Strain: Not on file  Food Insecurity: Not on file  Transportation Needs: Not on file  Physical Activity: Not on file  Stress: Not on file  Social Connections: Not on  file  Intimate Partner Violence: Not on file    PHYSICAL EXAM  GENERAL EXAM/CONSTITUTIONAL: Vitals:  Vitals:   07/08/22 0806  BP: 97/60  Pulse: 67  Weight: 142 lb 8 oz (64.6 kg)  Height: '5\' 9"'$  (1.753 m)   Body mass index is 21.04 kg/m. Wt Readings from Last 3 Encounters:  07/08/22 142 lb 8 oz (64.6 kg)  04/02/22 139 lb 12.8  oz (63.4 kg)  02/12/22 145 lb (65.8 kg)   Patient is in no distress; well developed, nourished and groomed; neck is supple  EYES: Pupils round and reactive to light, Visual fields full to confrontation, Extraocular movements intacts,   MUSCULOSKELETAL: Gait, strength, tone, movements noted in Neurologic exam below  NEUROLOGIC: MENTAL STATUS:      No data to display            07/08/2022    8:07 AM  Montreal Cognitive Assessment   Visuospatial/ Executive (0/5) 5  Naming (0/3) 3  Attention: Read list of digits (0/2) 2  Attention: Read list of letters (0/1) 1  Attention: Serial 7 subtraction starting at 100 (0/3) 3  Language: Repeat phrase (0/2) 2  Language : Fluency (0/1) 1  Abstraction (0/2) 2  Delayed Recall (0/5) 1  Orientation (0/6) 6  Total 26     CRANIAL NERVE:  2nd, 3rd, 4th, 6th - pupils equal and reactive to light, visual fields full to confrontation, extraocular muscles intact, no nystagmus 5th - facial sensation symmetric 7th - facial strength symmetric 8th - hearing intact 9th - palate elevates symmetrically, uvula midline 11th - shoulder shrug symmetric 12th - tongue protrusion midline  MOTOR:  normal bulk and tone  GAIT/STATION:  normal   DIAGNOSTIC DATA (LABS, IMAGING, TESTING) - I reviewed patient records, labs, notes, testing and imaging myself where available.  Lab Results  Component Value Date   WBC 9.7 02/12/2022   HGB 11.6 (L) 02/12/2022   HCT 34.2 (L) 02/12/2022   MCV 96.2 02/12/2022   PLT 213.0 02/12/2022      Component Value Date/Time   NA 137 02/12/2022 1611   K 4.3 02/12/2022 1611   CL  102 02/12/2022 1611   CO2 28 02/12/2022 1611   GLUCOSE 120 (H) 02/12/2022 1611   BUN 23 02/12/2022 1611   CREATININE 0.97 02/12/2022 1611   CALCIUM 9.3 02/12/2022 1611   PROT 6.8 02/12/2022 1611   ALBUMIN 4.1 02/12/2022 1611   AST 17 02/12/2022 1611   ALT 14 02/12/2022 1611   ALKPHOS 61 02/12/2022 1611   BILITOT 0.4 02/12/2022 1611   GFRNONAA 70.02 06/30/2010 1218   GFRAA 97 04/07/2007 0817   Lab Results  Component Value Date   CHOL 231 (H) 02/12/2022   HDL 73.30 02/12/2022   LDLCALC 129 (H) 02/12/2022   LDLDIRECT 133.2 09/14/2013   TRIG 141.0 02/12/2022   CHOLHDL 3 02/12/2022   Lab Results  Component Value Date   HGBA1C 5.6 02/12/2022   Lab Results  Component Value Date   VITAMINB12 371 02/12/2022   Lab Results  Component Value Date   TSH 2.94 02/12/2022       ASSESSMENT AND PLAN  66 y.o. year old female with migraine headaches, CAD, hyperlipidemia, anxiety, depression, and fibromyalgia who is presenting with memory complaint.  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  Otherwise she is independent in all ADLs and IADLs.  Her lab results show a vitamin B12 of 371, I will start her on B12 supplement, 1000 mcg daily.  She also has migraine headaches, currently she is on Imitrex as abortive but reported she used all of her 10 tablets before the month ends.  In the past she has tried topiramate, duloxetine, Neurontin as migraine prevention but currently she is  not on any preventive medication.  I will start her on Nurtec.  I will see her in 1 year for follow-up or sooner if worse.  She is comfortable with plans.   1. Memory problem   2. Migraine without aura and without status migrainosus, not intractable   3. Vitamin B 12 deficiency      Patient Instructions  Continue current medications  Start Vitamin B12  supplement 1000 mcg daily  Start with Nurtec every other day for migraine prevention Follow up with your PCP  Return in a year or sooner if worse    There are well-accepted and sensible ways to reduce risk for Alzheimers disease and other degenerative brain disorders .  Exercise Daily Walk A daily 20 minute walk should be part of your routine. Disease related apathy can be a significant roadblock to exercise and the only way to overcome this is to make it a daily routine and perhaps have a reward at the end (something your loved one loves to eat or drink perhaps) or a personal trainer coming to the home can also be very useful. Most importantly, the patient is much more likely to exercise if the caregiver / spouse does it with him/her. In general a structured, repetitive schedule is best.  General Health: Any diseases which effect your body will effect your brain such as a pneumonia, urinary infection, blood clot, heart attack or stroke. Keep contact with your primary care doctor for regular follow ups.  Sleep. A good nights sleep is healthy for the brain. Seven hours is recommended. If you have insomnia or poor sleep habits we can give you some instructions. If you have sleep apnea wear your mask.  Diet: Eating a heart healthy diet is also a good idea; fish and poultry instead of red meat, nuts (mostly non-peanuts), vegetables, fruits, olive oil or canola oil (instead of butter), minimal salt (use other spices to flavor foods), whole grain rice, bread, cereal and pasta and wine in moderation.Research is now showing that the MIND diet, which is a combination of The Mediterranean diet and the DASH diet, is beneficial for cognitive processing and longevity. Information about this diet can be found in The MIND Diet, a book by Doyne Keel, MS, RDN, and online at [employer]bookDist[employer].si  Finances, Power of Attorney and Advance Directives: You should consider putting legal safeguards  in place with regard to financial and medical decision making. While the spouse always has power of attorney for medical and financial issues in the absence of any form, you should consider what you want in case the spouse / caregiver is no longer around or capable of making decisions.    No orders of the defined types were placed in this encounter.   Meds ordered this encounter  Medications   cyanocobalamin (VITAMIN B12) 1000 MCG tablet    Sig: Take 1 tablet (1,000 mcg total) by mouth daily.    Dispense:  90 tablet    Refill:  4   Rimegepant Sulfate (NURTEC) 75 MG TBDP    Sig: Take 75 mg by mouth every other day.    Dispense:  16 tablet    Refill:  12    Return in about 1 year (around 07/09/2023).  I have spent a total of 60 minutes dedicated to this patient today, preparing to see patient, performing a medically appropriate examination and evaluation, ordering tests and/or medications and procedures, and counseling and educating the patient/family/caregiver; independently interpreting result and communicating results to the family/patient/caregiver;  and documenting clinical information in the electronic medical record.    Alric Ran, MD 07/08/2022, 11:28 AM  [facility] [address] [place], [place] [address] [phone]

## 2022-07-08 NOTE — Patient Instructions (Addendum)
Continue current medications  Start Vitamin B12 supplement 1000 mcg daily  Start with Nurtec every other day for migraine prevention Follow up with your PCP  Return in a year or sooner if worse    There are well-accepted and sensible ways to reduce risk for Alzheimers disease and other degenerative brain disorders .  Exercise Daily Walk A daily 20 minute walk should be part of your routine. Disease related apathy can be a significant roadblock to exercise and the only way to overcome this is to make it a daily routine and perhaps have a reward at the end (something your loved one loves to eat or drink perhaps) or a personal trainer coming to the home can also be very useful. Most importantly, the patient is much more likely to exercise if the caregiver / spouse does it with him/her. In general a structured, repetitive schedule is best.  General Health: Any diseases which effect your body will effect your brain such as a pneumonia, urinary infection, blood clot, heart attack or stroke. Keep contact with your primary care doctor for regular follow ups.  Sleep. A good nights sleep is healthy for the brain. Seven hours is recommended. If you have insomnia or poor sleep habits we can give you some instructions. If you have sleep apnea wear your mask.  Diet: Eating a heart healthy diet is also a good idea; fish and poultry instead of red meat, nuts (mostly non-peanuts), vegetables, fruits, olive oil or canola oil (instead of butter), minimal salt (use other spices to flavor foods), whole grain rice, bread, cereal and pasta and wine in moderation.Research is now showing that the MIND diet, which is a combination of The Mediterranean diet and the DASH diet, is beneficial for cognitive processing and longevity. Information about this diet can be found in The MIND Diet, a book by Doyne Keel, MS, RDN, and online at [employer]book[employer].si  Finances, Power of Attorney and Advance  Directives: You should consider putting legal safeguards in place with regard to financial and medical decision making. While the spouse always has power of attorney for medical and financial issues in the absence of any form, you should consider what you want in case the spouse / caregiver is no longer around or capable of making decisions.

## 2022-07-15 ENCOUNTER — Telehealth: Payer: Self-pay

## 2022-07-15 NOTE — Telephone Encounter (Signed)
A PA has been started on CMM for Nurtec 75 MG. Decision pending. (Key #: [id number])

## 2022-07-15 NOTE — Telephone Encounter (Signed)
PA Response - Approved PA Case ID: [id number]  I called to inform the patient.

## 2022-08-01 ENCOUNTER — Other Ambulatory Visit: Payer: Self-pay | Admitting: Internal Medicine

## 2022-08-19 ENCOUNTER — Other Ambulatory Visit: Payer: Self-pay | Admitting: Internal Medicine

## 2022-08-27 ENCOUNTER — Other Ambulatory Visit: Payer: Self-pay | Admitting: Internal Medicine

## 2022-08-27 MED ORDER — SUMATRIPTAN SUCCINATE 100 MG PO TABS: ORAL_TABLET | ORAL | 9 refills | Status: DC

## 2022-09-01 ENCOUNTER — Other Ambulatory Visit: Payer: Self-pay | Admitting: Internal Medicine

## 2022-09-10 ENCOUNTER — Other Ambulatory Visit: Payer: Self-pay | Admitting: Internal Medicine

## 2022-10-04 ENCOUNTER — Other Ambulatory Visit: Payer: Self-pay | Admitting: Internal Medicine

## 2022-10-04 ENCOUNTER — Ambulatory Visit (INDEPENDENT_AMBULATORY_CARE_PROVIDER_SITE_OTHER): Payer: PPO

## 2022-10-04 VITALS — Ht 69.0 in | Wt 145.0 lb

## 2022-10-04 DIAGNOSIS — Z0000 Encounter for general adult medical examination without abnormal findings: Secondary | ICD-10-CM

## 2022-10-04 DIAGNOSIS — Z13820 Encounter for screening for osteoporosis: Secondary | ICD-10-CM

## 2022-10-04 NOTE — Progress Notes (Signed)
Virtual Visit via Telephone Note  I connected with  Christina Waller on 10/04/22 at  3:00 PM EDT by telephone and verified that I am speaking with the correct person using two identifiers.  Location: Patient: Home  Provider: [facility] Persons participating in the virtual visit: [place]   I discussed the limitations, risks, security and privacy concerns of performing an evaluation and management service by telephone and the availability of in person appointments. The patient expressed understanding and agreed to proceed.  Interactive audio and video telecommunications were attempted between this nurse and patient, however failed, due to patient having technical difficulties OR patient did not have access to video capability.  We continued and completed visit with audio only.  Some vital signs may be absent or patient reported.   Sheral Flow, LPN  Subjective:   Christina Waller is a 66 y.o. female who presents for an Initial Medicare Annual Wellness Visit.  Review of Systems     Cardiac Risk Factors include: advanced age (>58mn, >>6women);dyslipidemia;family history of premature cardiovascular disease     Objective:    Today's Vitals   10/04/22 1503  Weight: 145 lb (65.8 kg)  Height: '5\' 9"'$  (1.753 m)  PainSc: 0-No pain   Body mass index is 21.41 kg/m.     10/04/2022    3:06 PM 12/25/2020    4:09 PM  Advanced Directives  Does Patient Have a Medical Advance Directive? No No  Would patient like information on creating a medical advance directive? Yes (MAU/Ambulatory/Procedural Areas - Information given) Yes (MAU/Ambulatory/Procedural Areas - Information given)    Current Medications (verified) Outpatient Encounter Medications as of 10/04/2022  Medication Sig   Aspirin-Acetaminophen-Caffeine (GOODY HEADACHE PO) Take by mouth.   clonazePAM (KLONOPIN) 0.5 MG tablet TAKE 1 TABLET BY MOUTH TWICE A DAY AS NEEDED   cyanocobalamin (VITAMIN B12)  1000 MCG tablet Take 1 tablet (1,000 mcg total) by mouth daily.   cyclobenzaprine (FLEXERIL) 10 MG tablet Take 1 tablet (10 mg total) by mouth at bedtime as needed.   DULoxetine (CYMBALTA) 60 MG capsule TAKE 1 CAPSULE BY MOUTH 2 TIMES DAILY.   ibuprofen (ADVIL) 800 MG tablet Take 800 mg by mouth 3 (three) times daily.   metoprolol tartrate (LOPRESSOR) 25 MG tablet Take 0.5 tablets (12.5 mg total) by mouth 2 (two) times daily as needed.   NURTEC 75 MG TBDP TAKE 1 TABLET BY MOUTH EVERY OTHER DAY   Omega-3 Fatty Acids (OMEGA-3 FISH OIL PO) Take by mouth.   rosuvastatin (CRESTOR) 40 MG tablet TAKE 1 TABLET BY MOUTH EVERY DAY   SUMAtriptan (IMITREX) 100 MG tablet TAKE 1 tab by mouth once daily as needed; may repeat at 2 hours for persistent pain   traMADol (ULTRAM) 50 MG tablet TAKE 1 TABLET (50 MG TOTAL) BY MOUTH DAILY AS NEEDED. FOR PAIN   Turmeric (QC TUMERIC COMPLEX PO) Take by mouth.   valACYclovir (VALTREX) 1000 MG tablet TAKE 1 TABLET BY MOUTH TWICE A DAY   No facility-administered encounter medications on file as of 10/04/2022.    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   Unspecified vitamin D deficiency    Past Surgical History:  Procedure Laterality Date   AUGMENTATION MAMMAPLASTY Bilateral 5/01   saline   BACK SURGERY     BREAST SURGERY     COLONOSCOPY  10/2004   normal   SHOULDER SURGERY Right    SIGMOIDOSCOPY  2011   TUBAL LIGATION     bilat   Family History  Problem Relation Age of Onset   Ovarian cancer Mother 43   Osteoarthritis Mother    Depression Mother    Heart disease Father     Depression Sister    Hypertension Maternal Grandmother    Osteoporosis Maternal Grandmother    Ulcers Maternal Grandmother    Heart disease Maternal Grandfather    Colon cancer Neg Hx    Colon polyps Neg Hx    Esophageal cancer Neg Hx    Rectal cancer Neg Hx    Stomach cancer Neg Hx    Social History   Socioeconomic History   Marital status: Divorced    Spouse name: Not on file   Number of children: 3   Years of education: Not on file   Highest education level: Not on file  Occupational History   Occupation: dental hygenist    Employer: [employer]  Tobacco Use   Smoking status: Never   Smokeless tobacco: Never  Vaping Use   Vaping Use: Never used  Substance and Sexual Activity   Alcohol use: Yes    Alcohol/week: 4.0 standard drinks of alcohol    Types: 4 Glasses of wine per week   Drug use: No   Sexual activity: Yes    Partners: Male    Birth control/protection: Surgical    Comment: BTL  Other Topics Concern   Not on file  Social History Narrative   Daily caffeine use 1 per day   Social Determinants of Health   Financial Resource Strain: Low Risk  (10/04/2022)   Overall Financial Resource Strain (CARDIA)    Difficulty of Paying Living Expenses: Not hard at all  Food Insecurity: No Food Insecurity (10/04/2022)   Hunger Vital Sign    Worried About Running Out of Food in the Last Year: Never true    Ran Out of Food in the Last Year: Never true  Transportation Needs: No Transportation Needs (10/04/2022)   PRAPARE - Hydrologist (Medical): No    Lack of Transportation (Non-Medical): No  Physical Activity: Sufficiently Active (10/04/2022)   Exercise Vital Sign    Days of Exercise per Week: 5 days    Minutes of Exercise per Session: 30 min  Stress: No Stress Concern Present (10/04/2022)   [name]    Feeling of Stress : Not at all  Social Connections: Moderately  Integrated (10/04/2022)   Social Connection and Isolation Panel [NHANES]    Frequency of Communication with Friends and Family: More than three times a week    Frequency of Social Gatherings with Friends and Family: More than three times a week    Attends Religious Services: More than 4 times per year    Active Member of [employer] or Organizations: Yes    Attends Music therapist: More than 4 times per year    Marital Status: Divorced    Tobacco Counseling Counseling given: Not Answered   Clinical  Intake:  Pre-visit preparation completed: Yes  Pain : No/denies pain Pain Score: 0-No pain     BMI - recorded: 21.41 Nutritional Status: BMI of 19-24  Normal Nutritional Risks: None Diabetes: No  How often do you need to have someone help you when you read instructions, pamphlets, or other written materials from your doctor or pharmacy?: 1 - Never What is the last grade level you completed in school?: HSG; Dental Hygiene Degree  Diabetic? no  Interpreter Needed?: No  Information entered by :: Lisette Abu, LPN.   Activities of Daily Living    10/04/2022    3:19 PM  In your present state of health, do you have any difficulty performing the following activities:  Hearing? 0  Vision? 0  Difficulty concentrating or making decisions? 0  Walking or climbing stairs? 0  Dressing or bathing? 0  Doing errands, shopping? 0  Preparing Food and eating ? N  Using the Toilet? N  In the past six months, have you accidently leaked urine? N  Do you have problems with loss of bowel control? N  Managing your Medications? N  Managing your Finances? N  Housekeeping or managing your Housekeeping? N    Patient Care Team: Biagio Borg, MD as PCP - General  Indicate any recent Medical Services you may have received from other than [facility] providers in the past year (date may be approximate).     Assessment:   This is a routine wellness examination for  Alonda.  Hearing/Vision screen Hearing Screening - Comments:: Denies hearing difficulties   Vision Screening - Comments:: Wears rx glasses - up to date with routine eye exams with Melissa Noon, OD.   Dietary issues and exercise activities discussed: Current Exercise Habits: Home exercise routine, Type of exercise: walking, Time (Minutes): 30, Frequency (Times/Week): 5, Weekly Exercise (Minutes/Week): 150, Intensity: Moderate, Exercise limited by: orthopedic condition(s);psychological condition(s);Other - see comments (Fatigue, Fibromyalgia, Right Hip Pain)   Goals Addressed             This Visit's Progress    My goal is to continue to exercise and increase my walking.        Depression Screen    10/04/2022    3:19 PM 04/02/2022   11:22 AM 04/02/2022   11:05 AM 02/06/2021   10:50 AM 02/06/2021   10:25 AM 02/01/2020   10:11 AM 01/05/2019    2:51 PM  PHQ 2/9 Scores  PHQ - 2 Score 0 0 0 1 0 0 0  PHQ- 9 Score      0     Fall Risk    10/04/2022    3:08 PM 04/02/2022   11:22 AM 04/02/2022   11:03 AM 02/06/2021   10:50 AM 02/06/2021   10:25 AM  Fall Risk   Falls in the past year? 1 0 0 1 1  Number falls in past yr: 0 0 0 0 0  Injury with Fall? 0 0 0 1 1  Risk for fall due to : No Fall Risks      Follow up Falls evaluation completed        Earth:  Any stairs in or around the home? Yes  If so, are there any without handrails? No  Home free of loose throw rugs in walkways, pet beds, electrical cords, etc? Yes  Adequate lighting in your home to reduce risk of falls? Yes   ASSISTIVE DEVICES UTILIZED TO PREVENT FALLS:  Life alert?  No  Use of a cane, walker or w/c? No  Grab bars in the bathroom? Yes  Shower chair or bench in shower? No  Elevated toilet seat or a handicapped toilet? Yes   TIMED UP AND GO:  Was the test performed? No . Phone Visit  Cognitive Function:      07/08/2022    8:07 AM  Montreal Cognitive Assessment    Visuospatial/ Executive (0/5) 5  Naming (0/3) 3  Attention: Read list of digits (0/2) 2  Attention: Read list of letters (0/1) 1  Attention: Serial 7 subtraction starting at 100 (0/3) 3  Language: Repeat phrase (0/2) 2  Language : Fluency (0/1) 1  Abstraction (0/2) 2  Delayed Recall (0/5) 1  Orientation (0/6) 6  Total 26      10/04/2022    3:21 PM  6CIT Screen  What Year? 0 points  What month? 0 points  What time? 0 points  Count back from 20 0 points  Months in reverse 0 points  Repeat phrase 0 points  Total Score 0 points    Immunizations Immunization History  Administered Date(s) Administered   Fluad Quad(high Dose 65+) 10/18/2020   Influenza Inj Mdck Quad Pf 11/15/2018   Influenza Split 09/03/2011   Influenza,inj,Quad PF,6+ Mos 09/14/2013, 09/13/2014, 10/23/2015, 11/09/2016, 09/16/2017, 11/22/2019   Influenza-Unspecified 09/08/2022   PFIZER Comirnaty(Gray Top)Covid-19 Tri-Sucrose Vaccine 09/08/2022   PFIZER(Purple Top)SARS-COV-2 Vaccination 12/28/2019, 01/18/2020, 10/09/2020   PPD Test 09/04/2012   Pfizer Covid-19 Vaccine Bivalent Booster 4yr & up 11/13/2021   Pneumococcal Conjugate-13 02/06/2021   Pneumococcal Polysaccharide-23 04/02/2022   Td 03/16/2006   Tdap 11/09/2016    TDAP status: Up to date  Flu Vaccine status: Up to date  Pneumococcal vaccine status: Up to date  Covid-19 vaccine status: Completed vaccines  Qualifies for Shingles Vaccine? Yes   Zostavax completed No   Shingrix Completed?: No.    Education has been provided regarding the importance of this vaccine. Patient has been advised to call insurance company to determine out of pocket expense if they have not yet received this vaccine. Advised may also receive vaccine at local pharmacy or Health Dept. Verbalized acceptance and understanding.  Screening Tests Health Maintenance  Topic Date Due   Zoster Vaccines- Shingrix (1 of 2) Never done   MAMMOGRAM  09/12/2021   COVID-19 Vaccine (5  - Pfizer series) 01/08/2023   COLONOSCOPY (Pts 45-435yrInsurance coverage will need to be confirmed)  04/03/2024   TETANUS/TDAP  11/09/2026   Pneumonia Vaccine 67Years old  Completed   INFLUENZA VACCINE  Completed   DEXA SCAN  Completed   Hepatitis C Screening  Completed   HPV VACCINES  Aged Out    Health Maintenance  Health Maintenance Due  Topic Date Due   Zoster Vaccines- Shingrix (1 of 2) Never done   MAMMOGRAM  09/12/2021    Colorectal cancer screening: Type of screening: Colonoscopy. Completed 04/03/2021. Repeat every 3 years  Mammogram status: Ordered 04/02/2022. Pt provided with contact info and advised to call to schedule appt.   Bone Density status: Ordered 10/04/2022. Pt provided with contact info and advised to call to schedule appt.  Lung Cancer Screening: (Low Dose CT Chest recommended if Age 67-80ears, 30 pack-year currently smoking OR have quit w/in 15years.) does not qualify.   Lung Cancer Screening Referral: no  Additional Screening:  Hepatitis C Screening: does qualify; Completed 10/23/2015  Vision Screening: Recommended annual ophthalmology exams for early detection of glaucoma and other disorders of  the eye. Is the patient up to date with their annual eye exam?  Yes  Who is the provider or what is the name of the office in which the patient attends annual eye exams? Melissa Noon, OD. If pt is not established with a provider, would they like to be referred to a provider to establish care? No .   Dental Screening: Recommended annual dental exams for proper oral hygiene  Community Resource Referral / Chronic Care Management: CRR required this visit?  No   CCM required this visit?  No      Plan:     I have personally reviewed and noted the following in the patient's chart:   Medical and social history Use of alcohol, tobacco or illicit drugs  Current medications and supplements including opioid prescriptions. Patient is not currently taking  opioid prescriptions. Functional ability and status Nutritional status Physical activity Advanced directives List of other physicians Hospitalizations, surgeries, and ER visits in previous 12 months Vitals Screenings to include cognitive, depression, and falls Referrals and appointments  In addition, I have reviewed and discussed with patient certain preventive protocols, quality metrics, and best practice recommendations. A written personalized care plan for preventive services as well as general preventive health recommendations were provided to patient.     Sheral Flow, LPN   04/88/8916   Nurse Notes: N/A

## 2022-10-04 NOTE — Patient Instructions (Signed)
Christina Waller , Thank you for taking time to come for your Medicare Wellness Visit. I appreciate your ongoing commitment to your health goals. Please review the following plan we discussed and let me know if I can assist you in the future.   These are the goals we discussed:  Goals      My goal is to continue to exercise and increase my walking.        This is a list of the screening recommended for you and due dates:  Health Maintenance  Topic Date Due   Zoster (Shingles) Vaccine (1 of 2) Never done   Mammogram  09/12/2021   COVID-19 Vaccine (5 - Pfizer series) 01/08/2023   Colon Cancer Screening  04/03/2024   Tetanus Vaccine  11/09/2026   Pneumonia Vaccine  Completed   Flu Shot  Completed   DEXA scan (bone density measurement)  Completed   Hepatitis C Screening: USPSTF Recommendation to screen - Ages 3-79 yo.  Completed   HPV Vaccine  Aged Out    Advanced directives: Advance directive discussed with you today. I have provided a copy for you to complete at home and have notarized. Once this is complete please bring a copy in to our office so we can scan it into your chart.  Conditions/risks identified: Yes  Next appointment: Follow up in one year for your annual wellness visit.   Preventive Care 49 Years and Older, Female Preventive care refers to lifestyle choices and visits with your health care provider that can promote health and wellness. What does preventive care include? A yearly physical exam. This is also called an annual well check. Dental exams once or twice a year. Routine eye exams. Ask your health care provider how often you should have your eyes checked. Personal lifestyle choices, including: Daily care of your teeth and gums. Regular physical activity. Eating a healthy diet. Avoiding tobacco and drug use. Limiting alcohol use. Practicing safe sex. Taking low-dose aspirin every day. Taking vitamin and mineral supplements as recommended by your health care  provider. What happens during an annual well check? The services and screenings done by your health care provider during your annual well check will depend on your age, overall health, lifestyle risk factors, and family history of disease. Counseling  Your health care provider may ask you questions about your: Alcohol use. Tobacco use. Drug use. Emotional well-being. Home and relationship well-being. Sexual activity. Eating habits. History of falls. Memory and ability to understand (cognition). Work and work Statistician. Reproductive health. Screening  You may have the following tests or measurements: Height, weight, and BMI. Blood pressure. Lipid and cholesterol levels. These may be checked every 5 years, or more frequently if you are over 22 years old. Skin check. Lung cancer screening. You may have this screening every year starting at age 21 if you have a 30-pack-year history of smoking and currently smoke or have quit within the past 15 years. Fecal occult blood test (FOBT) of the stool. You may have this test every year starting at age 9. Flexible sigmoidoscopy or colonoscopy. You may have a sigmoidoscopy every 5 years or a colonoscopy every 10 years starting at age 42. Hepatitis C blood test. Hepatitis B blood test. Sexually transmitted disease (STD) testing. Diabetes screening. This is done by checking your blood sugar (glucose) after you have not eaten for a while (fasting). You may have this done every 1-3 years. Bone density scan. This is done to screen for osteoporosis. You may have  this done starting at age 58. Mammogram. This may be done every 1-2 years. Talk to your health care provider about how often you should have regular mammograms. Talk with your health care provider about your test results, treatment options, and if necessary, the need for more tests. Vaccines  Your health care provider may recommend certain vaccines, such as: Influenza vaccine. This is  recommended every year. Tetanus, diphtheria, and acellular pertussis (Tdap, Td) vaccine. You may need a Td booster every 10 years. Zoster vaccine. You may need this after age 61. Pneumococcal 13-valent conjugate (PCV13) vaccine. One dose is recommended after age 23. Pneumococcal polysaccharide (PPSV23) vaccine. One dose is recommended after age 51. Talk to your health care provider about which screenings and vaccines you need and how often you need them. This information is not intended to replace advice given to you by your health care provider. Make sure you discuss any questions you have with your health care provider. Document Released: 12/26/2015 Document Revised: 08/18/2016 Document Reviewed: 09/30/2015 Elsevier Interactive Patient Education  2017 [place] Prevention in the Home Falls can cause injuries. They can happen to people of all ages. There are many things you can do to make your home safe and to help prevent falls. What can I do on the outside of my home? Regularly fix the edges of walkways and driveways and fix any cracks. Remove anything that might make you trip as you walk through a door, such as a raised step or threshold. Trim any bushes or trees on the path to your home. Use bright outdoor lighting. Clear any walking paths of anything that might make someone trip, such as rocks or tools. Regularly check to see if handrails are loose or broken. Make sure that both sides of any steps have handrails. Any raised decks and porches should have guardrails on the edges. Have any leaves, snow, or ice cleared regularly. Use sand or salt on walking paths during winter. Clean up any spills in your garage right away. This includes oil or grease spills. What can I do in the bathroom? Use night lights. Install grab bars by the toilet and in the tub and shower. Do not use towel bars as grab bars. Use non-skid mats or decals in the tub or shower. If you need to sit down in  the shower, use a plastic, non-slip stool. Keep the floor dry. Clean up any water that spills on the floor as soon as it happens. Remove soap buildup in the tub or shower regularly. Attach bath mats securely with double-sided non-slip rug tape. Do not have throw rugs and other things on the floor that can make you trip. What can I do in the bedroom? Use night lights. Make sure that you have a light by your bed that is easy to reach. Do not use any sheets or blankets that are too big for your bed. They should not hang down onto the floor. Have a firm chair that has side arms. You can use this for support while you get dressed. Do not have throw rugs and other things on the floor that can make you trip. What can I do in the kitchen? Clean up any spills right away. Avoid walking on wet floors. Keep items that you use a lot in easy-to-reach places. If you need to reach something above you, use a strong step stool that has a grab bar. Keep electrical cords out of the way. Do not use floor polish or  wax that makes floors slippery. If you must use wax, use non-skid floor wax. Do not have throw rugs and other things on the floor that can make you trip. What can I do with my stairs? Do not leave any items on the stairs. Make sure that there are handrails on both sides of the stairs and use them. Fix handrails that are broken or loose. Make sure that handrails are as long as the stairways. Check any carpeting to make sure that it is firmly attached to the stairs. Fix any carpet that is loose or worn. Avoid having throw rugs at the top or bottom of the stairs. If you do have throw rugs, attach them to the floor with carpet tape. Make sure that you have a light switch at the top of the stairs and the bottom of the stairs. If you do not have them, ask someone to add them for you. What else can I do to help prevent falls? Wear shoes that: Do not have high heels. Have rubber bottoms. Are comfortable  and fit you well. Are closed at the toe. Do not wear sandals. If you use a stepladder: Make sure that it is fully opened. Do not climb a closed stepladder. Make sure that both sides of the stepladder are locked into place. Ask someone to hold it for you, if possible. Clearly mark and make sure that you can see: Any grab bars or handrails. First and last steps. Where the edge of each step is. Use tools that help you move around (mobility aids) if they are needed. These include: Canes. Walkers. Scooters. Crutches. Turn on the lights when you go into a dark area. Replace any light bulbs as soon as they burn out. Set up your furniture so you have a clear path. Avoid moving your furniture around. If any of your floors are uneven, fix them. If there are any pets around you, be aware of where they are. Review your medicines with your doctor. Some medicines can make you feel dizzy. This can increase your chance of falling. Ask your doctor what other things that you can do to help prevent falls. This information is not intended to replace advice given to you by your health care provider. Make sure you discuss any questions you have with your health care provider. Document Released: 09/25/2009 Document Revised: 05/06/2016 Document Reviewed: 01/03/2015 Elsevier Interactive Patient Education  2017 [employer].

## 2022-10-14 DIAGNOSIS — H2513 Age-related nuclear cataract, bilateral: Secondary | ICD-10-CM | POA: Diagnosis not present

## 2022-10-14 DIAGNOSIS — H5213 Myopia, bilateral: Secondary | ICD-10-CM | POA: Diagnosis not present

## 2022-10-14 DIAGNOSIS — H43811 Vitreous degeneration, right eye: Secondary | ICD-10-CM | POA: Diagnosis not present

## 2022-12-17 ENCOUNTER — Other Ambulatory Visit: Payer: Self-pay | Admitting: Internal Medicine

## 2023-01-07 ENCOUNTER — Ambulatory Visit (INDEPENDENT_AMBULATORY_CARE_PROVIDER_SITE_OTHER): Payer: PPO

## 2023-01-07 ENCOUNTER — Ambulatory Visit (INDEPENDENT_AMBULATORY_CARE_PROVIDER_SITE_OTHER): Payer: PPO | Admitting: Internal Medicine

## 2023-01-07 VITALS — BP 120/66 | HR 79 | Temp 98.1°F | Ht 69.0 in | Wt 146.0 lb

## 2023-01-07 DIAGNOSIS — R051 Acute cough: Secondary | ICD-10-CM | POA: Diagnosis not present

## 2023-01-07 DIAGNOSIS — F32A Depression, unspecified: Secondary | ICD-10-CM | POA: Diagnosis not present

## 2023-01-07 DIAGNOSIS — R739 Hyperglycemia, unspecified: Secondary | ICD-10-CM

## 2023-01-07 DIAGNOSIS — J479 Bronchiectasis, uncomplicated: Secondary | ICD-10-CM | POA: Diagnosis not present

## 2023-01-07 MED ORDER — LEVOFLOXACIN 500 MG PO TABS: 500.0000 mg | ORAL_TABLET | Freq: Every day | ORAL | 0 refills | Status: AC

## 2023-01-07 MED ORDER — HYDROCODONE BIT-HOMATROP MBR 5-1.5 MG/5ML PO SOLN: 5.0000 mL | Freq: Four times a day (QID) | ORAL | 0 refills | Status: DC | PRN

## 2023-01-07 NOTE — Progress Notes (Unsigned)
Patient ID: Christina Waller, female   DOB: [birth year], 67 y.o.   MRN: [id number]        Chief Complaint: follow up prod cough x 3 days       HPI:  Christina Waller is a 67 y.o. female Here with acute onset mild to mod 2-3 days ST, HA, general weakness and malaise, with prod cough greenish sputum, but Pt denies chest pain, increased sob or doe, wheezing, orthopnea, PND, increased LE swelling, palpitations, dizziness or syncope.  Has hx of bronchiectasis recent onset, similar symptoms now as per initial episode.  Pt denies polydipsia, polyuria, or new focal neuro s/s.    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   Wt Readings from Last 3 Encounters:  01/07/23 146 lb (66.2 kg)  10/04/22 145 lb (65.8 kg)  07/08/22 142 lb 8 oz (64.6 kg)   BP Readings from Last 3 Encounters:  01/07/23 120/66  07/08/22 97/60  04/02/22 112/68         Past Medical History:  Diagnosis Date   Allergic rhinitis    Anemia    NOS   Anxiety    Bronchiectasis ([place]) 01/05/2019   Depression    Fibromyalgia    Headache(784.0)    HNP (herniated nucleus pulposus), lumbar    L 2-3   Hyperlipidemia    Migraines    Osteopenia    Restless leg syndrome    STD (sexually transmitted disease)    HSV that outbreaks mid back   Unspecified vitamin D deficiency    Past Surgical History:  Procedure Laterality Date   AUGMENTATION MAMMAPLASTY Bilateral 5/01   saline   BACK SURGERY     BREAST SURGERY     COLONOSCOPY  10/2004   normal   SHOULDER SURGERY Right    SIGMOIDOSCOPY  2011   TUBAL LIGATION     bilat    reports that she has never smoked. She has never used smokeless tobacco. She reports current alcohol use of about 4.0 standard drinks of alcohol per week. She reports that she does not use drugs. family history includes Depression in  her mother and sister; Heart disease in her father and maternal grandfather; Hypertension in her maternal grandmother; Osteoarthritis in her mother; Osteoporosis in her maternal grandmother; Ovarian cancer (age of onset: 59) in her mother; Ulcers in her maternal grandmother. Allergies  Allergen Reactions   Clindamycin/Lincomycin Other (See Comments)    C-Diff   Current Outpatient Medications on File Prior to Visit  Medication Sig Dispense Refill   Aspirin-Acetaminophen-Caffeine (GOODY HEADACHE PO) Take by mouth.     clonazePAM (KLONOPIN) 0.5 MG tablet TAKE 1 TABLET BY MOUTH TWICE A DAY AS NEEDED 60 tablet 5   cyanocobalamin (VITAMIN B12) 1000 MCG tablet Take 1 tablet (1,000 mcg total) by mouth daily. 90 tablet 4   cyclobenzaprine (FLEXERIL) 10 MG tablet TAKE 1 TABLET BY MOUTH AT BEDTIME AS NEEDED. 90 tablet 1   DULoxetine (CYMBALTA) 60 MG capsule TAKE 1 CAPSULE BY MOUTH TWICE A DAY 180 capsule 3   DULoxetine (CYMBALTA) 60 MG capsule Take 1 capsule by mouth 2 (two) times daily.     ibuprofen (ADVIL) 800 MG tablet Take 800 mg by mouth 3 (three) times daily.     metoprolol tartrate (LOPRESSOR) 25 MG tablet Take 0.5 tablets (12.5  mg total) by mouth 2 (two) times daily as needed. 90 tablet 3   Omega-3 Fatty Acids (OMEGA-3 FISH OIL PO) Take by mouth.     rosuvastatin (CRESTOR) 40 MG tablet TAKE 1 TABLET BY MOUTH EVERY DAY 90 tablet 3   SUMAtriptan (IMITREX) 100 MG tablet TAKE 1 tab by mouth once daily as needed; may repeat at 2 hours for persistent pain 12 tablet 9   traMADol (ULTRAM) 50 MG tablet TAKE 1 TABLET (50 MG TOTAL) BY MOUTH DAILY AS NEEDED. FOR PAIN 30 tablet 2   Turmeric (QC TUMERIC COMPLEX PO) Take by mouth.     valACYclovir (VALTREX) 1000 MG tablet TAKE 1 TABLET BY MOUTH TWICE A DAY 180 tablet 1   No current facility-administered medications on file prior to visit.        ROS:  All others reviewed and negative.  Objective        PE:  BP 120/66 (BP Location: Right Arm, Patient  Position: Sitting, Cuff Size: Normal)   Pulse 79   Temp 98.1 F (36.7 C) (Oral)   Ht '5\' 9"'$  (1.753 m)   Wt 146 lb (66.2 kg)   LMP 07/13/2006 (Approximate)   SpO2 95%   BMI 21.56 kg/m                 Constitutional: Pt appears in NAD               HENT: Head: NCAT.                Right Ear: External ear normal.                 Left Ear: External ear normal.                Eyes: . Pupils are equal, round, and reactive to light. Conjunctivae and EOM are normal               Nose: without d/c or deformity               Neck: Neck supple. Gross normal ROM               Cardiovascular: Normal rate and regular rhythm.                 Pulmonary/Chest: Effort normal and breath sounds decreased  without rales or wheezing.                Abd:  Soft, NT, ND, + BS, no organomegaly               Neurological: Pt is alert. At baseline orientation, motor grossly intact               Skin: Skin is warm. No rashes, no other new lesions, LE edema - none               Psychiatric: Pt behavior is normal without agitation   Micro: none  Cardiac tracings I have personally interpreted today:  declines ecg  Pertinent Radiological findings (summarize): none   Lab Results  Component Value Date   WBC 9.7 02/12/2022   HGB 11.6 (L) 02/12/2022   HCT 34.2 (L) 02/12/2022   PLT 213.0 02/12/2022   GLUCOSE 120 (H) 02/12/2022   CHOL 231 (H) 02/12/2022   TRIG 141.0 02/12/2022   HDL 73.30 02/12/2022   LDLDIRECT 133.2 09/14/2013   LDLCALC 129 (H) 02/12/2022   ALT 14 02/12/2022   AST 17 02/12/2022  NA 137 02/12/2022   K 4.3 02/12/2022   CL 102 02/12/2022   CREATININE 0.97 02/12/2022   BUN 23 02/12/2022   CO2 28 02/12/2022   TSH 2.94 02/12/2022   INR 1.0 04/21/2018   HGBA1C 5.6 02/12/2022   Assessment/Plan:  Christina Waller is a 67 y.o. White or Caucasian [1] female with  has a past medical history of Allergic rhinitis, Anemia, Anxiety, Bronchiectasis ([place]) (01/05/2019), Depression, Fibromyalgia,  Headache(784.0), HNP (herniated nucleus pulposus), lumbar, Hyperlipidemia, Migraines, Osteopenia, Restless leg syndrome, STD (sexually transmitted disease), and Unspecified vitamin D deficiency.  Dizziness With standing up quickly x 1, no recurrence, pt stopped her diovan hct, exam benign today,  cont to hold diovan hct, ECG reviewed, for lab including cbc, to f/u any worsening symptoms or concerns, consider echo  Cough With ss URI - Mild to mod, for levaquin 500 qd course, cough med prn, and cxr,  to f/u any worsening symptoms or concerns  Hyperglycemia Lab Results  Component Value Date   HGBA1C 5.6 02/12/2022   Stable, pt to continue current medical treatment  - diet, wt control   HTN (hypertension) Stable  - to continue hold diovan hct for now  Depression Stable overall, cotn current tx, declines need for counseling  Followup: Return if symptoms worsen or fail to improve.  Cathlean Cower, MD 01/10/2023 8:10 PM [facility]

## 2023-01-07 NOTE — Patient Instructions (Signed)
Please take all new medication as prescribed - the antibiotic, and cough medicine  Please continue all other medications as before, and refills have been done if requested.  Please have the pharmacy call with any other refills you may needed  Please keep your appointments with your specialists as you may have planned  Please go to the XRAY Department in the first floor for the x-ray testing  You will be contacted by phone if any changes need to be made immediately.  Otherwise, you will receive a letter about your results with an explanation, but please check with MyChart first.  Please remember to sign up for MyChart if you have not done so, as this will be important to you in the future with finding out test results, communicating by private email, and scheduling acute appointments online when needed.

## 2023-01-10 ENCOUNTER — Encounter: Payer: Self-pay | Admitting: Internal Medicine

## 2023-01-10 DIAGNOSIS — R42 Dizziness and giddiness: Secondary | ICD-10-CM | POA: Insufficient documentation

## 2023-01-10 DIAGNOSIS — I10 Essential (primary) hypertension: Secondary | ICD-10-CM | POA: Insufficient documentation

## 2023-01-10 NOTE — Assessment & Plan Note (Signed)
Stable  - to continue hold diovan hct for now

## 2023-01-10 NOTE — Addendum Note (Signed)
Addended by: Biagio Borg on: 01/10/2023 08:10 PM   Modules accepted: Level of Service

## 2023-01-10 NOTE — Assessment & Plan Note (Signed)
Stable overall, cotn current tx, declines need for counseling

## 2023-01-10 NOTE — Assessment & Plan Note (Addendum)
Lab Results  Component Value Date   HGBA1C 5.6 02/12/2022   Stable, pt to continue current medical treatment  - diet, wt control

## 2023-01-10 NOTE — Assessment & Plan Note (Addendum)
With standing up quickly x 1, no recurrence, pt stopped her diovan hct, exam benign today,  cont to hold diovan hct, ECG reviewed, for lab including cbc, to f/u any worsening symptoms or concerns, consider echo

## 2023-01-10 NOTE — Assessment & Plan Note (Addendum)
With ss URI - Mild to mod, for levaquin 500 qd course, cough med prn, and cxr,  to f/u any worsening symptoms or concerns

## 2023-01-11 MED ORDER — PROMETHAZINE-DM 6.25-15 MG/5ML PO SYRP: 5.0000 mL | ORAL_SOLUTION | Freq: Four times a day (QID) | ORAL | 0 refills | Status: DC | PRN

## 2023-01-11 NOTE — Telephone Encounter (Signed)
Hycodan Rx not available at pharmacy and would like something different sent in if possible?

## 2023-01-27 DIAGNOSIS — J020 Streptococcal pharyngitis: Secondary | ICD-10-CM | POA: Diagnosis not present

## 2023-01-27 DIAGNOSIS — Z03818 Encounter for observation for suspected exposure to other biological agents ruled out: Secondary | ICD-10-CM | POA: Diagnosis not present

## 2023-01-27 DIAGNOSIS — Z789 Other specified health status: Secondary | ICD-10-CM | POA: Diagnosis not present

## 2023-01-27 DIAGNOSIS — Z6821 Body mass index (BMI) 21.0-21.9, adult: Secondary | ICD-10-CM | POA: Diagnosis not present

## 2023-02-10 ENCOUNTER — Ambulatory Visit: Payer: PPO | Admitting: Orthopaedic Surgery

## 2023-03-12 ENCOUNTER — Other Ambulatory Visit: Payer: Self-pay | Admitting: Internal Medicine

## 2023-03-15 ENCOUNTER — Other Ambulatory Visit: Payer: Self-pay | Admitting: Internal Medicine

## 2023-04-01 ENCOUNTER — Other Ambulatory Visit: Payer: Self-pay | Admitting: Internal Medicine

## 2023-04-15 ENCOUNTER — Encounter: Payer: PPO | Admitting: Internal Medicine

## 2023-04-21 ENCOUNTER — Ambulatory Visit (INDEPENDENT_AMBULATORY_CARE_PROVIDER_SITE_OTHER): Payer: PPO | Admitting: Internal Medicine

## 2023-04-21 ENCOUNTER — Encounter: Payer: Self-pay | Admitting: Internal Medicine

## 2023-04-21 VITALS — BP 124/78 | HR 75 | Temp 98.8°F | Ht 69.0 in | Wt 149.0 lb

## 2023-04-21 DIAGNOSIS — E559 Vitamin D deficiency, unspecified: Secondary | ICD-10-CM

## 2023-04-21 DIAGNOSIS — S61531A Puncture wound without foreign body of right wrist, initial encounter: Secondary | ICD-10-CM

## 2023-04-21 DIAGNOSIS — E538 Deficiency of other specified B group vitamins: Secondary | ICD-10-CM

## 2023-04-21 DIAGNOSIS — M5431 Sciatica, right side: Secondary | ICD-10-CM

## 2023-04-21 DIAGNOSIS — I700 Atherosclerosis of aorta: Secondary | ICD-10-CM | POA: Diagnosis not present

## 2023-04-21 DIAGNOSIS — R739 Hyperglycemia, unspecified: Secondary | ICD-10-CM | POA: Diagnosis not present

## 2023-04-21 DIAGNOSIS — Z1231 Encounter for screening mammogram for malignant neoplasm of breast: Secondary | ICD-10-CM | POA: Diagnosis not present

## 2023-04-21 DIAGNOSIS — Z0001 Encounter for general adult medical examination with abnormal findings: Secondary | ICD-10-CM | POA: Diagnosis not present

## 2023-04-21 DIAGNOSIS — E7800 Pure hypercholesterolemia, unspecified: Secondary | ICD-10-CM

## 2023-04-21 DIAGNOSIS — W5501XA Bitten by cat, initial encounter: Secondary | ICD-10-CM | POA: Diagnosis not present

## 2023-04-21 LAB — HEPATIC FUNCTION PANEL
ALT: 18 U/L (ref 0–35)
AST: 23 U/L (ref 0–37)
Albumin: 4.2 g/dL (ref 3.5–5.2)
Alkaline Phosphatase: 62 U/L (ref 39–117)
Bilirubin, Direct: 0 mg/dL (ref 0.0–0.3)
Total Bilirubin: 0.4 mg/dL (ref 0.2–1.2)
Total Protein: 7 g/dL (ref 6.0–8.3)

## 2023-04-21 LAB — URINALYSIS, ROUTINE W REFLEX MICROSCOPIC
Bilirubin Urine: NEGATIVE
Hgb urine dipstick: NEGATIVE
Ketones, ur: NEGATIVE
Leukocytes,Ua: NEGATIVE
Nitrite: NEGATIVE
RBC / HPF: NONE SEEN (ref 0–?)
Specific Gravity, Urine: 1.015 (ref 1.000–1.030)
Total Protein, Urine: NEGATIVE
Urine Glucose: NEGATIVE
Urobilinogen, UA: 0.2 (ref 0.0–1.0)
pH: 6.5 (ref 5.0–8.0)

## 2023-04-21 LAB — LIPID PANEL
Cholesterol: 234 mg/dL — ABNORMAL HIGH (ref 0–200)
HDL: 87.2 mg/dL (ref >39.00)
LDL Cholesterol: 119 mg/dL — ABNORMAL HIGH (ref 0–99)
NonHDL: 147.18
Total CHOL/HDL Ratio: 3
Triglycerides: 142 mg/dL (ref 0.0–149.0)
VLDL: 28.4 mg/dL (ref 0.0–40.0)

## 2023-04-21 LAB — CBC WITH DIFFERENTIAL/PLATELET
Basophils Absolute: 0 10*3/uL (ref 0.0–0.1)
Basophils Relative: 0.6 % (ref 0.0–3.0)
Eosinophils Absolute: 0.2 10*3/uL (ref 0.0–0.7)
Eosinophils Relative: 2.3 % (ref 0.0–5.0)
HCT: 35.3 % — ABNORMAL LOW (ref 36.0–46.0)
Hemoglobin: 12.1 g/dL (ref 12.0–15.0)
Lymphocytes Relative: 25.7 % (ref 12.0–46.0)
Lymphs Abs: 1.9 10*3/uL (ref 0.7–4.0)
MCHC: 34.3 g/dL (ref 30.0–36.0)
MCV: 95.3 fl (ref 78.0–100.0)
Monocytes Absolute: 0.6 10*3/uL (ref 0.1–1.0)
Monocytes Relative: 8.5 % (ref 3.0–12.0)
Neutro Abs: 4.6 10*3/uL (ref 1.4–7.7)
Neutrophils Relative %: 62.9 % (ref 43.0–77.0)
Platelets: 256 10*3/uL (ref 150.0–400.0)
RBC: 3.7 Mil/uL — ABNORMAL LOW (ref 3.87–5.11)
RDW: 12.9 % (ref 11.5–15.5)
WBC: 7.3 10*3/uL (ref 4.0–10.5)

## 2023-04-21 LAB — BASIC METABOLIC PANEL
BUN: 24 mg/dL — ABNORMAL HIGH (ref 6–23)
CO2: 29 mEq/L (ref 19–32)
Calcium: 9.3 mg/dL (ref 8.4–10.5)
Chloride: 104 mEq/L (ref 96–112)
Creatinine, Ser: 1.11 mg/dL (ref 0.40–1.20)
GFR: 51.37 mL/min — ABNORMAL LOW (ref >60.00)
Glucose, Bld: 93 mg/dL (ref 70–99)
Potassium: 4.4 mEq/L (ref 3.5–5.1)
Sodium: 140 mEq/L (ref 135–145)

## 2023-04-21 LAB — HEMOGLOBIN A1C: Hgb A1c MFr Bld: 5.9 % (ref 4.6–6.5)

## 2023-04-21 LAB — TSH: TSH: 1.78 u[IU]/mL (ref 0.35–5.50)

## 2023-04-21 LAB — VITAMIN B12: Vitamin B-12: >1500 pg/mL — ABNORMAL HIGH (ref 211–911)

## 2023-04-21 LAB — VITAMIN D 25 HYDROXY (VIT D DEFICIENCY, FRACTURES): VITD: 33.17 ng/mL (ref 30.00–100.00)

## 2023-04-21 NOTE — Progress Notes (Signed)
The test results show that your current treatment is OK, as the tests are stable.  Please continue the same plan.  There is no other need for change of treatment or further evaluation based on these results, at this time.  thanks 

## 2023-04-21 NOTE — Patient Instructions (Addendum)
Please have your Shingrix (shingles) shots done at your local pharmacy.  You will be contacted regarding the referral for: mammogram  Please take all new medication as prescribed - the gabapentin 100 mg three times daily (and call for a higher dose in 1-2 wks if you take this ok without sedation and need higher dose)  Please take all new medication as prescribed - the augmentin antibiotic  Please continue all other medications as before, and refills have been done if requested.  Please have the pharmacy call with any other refills you may need.  Please continue your efforts at being more active, low cholesterol diet, and weight control.  You are otherwise up to date with prevention measures today.  Please keep your appointments with your specialists as you may have planned  Please go to the LAB at the blood drawing area for the tests to be done  You will be contacted by phone if any changes need to be made immediately.  Otherwise, you will receive a letter about your results with an explanation, but please check with MyChart first.  Please remember to sign up for MyChart if you have not done so, as this will be important to you in the future with finding out test results, communicating by private email, and scheduling acute appointments online when needed.  Please make an Appointment to return for your 1 year visit, or sooner if needed

## 2023-04-21 NOTE — Progress Notes (Signed)
Patient ID: LAPORSCHE ESPINO, female   DOB: [birth year], 67 y.o.   MRN: [id number]         Chief Complaint:: wellness exam and right sciatica, right arm catbite,  hypergycemia, arotic atherosclerosis, hld       HPI:  Christina Waller is a 67 y.o. female here for wellness exam; pt due for mammogram, for shingrix at pharmacy, declines covid booster, o/w up to date                        Also incidentally suffered cat bite with 2 puncture wounds to right post arm just prox to the wrist with swelling bruising today, but no redness, pus or drainage.  Also with mild 2 wks worsening right sciatica again x 2 wks, hoping for gabapentin and refer sport med as this helped before. .  Pt denies chest pain, increased sob or doe, wheezing, orthopnea, PND, increased LE swelling, palpitations, dizziness or syncope.   Pt denies polydipsia, polyuria, or new focal neuro s/s.      Wt Readings from Last 3 Encounters:  04/21/23 149 lb (67.6 kg)  01/07/23 146 lb (66.2 kg)  10/04/22 145 lb (65.8 kg)   BP Readings from Last 3 Encounters:  04/21/23 124/78  01/07/23 120/66  07/08/22 97/60   Immunization History  Administered Date(s) Administered   Fluad Quad(high Dose 65+) 10/18/2020   Influenza Inj Mdck Quad Pf 11/15/2018   Influenza Split 09/03/2011   Influenza, High Dose Seasonal PF 09/08/2022   Influenza,inj,Quad PF,6+ Mos 09/14/2013, 09/13/2014, 10/23/2015, 11/09/2016, 09/16/2017, 11/22/2019   Influenza-Unspecified 09/08/2022   PFIZER Comirnaty(Gray Top)Covid-19 Tri-Sucrose Vaccine 09/08/2022   PFIZER(Purple Top)SARS-COV-2 Vaccination 12/28/2019, 01/18/2020, 10/09/2020   PPD Test 09/04/2012   Pfizer Covid-19 Vaccine Bivalent Booster 28yrs & up 11/13/2021   Pneumococcal Conjugate-13 02/06/2021   Pneumococcal Polysaccharide-23 04/02/2022   Td 03/16/2006   Tdap 11/09/2016   Unspecified SARS-COV-2 Vaccination 09/08/2022   Health Maintenance Due  Topic Date Due   Zoster Vaccines- Shingrix (1 of 2) Never done    MAMMOGRAM  09/12/2021      Past Medical History:  Diagnosis Date   Allergic rhinitis    Anemia    NOS   Anxiety    Bronchiectasis ([facility]) 01/05/2019   Depression    Fibromyalgia    Headache(784.0)    HNP (herniated nucleus pulposus), lumbar    L 2-3   Hyperlipidemia    Migraines    Osteopenia    Restless leg syndrome    STD (sexually transmitted disease)    HSV that outbreaks mid back   Unspecified vitamin D deficiency    Past Surgical History:  Procedure Laterality Date   AUGMENTATION MAMMAPLASTY Bilateral 5/01   saline   BACK SURGERY     BREAST SURGERY     COLONOSCOPY  10/2004   normal   SHOULDER SURGERY Right    SIGMOIDOSCOPY  2011   TUBAL LIGATION     bilat    reports that she has never smoked. She has never used smokeless tobacco. She reports current alcohol use of about 4.0 standard drinks of alcohol per week. She reports that she does not use drugs. family history includes Depression in her mother and sister; Heart disease in her father and maternal grandfather; Hypertension in her maternal grandmother; Osteoarthritis in her mother; Osteoporosis in her maternal grandmother; Ovarian cancer (age of onset: 75) in her mother; Ulcers in her maternal grandmother. Allergies  Allergen Reactions   Clindamycin/Lincomycin  Other (See Comments)    C-Diff   Current Outpatient Medications on File Prior to Visit  Medication Sig Dispense Refill   Aspirin-Acetaminophen-Caffeine (GOODY HEADACHE PO) Take by mouth.     clonazePAM (KLONOPIN) 0.5 MG tablet TAKE 1 TABLET BY MOUTH TWICE A DAY AS NEEDED 60 tablet 5   cyanocobalamin (VITAMIN B12) 1000 MCG tablet Take 1 tablet (1,000 mcg total) by mouth daily. 90 tablet 4   cyclobenzaprine (FLEXERIL) 10 MG tablet TAKE 1 TABLET BY MOUTH AT BEDTIME AS NEEDED. 90 tablet 1   DULoxetine (CYMBALTA) 60 MG capsule TAKE 1 CAPSULE BY MOUTH TWICE A DAY 180 capsule 3   ibuprofen (ADVIL) 800 MG tablet Take 800 mg by mouth 3 (three) times daily.      metoprolol tartrate (LOPRESSOR) 25 MG tablet TAKE 0.5 TABLETS (12.5 MG TOTAL) BY MOUTH 2 (TWO) TIMES DAILY AS NEEDED. 90 tablet 3   Omega-3 Fatty Acids (OMEGA-3 FISH OIL PO) Take by mouth.     rosuvastatin (CRESTOR) 40 MG tablet TAKE 1 TABLET BY MOUTH EVERY DAY 90 tablet 3   SUMAtriptan (IMITREX) 100 MG tablet TAKE 1 tab by mouth once daily as needed; may repeat at 2 hours for persistent pain 12 tablet 9   Turmeric (QC TUMERIC COMPLEX PO) Take by mouth.     valACYclovir (VALTREX) 1000 MG tablet TAKE 1 TABLET BY MOUTH TWICE A DAY 180 tablet 1   No current facility-administered medications on file prior to visit.        ROS:  All others reviewed and negative.  Objective        PE:  BP 124/78 (BP Location: Right Arm, Patient Position: Sitting, Cuff Size: Normal)   Pulse 75   Temp 98.8 F (37.1 C) (Oral)   Ht 5\' 9"  (1.753 m)   Wt 149 lb (67.6 kg)   LMP 07/13/2006 (Approximate)   SpO2 98%   BMI 22.00 kg/m                 Constitutional: Pt appears in NAD               HENT: Head: NCAT.                Right Ear: External ear normal.                 Left Ear: External ear normal.                Eyes: . Pupils are equal, round, and reactive to light. Conjunctivae and EOM are normal               Nose: without d/c or deformity               Neck: Neck supple. Gross normal ROM               Cardiovascular: Normal rate and regular rhythm.                 Pulmonary/Chest: Effort normal and breath sounds without rales or wheezing.                Abd:  Soft, NT, ND, + BS, no organomegaly               Neurological: Pt is alert. At baseline orientation, motor grossly intact               Skin: Skin is warm. No rashes, no other new lesions, LE edema - none  Psychiatric: Pt behavior is normal without agitation   Micro: none  Cardiac tracings I have personally interpreted today:  none  Pertinent Radiological findings (summarize): none   Lab Results  Component Value Date   WBC  7.3 04/21/2023   HGB 12.1 04/21/2023   HCT 35.3 (L) 04/21/2023   PLT 256.0 04/21/2023   GLUCOSE 93 04/21/2023   CHOL 234 (H) 04/21/2023   TRIG 142.0 04/21/2023   HDL 87.20 04/21/2023   LDLDIRECT 133.2 09/14/2013   LDLCALC 119 (H) 04/21/2023   ALT 18 04/21/2023   AST 23 04/21/2023   NA 140 04/21/2023   K 4.4 04/21/2023   CL 104 04/21/2023   CREATININE 1.11 04/21/2023   BUN 24 (H) 04/21/2023   CO2 29 04/21/2023   TSH 1.78 04/21/2023   INR 1.0 04/21/2018   HGBA1C 5.9 04/21/2023   Assessment/Plan:  Christina Waller is a 67 y.o. White or Caucasian [1] female with  has a past medical history of Allergic rhinitis, Anemia, Anxiety, Bronchiectasis ([facility]) (01/05/2019), Depression, Fibromyalgia, Headache(784.0), HNP (herniated nucleus pulposus), lumbar, Hyperlipidemia, Migraines, Osteopenia, Restless leg syndrome, STD (sexually transmitted disease), and Unspecified vitamin D deficiency.  Encounter for well adult exam with abnormal findings Age and sex appropriate education and counseling updated with regular exercise and diet Referrals for preventative services - for mammogram referral Immunizations addressed - declines covid booster, for shingrix at pharmacy Smoking counseling  - none needed Evidence for depression or other mood disorder - none significant Most recent labs reviewed. I have personally reviewed and have noted: 1) the patient's medical and social history 2) The patient's current medications and supplements 3) The patient's height, weight, and BMI have been recorded in the chart   HLD (hyperlipidemia) Lab Results  Component Value Date   LDLCALC 119 (H) 04/21/2023   Uncontrolled, to , pt to continue current crestor 40 qd  - to take with good compliance and lower chol diet, declines add zetia   Aortic atherosclerosis ([facility]) Pt for contd crestor 40 qd, low chol diet, excercise  Hyperglycemia Lab Results  Component Value Date   HGBA1C 5.9 04/21/2023   Stable, pt to  continue current medical treatment  - diet, wt control   Cat bite Mild to mod, for antibx course  -  augmentin 1 bid,,  to f/u any worsening symptoms or concerns  Right sided sciatica With mod worsening symptoms, for gabapentin 100 tid, refer sport medicine  Followup: Return in about 1 year (around 04/20/2024).  Oliver Barre, MD 04/24/2023 7:49 PM [facility]

## 2023-04-22 ENCOUNTER — Other Ambulatory Visit: Payer: Self-pay | Admitting: Internal Medicine

## 2023-04-22 ENCOUNTER — Ambulatory Visit: Payer: PPO | Admitting: Sports Medicine

## 2023-04-22 MED ORDER — TRAMADOL HCL 50 MG PO TABS: 50.0000 mg | ORAL_TABLET | Freq: Every day | ORAL | 2 refills | Status: DC | PRN

## 2023-04-24 ENCOUNTER — Encounter: Payer: Self-pay | Admitting: Internal Medicine

## 2023-04-24 DIAGNOSIS — M5431 Sciatica, right side: Secondary | ICD-10-CM | POA: Insufficient documentation

## 2023-04-24 DIAGNOSIS — W5501XA Bitten by cat, initial encounter: Secondary | ICD-10-CM | POA: Insufficient documentation

## 2023-04-24 NOTE — Assessment & Plan Note (Signed)
Lab Results  Component Value Date   HGBA1C 5.9 04/21/2023   Stable, pt to continue current medical treatment  - diet, wt control

## 2023-04-24 NOTE — Assessment & Plan Note (Signed)
Mild to mod, for antibx course  -  augmentin 1 bid,,  to f/u any worsening symptoms or concerns

## 2023-04-24 NOTE — Assessment & Plan Note (Signed)
Age and sex appropriate education and counseling updated with regular exercise and diet Referrals for preventative services - for mammogram referral Immunizations addressed - declines covid booster, for shingrix at pharmacy Smoking counseling  - none needed Evidence for depression or other mood disorder - none significant Most recent labs reviewed. I have personally reviewed and have noted: 1) the patient's medical and social history 2) The patient's current medications and supplements 3) The patient's height, weight, and BMI have been recorded in the chart

## 2023-04-24 NOTE — Assessment & Plan Note (Signed)
With mod worsening symptoms, for gabapentin 100 tid, refer sport medicine

## 2023-04-24 NOTE — Assessment & Plan Note (Signed)
Pt for contd crestor 40 qd, low chol diet, excercise

## 2023-04-24 NOTE — Assessment & Plan Note (Signed)
Lab Results  Component Value Date   LDLCALC 119 (H) 04/21/2023   Uncontrolled, to , pt to continue current crestor 40 qd  - to take with good compliance and lower chol diet, declines add zetia

## 2023-05-05 DIAGNOSIS — M5416 Radiculopathy, lumbar region: Secondary | ICD-10-CM | POA: Diagnosis not present

## 2023-05-19 DIAGNOSIS — M5416 Radiculopathy, lumbar region: Secondary | ICD-10-CM | POA: Diagnosis not present

## 2023-05-19 DIAGNOSIS — M5126 Other intervertebral disc displacement, lumbar region: Secondary | ICD-10-CM | POA: Diagnosis not present

## 2023-06-02 DIAGNOSIS — M48061 Spinal stenosis, lumbar region without neurogenic claudication: Secondary | ICD-10-CM | POA: Diagnosis not present

## 2023-06-02 DIAGNOSIS — M47816 Spondylosis without myelopathy or radiculopathy, lumbar region: Secondary | ICD-10-CM | POA: Diagnosis not present

## 2023-06-10 ENCOUNTER — Ambulatory Visit: Payer: PPO

## 2023-06-15 ENCOUNTER — Other Ambulatory Visit: Payer: Self-pay

## 2023-06-15 ENCOUNTER — Other Ambulatory Visit: Payer: Self-pay | Admitting: Internal Medicine

## 2023-06-23 DIAGNOSIS — M5416 Radiculopathy, lumbar region: Secondary | ICD-10-CM | POA: Diagnosis not present

## 2023-07-14 ENCOUNTER — Ambulatory Visit: Payer: PPO | Admitting: Neurology

## 2023-07-14 DIAGNOSIS — M5416 Radiculopathy, lumbar region: Secondary | ICD-10-CM | POA: Diagnosis not present

## 2023-07-20 DIAGNOSIS — M5116 Intervertebral disc disorders with radiculopathy, lumbar region: Secondary | ICD-10-CM | POA: Diagnosis not present

## 2023-07-20 DIAGNOSIS — M5117 Intervertebral disc disorders with radiculopathy, lumbosacral region: Secondary | ICD-10-CM | POA: Diagnosis not present

## 2023-07-20 DIAGNOSIS — Z9889 Other specified postprocedural states: Secondary | ICD-10-CM | POA: Diagnosis not present

## 2023-07-20 DIAGNOSIS — M48062 Spinal stenosis, lumbar region with neurogenic claudication: Secondary | ICD-10-CM | POA: Diagnosis not present

## 2023-07-20 DIAGNOSIS — M48061 Spinal stenosis, lumbar region without neurogenic claudication: Secondary | ICD-10-CM | POA: Diagnosis not present

## 2023-08-12 ENCOUNTER — Other Ambulatory Visit: Payer: Self-pay

## 2023-08-12 ENCOUNTER — Other Ambulatory Visit: Payer: Self-pay | Admitting: Internal Medicine

## 2023-09-10 ENCOUNTER — Other Ambulatory Visit: Payer: Self-pay | Admitting: Internal Medicine

## 2023-09-22 ENCOUNTER — Ambulatory Visit: Payer: PPO | Admitting: Neurology

## 2023-09-29 ENCOUNTER — Ambulatory Visit: Payer: PPO | Admitting: Neurology

## 2023-09-30 ENCOUNTER — Ambulatory Visit (INDEPENDENT_AMBULATORY_CARE_PROVIDER_SITE_OTHER): Payer: PPO

## 2023-09-30 ENCOUNTER — Ambulatory Visit: Payer: PPO | Admitting: Internal Medicine

## 2023-09-30 VITALS — BP 122/76 | HR 82 | Temp 98.4°F | Ht 69.0 in | Wt 151.0 lb

## 2023-09-30 DIAGNOSIS — R059 Cough, unspecified: Secondary | ICD-10-CM | POA: Diagnosis not present

## 2023-09-30 DIAGNOSIS — J984 Other disorders of lung: Secondary | ICD-10-CM | POA: Diagnosis not present

## 2023-09-30 DIAGNOSIS — R911 Solitary pulmonary nodule: Secondary | ICD-10-CM | POA: Diagnosis not present

## 2023-09-30 DIAGNOSIS — F32A Depression, unspecified: Secondary | ICD-10-CM

## 2023-09-30 DIAGNOSIS — E7800 Pure hypercholesterolemia, unspecified: Secondary | ICD-10-CM

## 2023-09-30 DIAGNOSIS — C44712 Basal cell carcinoma of skin of right lower limb, including hip: Secondary | ICD-10-CM | POA: Insufficient documentation

## 2023-09-30 DIAGNOSIS — R739 Hyperglycemia, unspecified: Secondary | ICD-10-CM

## 2023-09-30 DIAGNOSIS — R051 Acute cough: Secondary | ICD-10-CM

## 2023-09-30 DIAGNOSIS — M5416 Radiculopathy, lumbar region: Secondary | ICD-10-CM | POA: Insufficient documentation

## 2023-09-30 MED ORDER — HYDROCODONE BIT-HOMATROP MBR 5-1.5 MG/5ML PO SOLN: 5.0000 mL | Freq: Four times a day (QID) | ORAL | 0 refills | Status: AC | PRN

## 2023-09-30 MED ORDER — LEVOFLOXACIN 500 MG PO TABS: 500.0000 mg | ORAL_TABLET | Freq: Every day | ORAL | 0 refills | Status: AC

## 2023-09-30 NOTE — Progress Notes (Unsigned)
Patient ID: Christina Waller, female   DOB: 12/31/1955, 67 y.o.   MRN: [id number]        Chief Complaint: follow up prod cough, hyperglycemia, hld, depression       HPI:  Christina Waller is a 67 y.o. female Here with acute onset mild to mod 2-3 days ST, HA, general weakness and malaise, with prod cough greenish sputum, but Pt denies chest pain, increased sob or doe, wheezing, orthopnea, PND, increased LE swelling, palpitations, dizziness or syncope.   Pt denies polydipsia, polyuria, or new focal neuro s/s.    Pt denies recent wt loss, night sweats, loss of appetite, or other constitutional symptoms   Covid neg.at home  Denies worsening depressive symptoms, suicidal ideation, or panic       Wt Readings from Last 3 Encounters:  09/30/23 151 lb (68.5 kg)  04/21/23 149 lb (67.6 kg)  01/07/23 146 lb (66.2 kg)   BP Readings from Last 3 Encounters:  09/30/23 122/76  04/21/23 124/78  01/07/23 120/66         Past Medical History:  Diagnosis Date   Allergic rhinitis    Anemia    NOS   Anxiety    Bronchiectasis (HCC) 01/05/2019   Depression    Fibromyalgia    Headache(784.0)    HNP (herniated nucleus pulposus), lumbar    L 2-3   Hyperlipidemia    Migraines    Osteopenia    Restless leg syndrome    STD (sexually transmitted disease)    HSV that outbreaks mid back   Unspecified vitamin D deficiency    Past Surgical History:  Procedure Laterality Date   AUGMENTATION MAMMAPLASTY Bilateral 5/01   saline   BACK SURGERY     BREAST SURGERY     COLONOSCOPY  10/2004   normal   SHOULDER SURGERY Right    SIGMOIDOSCOPY  2011   TUBAL LIGATION     bilat    reports that she has never smoked. She has never used smokeless tobacco. She reports current alcohol use of about 4.0 standard drinks of alcohol per week. She reports that she does not use drugs. family history includes Depression in her mother and sister; Heart disease in her father and maternal grandfather; Hypertension in her maternal  grandmother; Osteoarthritis in her mother; Osteoporosis in her maternal grandmother; Ovarian cancer (age of onset: 51) in her mother; Ulcers in her maternal grandmother. Allergies  Allergen Reactions   Clindamycin/Lincomycin Other (See Comments)    C-Diff   Current Outpatient Medications on File Prior to Visit  Medication Sig Dispense Refill   Aspirin-Acetaminophen-Caffeine (GOODY HEADACHE PO) Take by mouth.     clonazePAM (KLONOPIN) 0.5 MG tablet TAKE 1 TABLET BY MOUTH TWICE A DAY AS NEEDED 60 tablet 5   cyclobenzaprine (FLEXERIL) 10 MG tablet TAKE 1 TABLET BY MOUTH EVERY DAY AT BEDTIME AS NEEDED 90 tablet 1   DULoxetine (CYMBALTA) 60 MG capsule TAKE 1 CAPSULE BY MOUTH TWICE A DAY 180 capsule 3   ibuprofen (ADVIL) 800 MG tablet Take 800 mg by mouth 3 (three) times daily.     metoprolol tartrate (LOPRESSOR) 25 MG tablet TAKE 0.5 TABLETS (12.5 MG TOTAL) BY MOUTH 2 (TWO) TIMES DAILY AS NEEDED. 90 tablet 3   Omega-3 Fatty Acids (OMEGA-3 FISH OIL PO) Take by mouth.     rosuvastatin (CRESTOR) 40 MG tablet TAKE 1 TABLET BY MOUTH EVERY DAY 90 tablet 3   SUMAtriptan (IMITREX) 100 MG tablet TAKE AS DIRECTED 9 tablet  13   traMADol (ULTRAM) 50 MG tablet Take 1 tablet (50 mg total) by mouth daily as needed. for pain 30 tablet 2   Turmeric (QC TUMERIC COMPLEX PO) Take by mouth.     valACYclovir (VALTREX) 1000 MG tablet TAKE 1 TABLET BY MOUTH TWICE A DAY 180 tablet 1   No current facility-administered medications on file prior to visit.        ROS:  All others reviewed and negative.  Objective        PE:  BP 122/76 (BP Location: Right Arm, Patient Position: Sitting, Cuff Size: Normal)   Pulse 82   Temp 98.4 F (36.9 C) (Oral)   Ht 5\' 9"  (1.753 m)   Wt 151 lb (68.5 kg)   LMP 07/13/2006 (Approximate)   SpO2 99%   BMI 22.30 kg/m                 Constitutional: Pt appears in NAD               HENT: Head: NCAT.                Right Ear: External ear normal.                 Left Ear: External  ear normal.                Eyes: . Pupils are equal, round, and reactive to light. Conjunctivae and EOM are normal               Nose: without d/c or deformity               Neck: Neck supple. Gross normal ROM               Cardiovascular: Normal rate and regular rhythm.                 Pulmonary/Chest: Effort normal and breath sounds without rales or wheezing.                Abd:  Soft, NT, ND, + BS, no organomegaly               Neurological: Pt is alert. At baseline orientation, motor grossly intact               Skin: Skin is warm. No rashes, no other new lesions, LE edema - none               Psychiatric: Pt behavior is normal without agitation   Micro: none  Cardiac tracings I have personally interpreted today:  none  Pertinent Radiological findings (summarize): none   Lab Results  Component Value Date   WBC 7.3 04/21/2023   HGB 12.1 04/21/2023   HCT 35.3 (L) 04/21/2023   PLT 256.0 04/21/2023   GLUCOSE 93 04/21/2023   CHOL 234 (H) 04/21/2023   TRIG 142.0 04/21/2023   HDL 87.20 04/21/2023   LDLDIRECT 133.2 09/14/2013   LDLCALC 119 (H) 04/21/2023   ALT 18 04/21/2023   AST 23 04/21/2023   NA 140 04/21/2023   K 4.4 04/21/2023   CL 104 04/21/2023   CREATININE 1.11 04/21/2023   BUN 24 (H) 04/21/2023   CO2 29 04/21/2023   TSH 1.78 04/21/2023   INR 1.0 04/21/2018   HGBA1C 5.9 04/21/2023   Assessment/Plan:  [name] is a 67 y.o. White or Caucasian [1] female with  has a past medical history of Allergic rhinitis, Anemia, Anxiety, Bronchiectasis (  HCC) (01/05/2019), Depression, Fibromyalgia, Headache(784.0), HNP (herniated nucleus pulposus), lumbar, Hyperlipidemia, Migraines, Osteopenia, Restless leg syndrome, STD (sexually transmitted disease), and Unspecified vitamin D deficiency.  Hyperglycemia Lab Results  Component Value Date   HGBA1C 5.9 04/21/2023   Stable, pt to continue current medical treatment  - diet, wt control   HLD (hyperlipidemia) Lab Results   Component Value Date   LDLCALC 119 (H) 04/21/2023   Uncontrolled, pt to continue current statin crestor 40 every day with good compliance and lower chol diet, declines other change for now   Depression Stable, cont cymbalta 60 qd  Cough Mild to mod, c/w bronchitis vs pna, for cxr, for antibx course and cough med prn,,  to f/u any worsening symptoms or concerns  Followup: Return if symptoms worsen or fail to improve.  Oliver Barre, MD 10/04/2023 6:13 AM [facility]

## 2023-09-30 NOTE — Patient Instructions (Signed)
Your COVID testing is negative  Please take all new medication as prescribed -the antibiotic, cough medicine  Please continue all other medications as before, and refills have been done if requested.  Please have the pharmacy call with any other refills you may need.  Please keep your appointments with your specialists as you may have planned  Please go to the XRAY Department in the first floor for the x-ray testing  You will be contacted by phone if any changes need to be made immediately.  Otherwise, you will receive a letter about your results with an explanation, but please check with MyChart first.

## 2023-10-04 ENCOUNTER — Encounter: Payer: Self-pay | Admitting: Internal Medicine

## 2023-10-04 NOTE — Assessment & Plan Note (Signed)
Stable, cont cymbalta 60 qd

## 2023-10-04 NOTE — Assessment & Plan Note (Signed)
Lab Results  Component Value Date   HGBA1C 5.9 04/21/2023   Stable, pt to continue current medical treatment  - diet, wt control

## 2023-10-04 NOTE — Assessment & Plan Note (Addendum)
Lab Results  Component Value Date   LDLCALC 119 (H) 04/21/2023   Uncontrolled, pt to continue current statin crestor 40 every day with good compliance and lower chol diet, declines other change for now

## 2023-10-04 NOTE — Assessment & Plan Note (Signed)
Mild to mod, c/w bronchitis vs pna, for cxr, for antibx course and cough med prn,,  to f/u any worsening symptoms or concerns

## 2023-10-24 ENCOUNTER — Other Ambulatory Visit: Payer: Self-pay | Admitting: Internal Medicine

## 2023-10-24 DIAGNOSIS — R911 Solitary pulmonary nodule: Secondary | ICD-10-CM

## 2023-11-04 DIAGNOSIS — H5213 Myopia, bilateral: Secondary | ICD-10-CM | POA: Diagnosis not present

## 2023-11-04 DIAGNOSIS — H2513 Age-related nuclear cataract, bilateral: Secondary | ICD-10-CM | POA: Diagnosis not present

## 2023-11-04 DIAGNOSIS — H524 Presbyopia: Secondary | ICD-10-CM | POA: Diagnosis not present

## 2023-11-04 DIAGNOSIS — G43909 Migraine, unspecified, not intractable, without status migrainosus: Secondary | ICD-10-CM | POA: Diagnosis not present

## 2023-11-17 ENCOUNTER — Ambulatory Visit
Admission: RE | Admit: 2023-11-17 | Discharge: 2023-11-17 | Disposition: A | Discharge status: Home / Self Care | Payer: PPO | Source: Ambulatory Visit | Attending: Internal Medicine | Admitting: Internal Medicine

## 2023-11-17 DIAGNOSIS — J479 Bronchiectasis, uncomplicated: Secondary | ICD-10-CM | POA: Diagnosis not present

## 2023-11-17 DIAGNOSIS — I2510 Atherosclerotic heart disease of native coronary artery without angina pectoris: Secondary | ICD-10-CM | POA: Diagnosis not present

## 2023-11-17 DIAGNOSIS — Z9882 Breast implant status: Secondary | ICD-10-CM | POA: Diagnosis not present

## 2023-11-17 DIAGNOSIS — R911 Solitary pulmonary nodule: Secondary | ICD-10-CM

## 2023-11-29 ENCOUNTER — Other Ambulatory Visit: Payer: Self-pay | Admitting: Internal Medicine

## 2023-11-29 MED ORDER — EZETIMIBE 10 MG PO TABS: 10.0000 mg | ORAL_TABLET | Freq: Every day | ORAL | 3 refills | Status: DC

## 2023-12-11 ENCOUNTER — Other Ambulatory Visit: Payer: Self-pay | Admitting: Internal Medicine

## 2023-12-12 ENCOUNTER — Other Ambulatory Visit: Payer: Self-pay

## 2023-12-12 ENCOUNTER — Other Ambulatory Visit: Payer: Self-pay | Admitting: Internal Medicine

## 2023-12-12 DIAGNOSIS — Z1231 Encounter for screening mammogram for malignant neoplasm of breast: Secondary | ICD-10-CM

## 2023-12-13 ENCOUNTER — Other Ambulatory Visit: Payer: Self-pay | Admitting: Internal Medicine

## 2023-12-13 ENCOUNTER — Other Ambulatory Visit: Payer: Self-pay

## 2023-12-29 ENCOUNTER — Ambulatory Visit
Admission: RE | Admit: 2023-12-29 | Discharge: 2023-12-29 | Disposition: A | Discharge status: Home / Self Care | Payer: PPO | Source: Ambulatory Visit | Attending: Internal Medicine | Admitting: Internal Medicine

## 2023-12-29 DIAGNOSIS — Z1231 Encounter for screening mammogram for malignant neoplasm of breast: Secondary | ICD-10-CM

## 2024-01-03 ENCOUNTER — Other Ambulatory Visit: Payer: Self-pay | Admitting: Internal Medicine

## 2024-01-03 DIAGNOSIS — R921 Mammographic calcification found on diagnostic imaging of breast: Secondary | ICD-10-CM

## 2024-01-09 ENCOUNTER — Ambulatory Visit: Payer: PPO | Admitting: Neurology

## 2024-01-09 ENCOUNTER — Encounter: Payer: Self-pay | Admitting: Neurology

## 2024-01-09 VITALS — BP 122/76 | HR 72 | Ht 69.0 in | Wt 152.0 lb

## 2024-01-09 DIAGNOSIS — G43909 Migraine, unspecified, not intractable, without status migrainosus: Secondary | ICD-10-CM

## 2024-01-09 DIAGNOSIS — R419 Unspecified symptoms and signs involving cognitive functions and awareness: Secondary | ICD-10-CM

## 2024-01-09 MED ORDER — ZAVZPRET 10 MG/ACT NA SOLN: 10.0000 mg | NASAL | 2 refills | Status: DC | PRN

## 2024-01-09 MED ORDER — NURTEC 75 MG PO TBDP: 75.0000 mg | ORAL_TABLET | ORAL | 11 refills | Status: DC

## 2024-01-09 NOTE — Progress Notes (Signed)
[facility]  PATIENT: Christina Waller DOB: March 29, 1956  REQUESTING CLINICIAN: Corwin Levins, MD HISTORY FROM: Patient  REASON FOR VISIT: Memory deficit    HISTORICAL  CHIEF COMPLAINT:  Chief Complaint  Patient presents with   Follow-up    Pt in 13, here alone Pt is here for follow up on migraines and memory. Pt states she is having about 8 migraines a month. Pt states regarding her memory states she feels she is being more forgetful more than before, states she has to write things down and set timers.     INTERVAL HISTORY 01/09/2024:  Patient presents today for follow-up, last visit was in July 2023, since then, she tells me that she continues to have headaches on average 8-10 headaches per month.  She tells me that sumatriptan is helpful.  She did not pick up the Nurtec even though it was approved.  Patient tells me that Imitrex was better as abortive medication than Nurtec.  Denies any changes in her migraine severity. In terms of her memory, she feels it is the same, she has to rely on her calendar, has to write things down to avoid forgetting.  She is still independent in all actives of daily living, she still works, no recent changes. She has been taking Provasil and Auri Mushroom supplement for her memory.     HISTORY OF PRESENT ILLNESS:  This is a 68 year old woman past medical history of CAD, hyperlipidemia, anxiety, depression, and fibromyalgia who is presenting with memory complaint.  Patient described the memory problem as being forgetful, she reports event where she put eggs on the stove and forgot them until they break.  She does report a history of forgetting names of celebrities or but does not have issue with names of family members.  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  She still drives denies being lost in familiar places, but reported 1 episode of confusion about which grocery store she was in.  She still  pays her bills, she lives alone and is very independent. She reported her mother was diagnosed with dementia at the age of 25.  She also has a history of migraine headaches, currently not on any preventive headaches but gets more than 10 headaches a month. She is on Imitrex as migraine abortive. In the past, she has tried Topiramate, Duloxetine, Gabapentin.     TBI:   No past history of TBI Stroke:   no past history of stroke Seizures:   no past history of seizures Sleep:   no history of sleep apnea.   Mood:  Yes, anxiety and depression  Functional status: independent in all ADLs and IADLs Patient lives alone. Cooking: Yes  Cleaning: Yes  Shopping: doing ok  Bathing: no help needed  Toileting: no help needed  Driving: Yes, no recent accident  Bills: Yes, pay them on time   Ever left the stove on by accident?: Yes  Forget how to use items around the house?: No  Getting lost going to familiar places?: Yes Forgetting loved ones names?: No Word finding difficulty? Yes  Sleep: Good    OTHER MEDICAL CONDITIONS: CAD, Hyperlipidemia, Fibromyalgia, anxiety and depression    REVIEW OF SYSTEMS: Full 14 system review of systems performed and negative with exception of: As noted in the HPI    ALLERGIES: Allergies  Allergen Reactions   Clindamycin/Lincomycin Other (See Comments)    C-Diff    HOME MEDICATIONS:  Outpatient Medications Prior to Visit  Medication Sig Dispense Refill   Aspirin-Acetaminophen-Caffeine (GOODY HEADACHE PO) Take by mouth.     clonazePAM (KLONOPIN) 0.5 MG tablet TAKE 1 TABLET BY MOUTH TWICE A DAY AS NEEDED 60 tablet 5   cyclobenzaprine (FLEXERIL) 10 MG tablet TAKE 1 TABLET BY MOUTH EVERY DAY AT BEDTIME AS NEEDED 90 tablet 1   DULoxetine (CYMBALTA) 60 MG capsule TAKE 1 CAPSULE BY MOUTH TWICE A DAY 180 capsule 3   ezetimibe (ZETIA) 10 MG tablet Take 1 tablet (10 mg total) by mouth daily. 90 tablet 3   ibuprofen (ADVIL) 800 MG tablet Take 800 mg by mouth 3  (three) times daily.     metoprolol tartrate (LOPRESSOR) 25 MG tablet TAKE 0.5 TABLETS (12.5 MG TOTAL) BY MOUTH 2 (TWO) TIMES DAILY AS NEEDED. 90 tablet 3   Omega-3 Fatty Acids (OMEGA-3 FISH OIL PO) Take by mouth.     rosuvastatin (CRESTOR) 40 MG tablet TAKE 1 TABLET BY MOUTH EVERY DAY 90 tablet 3   SUMAtriptan (IMITREX) 100 MG tablet TAKE AS DIRECTED 9 tablet 13   traMADol (ULTRAM) 50 MG tablet Take 1 tablet (50 mg total) by mouth daily as needed. for pain 30 tablet 2   Turmeric (QC TUMERIC COMPLEX PO) Take by mouth.     valACYclovir (VALTREX) 1000 MG tablet TAKE 1 TABLET BY MOUTH TWICE A DAY 180 tablet 1   No facility-administered medications prior to visit.    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   Unspecified vitamin D deficiency     PAST SURGICAL HISTORY: Past Surgical History:  Procedure Laterality Date   AUGMENTATION MAMMAPLASTY Bilateral 04/2000   saline   BACK SURGERY     BREAST SURGERY     COLONOSCOPY  10/2004   normal   LUMBAR LAMINECTOMY     2024   SHOULDER SURGERY Right    SIGMOIDOSCOPY  2011   TUBAL LIGATION     bilat    FAMILY HISTORY: Family History  Problem Relation Age of Onset   Ovarian cancer Mother 34   Osteoarthritis Mother    Depression Mother    Heart disease Father    Depression Sister    Hypertension Maternal Grandmother    Osteoporosis Maternal Grandmother    Ulcers Maternal Grandmother    Heart disease Maternal Grandfather    Colon cancer Neg Hx    Colon polyps Neg Hx    Esophageal cancer Neg Hx    Rectal cancer Neg Hx    Stomach cancer Neg Hx    BRCA 1/2 Neg Hx    Breast cancer Neg  Hx     SOCIAL HISTORY: Social History   Socioeconomic History   Marital status: Divorced    Spouse name: Not on file   Number of children: 3   Years of education: Not on file   Highest education level: Associate degree: occupational, Scientist, product/process development, or vocational program  Occupational History   Occupation: Designer, multimedia: [employer]  Tobacco Use  Smoking status: Never   Smokeless tobacco: Never  Vaping Use   Vaping status: Never Used  Substance and Sexual Activity   Alcohol use: Yes    Alcohol/week: 4.0 standard drinks of alcohol    Types: 4 Glasses of wine per week    Comment: with dinner   Drug use: No   Sexual activity: Yes    Partners: Male    Birth control/protection: Surgical    Comment: BTL  Other Topics Concern   Not on file  Social History Narrative   Daily caffeine use 1 per day   Social Drivers of Health   Financial Resource Strain: Low Risk  (09/30/2023)   Overall Financial Resource Strain (CARDIA)    Difficulty of Paying Living Expenses: Not very hard  Food Insecurity: No Food Insecurity (09/30/2023)   Hunger Vital Sign    Worried About Running Out of Food in the Last Year: Never true    Ran Out of Food in the Last Year: Never true  Transportation Needs: No Transportation Needs (09/30/2023)   PRAPARE - Administrator, Civil Service (Medical): No    Lack of Transportation (Non-Medical): No  Physical Activity: Insufficiently Active (09/30/2023)   Exercise Vital Sign    Days of Exercise per Week: 2 days    Minutes of Exercise per Session: 20 min  Stress: No Stress Concern Present (09/30/2023)   [employer] of Occupational Health - Occupational Stress Questionnaire    Feeling of Stress : Only a little  Social Connections: Moderately Integrated (09/30/2023)   Social Connection and Isolation Panel [NHANES]    Frequency of Communication with Friends and Family: Three times a week    Frequency of Social Gatherings with  Friends and Family: Twice a week    Attends Religious Services: More than 4 times per year    Active Member of [employer] or Organizations: Yes    Attends Engineer, structural: More than 4 times per year    Marital Status: Divorced  Intimate Partner Violence: Not At Risk (10/04/2022)   Humiliation, Afraid, Rape, and Kick questionnaire    Fear of Current or Ex-Partner: No    Emotionally Abused: No    Physically Abused: No    Sexually Abused: No    PHYSICAL EXAM  GENERAL EXAM/CONSTITUTIONAL: Vitals:  Vitals:   01/09/24 1432  BP: 122/76  Pulse: 72  Weight: 152 lb (68.9 kg)  Height: 5\' 9"  (1.753 m)   Body mass index is 22.45 kg/m. Wt Readings from Last 3 Encounters:  01/09/24 152 lb (68.9 kg)  09/30/23 151 lb (68.5 kg)  04/21/23 149 lb (67.6 kg)   Patient is in no distress; well developed, nourished and groomed; neck is supple  MUSCULOSKELETAL: Gait, strength, tone, movements noted in Neurologic exam below  NEUROLOGIC: MENTAL STATUS:      No data to display            01/09/2024    2:38 PM 07/08/2022    8:07 AM  Montreal Cognitive Assessment   Visuospatial/ Executive (0/5) 5 5  Naming (0/3) 3 3  Attention: Read list of digits (0/2) 2 2  Attention: Read list of letters (0/1) 1 1  Attention: Serial 7 subtraction starting at 100 (0/3) 3 3  Language: Repeat phrase (0/2) 2 2  Language : Fluency (0/1) 1 1  Abstraction (0/2) 2 2  Delayed Recall (0/5) 4 1  Orientation (0/6) 6 6  Total 29 26    CRANIAL NERVE:  2nd,  3rd, 4th, 6th - pupils equal and reactive to light, visual fields full to confrontation, extraocular muscles intact, no nystagmus 5th - facial sensation symmetric 7th - facial strength symmetric 8th - hearing intact 9th - palate elevates symmetrically, uvula midline 11th - shoulder shrug symmetric 12th - tongue protrusion midline  MOTOR:  normal bulk and tone  GAIT/STATION:  normal   DIAGNOSTIC DATA (LABS, IMAGING, TESTING) - I reviewed  patient records, labs, notes, testing and imaging myself where available.  Lab Results  Component Value Date   WBC 7.3 04/21/2023   HGB 12.1 04/21/2023   HCT 35.3 (L) 04/21/2023   MCV 95.3 04/21/2023   PLT 256.0 04/21/2023      Component Value Date/Time   NA 140 04/21/2023 1347   K 4.4 04/21/2023 1347   CL 104 04/21/2023 1347   CO2 29 04/21/2023 1347   GLUCOSE 93 04/21/2023 1347   BUN 24 (H) 04/21/2023 1347   CREATININE 1.11 04/21/2023 1347   CALCIUM 9.3 04/21/2023 1347   PROT 7.0 04/21/2023 1347   ALBUMIN 4.2 04/21/2023 1347   AST 23 04/21/2023 1347   ALT 18 04/21/2023 1347   ALKPHOS 62 04/21/2023 1347   BILITOT 0.4 04/21/2023 1347   GFRNONAA 70.02 06/30/2010 1218   GFRAA 97 04/07/2007 0817   Lab Results  Component Value Date   CHOL 234 (H) 04/21/2023   HDL 87.20 04/21/2023   LDLCALC 119 (H) 04/21/2023   LDLDIRECT 133.2 09/14/2013   TRIG 142.0 04/21/2023   CHOLHDL 3 04/21/2023   Lab Results  Component Value Date   HGBA1C 5.9 04/21/2023   Lab Results  Component Value Date   VITAMINB12 >1500 (H) 04/21/2023   Lab Results  Component Value Date   TSH 1.78 04/21/2023     ASSESSMENT AND PLAN  68 y.o. year old female with migraine headaches, CAD, hyperlipidemia, anxiety, depression, and fibromyalgia who is presenting with memory complaint.  In terms of her memory complaint, she is doing well, still works, independent in all her ADLs and iADLs. Moca 29. Informed patient that she has normal cognition.  For her headaches, we will try her on Nurtec as abortive and have patient try Zavzpret as abortive. If Zavzpret not approved, we will continue her on Sumatriptan. Continue to follow up with PCP and return in 6 months or sooner if worse.    1. Episodic migraine   2. Cognitive complaints with normal exam     Patient Instructions  Start Nurtec every other day preventive medication Start Zavzpret as abortive medication If Zavzpret not approved, will continue her on  sumatriptan Continue your other medication Follow-up in 1 year or sooner if worse.  Can follow-up with NP.  No orders of the defined types were placed in this encounter.   Meds ordered this encounter  Medications   Rimegepant Sulfate (NURTEC) 75 MG TBDP    Sig: Take 1 tablet (75 mg total) by mouth every other day.    Dispense:  16 tablet    Refill:  11   Zavegepant HCl (ZAVZPRET) 10 MG/ACT SOLN    Sig: Place 10 mg into the nose as needed (migraines).    Dispense:  6 each    Refill:  2    Return in about 1 year (around 01/08/2025).   Windell Norfolk, MD 01/09/2024, 5:57 PM  [facility] [address], [address] [place], [place] [address] [phone]

## 2024-01-09 NOTE — Patient Instructions (Addendum)
Start Nurtec every other day preventive medication Start Zavzpret as abortive medication If Zavzpret not approved, will continue her on sumatriptan Continue your other medication Follow-up in 1 year or sooner if worse.  Can follow-up with NP.

## 2024-01-10 ENCOUNTER — Telehealth: Payer: Self-pay | Admitting: Anesthesiology

## 2024-01-12 ENCOUNTER — Other Ambulatory Visit: Payer: Self-pay | Admitting: Internal Medicine

## 2024-01-12 ENCOUNTER — Ambulatory Visit
Admission: RE | Admit: 2024-01-12 | Discharge: 2024-01-12 | Disposition: A | Discharge status: Home / Self Care | Payer: PPO | Source: Ambulatory Visit | Attending: Internal Medicine | Admitting: Internal Medicine

## 2024-01-12 DIAGNOSIS — R921 Mammographic calcification found on diagnostic imaging of breast: Secondary | ICD-10-CM

## 2024-01-13 ENCOUNTER — Encounter: Payer: Self-pay | Admitting: Internal Medicine

## 2024-01-13 ENCOUNTER — Other Ambulatory Visit: Payer: Self-pay | Admitting: Internal Medicine

## 2024-01-13 DIAGNOSIS — E7800 Pure hypercholesterolemia, unspecified: Secondary | ICD-10-CM

## 2024-01-13 DIAGNOSIS — R931 Abnormal findings on diagnostic imaging of heart and coronary circulation: Secondary | ICD-10-CM

## 2024-01-13 DIAGNOSIS — R739 Hyperglycemia, unspecified: Secondary | ICD-10-CM

## 2024-01-13 DIAGNOSIS — R9431 Abnormal electrocardiogram [ECG] [EKG]: Secondary | ICD-10-CM

## 2024-01-20 DIAGNOSIS — L821 Other seborrheic keratosis: Secondary | ICD-10-CM | POA: Diagnosis not present

## 2024-01-20 DIAGNOSIS — C44712 Basal cell carcinoma of skin of right lower limb, including hip: Secondary | ICD-10-CM | POA: Diagnosis not present

## 2024-01-20 DIAGNOSIS — D485 Neoplasm of uncertain behavior of skin: Secondary | ICD-10-CM | POA: Diagnosis not present

## 2024-01-27 ENCOUNTER — Encounter: Payer: Self-pay | Admitting: Internal Medicine

## 2024-01-27 ENCOUNTER — Other Ambulatory Visit: Payer: Self-pay | Admitting: Internal Medicine

## 2024-01-27 ENCOUNTER — Ambulatory Visit
Admission: RE | Admit: 2024-01-27 | Discharge: 2024-01-27 | Disposition: A | Discharge status: Home / Self Care | Payer: Self-pay | Source: Ambulatory Visit | Attending: Internal Medicine | Admitting: Internal Medicine

## 2024-01-27 DIAGNOSIS — R739 Hyperglycemia, unspecified: Secondary | ICD-10-CM

## 2024-01-27 DIAGNOSIS — R9431 Abnormal electrocardiogram [ECG] [EKG]: Secondary | ICD-10-CM

## 2024-01-27 DIAGNOSIS — R931 Abnormal findings on diagnostic imaging of heart and coronary circulation: Secondary | ICD-10-CM

## 2024-01-27 DIAGNOSIS — E7800 Pure hypercholesterolemia, unspecified: Secondary | ICD-10-CM

## 2024-01-27 MED ORDER — ASPIRIN 81 MG PO TBEC: 81.0000 mg | DELAYED_RELEASE_TABLET | Freq: Every day | ORAL | Status: AC

## 2024-02-01 ENCOUNTER — Encounter: Payer: Self-pay | Admitting: Cardiology

## 2024-02-01 ENCOUNTER — Ambulatory Visit: Payer: PPO | Attending: Cardiology | Admitting: Cardiology

## 2024-02-01 VITALS — BP 122/76 | HR 70 | Ht 69.0 in | Wt 152.8 lb

## 2024-02-01 DIAGNOSIS — E7800 Pure hypercholesterolemia, unspecified: Secondary | ICD-10-CM

## 2024-02-01 DIAGNOSIS — I2510 Atherosclerotic heart disease of native coronary artery without angina pectoris: Secondary | ICD-10-CM

## 2024-02-01 NOTE — Patient Instructions (Addendum)
Medication Instructions:   Your physician recommends that you continue on your current medications as directed. Please refer to the Current Medication list given to you today.  *If you need a refill on your cardiac medications before your next appointment, please call your pharmacy*   Lab Work:  Your provider would like for you to return the day of your Echocardiogram to have the following labs drawn: LIPID.   Please go to [facility] [address] ([facility] [address], [place] [address] You do not need an appointment.  They are open from 8 am- 4:30 pm.  Lunch from 1:00 pm- 2:00 pm You WILL need to be fasting.   If you have labs (blood work) drawn today and your tests are completely normal, you will receive your results only by: MyChart Message (if you have MyChart) OR A paper copy in the mail If you have any lab test that is abnormal or we need to change your treatment, we will call you to review the results.   Testing/Procedures:  Your physician has requested that you have an echocardiogram. Echocardiography is a painless test that uses sound waves to create images of your heart. It provides your doctor with information about the size and shape of your heart and how well your heart's chambers and valves are working. This procedure takes approximately one hour. There are no restrictions for this procedure. Please do NOT wear cologne, perfume, aftershave, or lotions (deodorant is allowed). Please arrive 15 minutes prior to your appointment time.  Please note: We ask at that you not bring children with you during ultrasound (echo/ vascular) testing. Due to room size and safety concerns, children are not allowed in the ultrasound rooms during exams. Our front office staff cannot provide observation of children in our lobby area while testing is being conducted. An adult accompanying a patient to their appointment will only be allowed in the ultrasound room at the  discretion of the ultrasound technician under special circumstances. We apologize for any inconvenience.  Your physician has requested that you have a carotid duplex. This test is an ultrasound of the carotid arteries in your neck. It looks at blood flow through these arteries that supply the brain with blood. Allow one hour for this exam. There are no restrictions or special instructions.   Follow-Up: At [facility], you and your health needs are our priority.  As part of our continuing mission to provide you with exceptional heart care, we have created designated Provider Care Teams.  These Care Teams include your primary Cardiologist (physician) and Advanced Practice Providers (APPs -  Physician Assistants and Nurse Practitioners) who all work together to provide you with the care you need, when you need it.  We recommend signing up for the patient portal called "MyChart".  Sign up information is provided on this After Visit Summary.  MyChart is used to connect with patients for Virtual Visits (Telemedicine).  Patients are able to view lab/test results, encounter notes, upcoming appointments, etc.  Non-urgent messages can be sent to your provider as well.   To learn more about what you can do with MyChart, go to [email or web address].    Your next appointment:   2 - 3 month(s)  Provider:   You may see Debbe Odea, MD or one of the following Advanced Practice Providers on your designated Care Team:   Nicolasa Ducking, NP Eula Listen, PA-C Cadence Fransico Michael, PA-C Charlsie Quest, NP Carlos Levering, NP

## 2024-02-01 NOTE — Progress Notes (Signed)
Cardiology Office Note:    Date:  02/01/2024   ID:  [name], DOB Aug 23, 1956, MRN [id number]  PCP:  Christina Levins, MD   [facility] Providers Cardiologist:  Debbe Odea, MD     Referring MD: Christina Levins, MD   Chief Complaint  Patient presents with   New Patient (Initial Visit)    Referred for cardiac evaluation of Elevated coronary artery calcium score   Christina Waller is a 68 y.o. female who is being seen today for the evaluation of coronary calcification at the request of Christina Levins, MD.   History of Present Illness:    Christina Waller is a 68 y.o. female with a hx of CAD (LAD, RCA calcification), hyperlipidemia presenting with elevated coronary calcification.  Patient had a calcium scan on 11/25/2024 score was 480, 93rd percentile.  She denies chest pain or shortness of breath.  Endorses nonspecific neck pains.  Denies any immediate family history of heart attacks.  Compliant with cholesterol medicines as prescribed, Zetia was added to Crestor about 2 months ago.  She was started on aspirin 81 mg after calcium score results.  Echo 02/2022 EF 55 to 60%.  Past Medical History:  Diagnosis Date   Allergic rhinitis    Anemia    NOS   Anxiety    Bronchiectasis (HCC) 01/05/2019   Depression    Fibromyalgia    Headache(784.0)    HNP (herniated nucleus pulposus), lumbar    L 2-3   Hyperlipidemia    Migraines    Osteopenia    Restless leg syndrome    STD (sexually transmitted disease)    HSV that outbreaks mid back   Unspecified vitamin D deficiency     Past Surgical History:  Procedure Laterality Date   AUGMENTATION MAMMAPLASTY Bilateral 04/2000   saline   BACK SURGERY     BREAST SURGERY     COLONOSCOPY  10/2004   normal   LUMBAR LAMINECTOMY     2024   SHOULDER SURGERY Right    SIGMOIDOSCOPY  2011   TUBAL LIGATION     bilat    Current Medications: Current Meds  Medication Sig   aspirin EC 81 MG tablet Take 1 tablet (81 mg total)  by mouth daily. Swallow whole.   Aspirin-Acetaminophen-Caffeine (GOODY HEADACHE PO) Take by mouth.   clonazePAM (KLONOPIN) 0.5 MG tablet TAKE 1 TABLET BY MOUTH TWICE A DAY AS NEEDED   cyclobenzaprine (FLEXERIL) 10 MG tablet TAKE 1 TABLET BY MOUTH EVERY DAY AT BEDTIME AS NEEDED   DULoxetine (CYMBALTA) 60 MG capsule TAKE 1 CAPSULE BY MOUTH TWICE A DAY   ezetimibe (ZETIA) 10 MG tablet Take 1 tablet (10 mg total) by mouth daily.   metoprolol tartrate (LOPRESSOR) 25 MG tablet TAKE 0.5 TABLETS (12.5 MG TOTAL) BY MOUTH 2 (TWO) TIMES DAILY AS NEEDED.   rosuvastatin (CRESTOR) 40 MG tablet TAKE 1 TABLET BY MOUTH EVERY DAY   SUMAtriptan (IMITREX) 100 MG tablet TAKE AS DIRECTED   traMADol (ULTRAM) 50 MG tablet TAKE 1 TABLET (50 MG TOTAL) BY MOUTH DAILY AS NEEDED. FOR PAIN   Turmeric (QC TUMERIC COMPLEX PO) Take by mouth.   valACYclovir (VALTREX) 1000 MG tablet TAKE 1 TABLET BY MOUTH TWICE A DAY     Allergies:   Clindamycin/lincomycin   Social History   Socioeconomic History   Marital status: Divorced    Spouse name: Not on file   Number of children: 3   Years of education:  Not on file   Highest education level: Associate degree: occupational, technical, or vocational program  Occupational History   Occupation: dental hygenist    Employer: [employer]  Tobacco Use   Smoking status: Never   Smokeless tobacco: Never  Vaping Use   Vaping status: Never Used  Substance and Sexual Activity   Alcohol use: Yes    Alcohol/week: 4.0 standard drinks of alcohol    Types: 4 Glasses of wine per week    Comment: with dinner   Drug use: No   Sexual activity: Yes    Partners: Male    Birth control/protection: Surgical    Comment: BTL  Other Topics Concern   Not on file  Social History Narrative   Daily caffeine use 1 per day   Social Drivers of Health   Financial Resource Strain: Low Risk  (09/30/2023)   Overall Financial Resource Strain (CARDIA)    Difficulty of Paying Living Expenses: Not  very hard  Food Insecurity: No Food Insecurity (09/30/2023)   Hunger Vital Sign    Worried About Running Out of Food in the Last Year: Never true    Ran Out of Food in the Last Year: Never true  Transportation Needs: No Transportation Needs (09/30/2023)   PRAPARE - Administrator, Civil Service (Medical): No    Lack of Transportation (Non-Medical): No  Physical Activity: Insufficiently Active (09/30/2023)   Exercise Vital Sign    Days of Exercise per Week: 2 days    Minutes of Exercise per Session: 20 min  Stress: No Stress Concern Present (09/30/2023)   [employer] of Occupational Health - Occupational Stress Questionnaire    Feeling of Stress : Only a little  Social Connections: Moderately Integrated (09/30/2023)   Social Connection and Isolation Panel [NHANES]    Frequency of Communication with Friends and Family: Three times a week    Frequency of Social Gatherings with Friends and Family: Twice a week    Attends Religious Services: More than 4 times per year    Active Member of [employer] or Organizations: Yes    Attends Engineer, structural: More than 4 times per year    Marital Status: Divorced     Family History: The patient's family history includes Depression in her mother and sister; Heart disease in her father and maternal grandfather; Hypertension in her maternal grandmother; Osteoarthritis in her mother; Osteoporosis in her maternal grandmother; Ovarian cancer (age of onset: 69) in her mother; Ulcers in her maternal grandmother. There is no history of Colon cancer, Colon polyps, Esophageal cancer, Rectal cancer, Stomach cancer, BRCA 1/2, or Breast cancer.  ROS:   Please see the history of present illness.     All other systems reviewed and are negative.  EKGs/Labs/Other Studies Reviewed:    The following studies were reviewed today:  EKG Interpretation Date/Time:  Wednesday February 01 2024 11:19:22 EST Ventricular Rate:  70 PR  Interval:  166 QRS Duration:  68 QT Interval:  398 QTC Calculation: 429 R Axis:   40  Text Interpretation: Normal sinus rhythm Low voltage QRS Confirmed by Debbe Odea ([id number]) on 02/01/2024 11:53:10 AM    Recent Labs: 04/21/2023: ALT 18; BUN 24; Creatinine, Ser 1.11; Hemoglobin 12.1; Platelets 256.0; Potassium 4.4; Sodium 140; TSH 1.78  Recent Lipid Panel    Component Value Date/Time   CHOL 234 (H) 04/21/2023 1347   TRIG 142.0 04/21/2023 1347   HDL 87.20 04/21/2023 1347   CHOLHDL 3 04/21/2023 1347  VLDL 28.4 04/21/2023 1347   LDLCALC 119 (H) 04/21/2023 1347   LDLDIRECT 133.2 09/14/2013 1410     Risk Assessment/Calculations:              Physical Exam:    VS:  BP 122/76 (BP Location: Left Arm, Patient Position: Sitting, Cuff Size: Normal)   Pulse 70   Ht 5\' 9"  (1.753 m)   Wt 152 lb 12.8 oz (69.3 kg)   LMP 07/13/2006 (Approximate)   SpO2 98%   BMI 22.56 kg/m     Wt Readings from Last 3 Encounters:  02/01/24 152 lb 12.8 oz (69.3 kg)  01/09/24 152 lb (68.9 kg)  09/30/23 151 lb (68.5 kg)     GEN:  Well nourished, well developed in no acute distress HEENT: Normal NECK: No JVD; No carotid bruits CARDIAC: RRR, no murmurs, rubs, gallops RESPIRATORY:  Clear to auscultation without rales, wheezing or rhonchi  ABDOMEN: Soft, non-tender, non-distended MUSCULOSKELETAL:  No edema; No deformity  SKIN: Warm and dry NEUROLOGIC:  Alert and oriented x 3 PSYCHIATRIC:  Normal affect   ASSESSMENT:    1. Coronary artery disease involving native coronary artery of native heart, unspecified whether angina present   2. Pure hypercholesterolemia    PLAN:    In order of problems listed above:  CAD (LAD, RCA calcification), calcium score 480, 93rd percentile.  Aspirin 81 mg daily, continue Crestor 40 mg daily, Zetia 10 mg daily.  Obtain echocardiogram.  Obtain carotid ultrasound due to neck pain.  Patient has no symptoms consistent with angina.  Consider additional testing if  she develops anginal-like symptoms or EF is reduced. Hyperlipidemia, continue aspirin, Crestor 40 mg daily, Zetia 10 mg daily.  Repeat lipid panel.  Follow-up after cardiac testing.      Medication Adjustments/Labs and Tests Ordered: Current medicines are reviewed at length with the patient today.  Concerns regarding medicines are outlined above.  Orders Placed This Encounter  Procedures   Lipid panel   EKG 12-Lead   ECHOCARDIOGRAM COMPLETE   VAS US CAROTID   No orders of the defined types were placed in this encounter.   Patient Instructions  Medication Instructions:   Your physician recommends that you continue on your current medications as directed. Please refer to the Current Medication list given to you today.  *If you need a refill on your cardiac medications before your next appointment, please call your pharmacy*   Lab Work:  Your provider would like for you to return the day of your Echocardiogram to have the following labs drawn: LIPID.   Please go to [facility] [address] ([facility] [address], [place] [address] You do not need an appointment.  They are open from 8 am- 4:30 pm.  Lunch from 1:00 pm- 2:00 pm You WILL need to be fasting.   If you have labs (blood work) drawn today and your tests are completely normal, you will receive your results only by: MyChart Message (if you have MyChart) OR A paper copy in the mail If you have any lab test that is abnormal or we need to change your treatment, we will call you to review the results.   Testing/Procedures:  Your physician has requested that you have an echocardiogram. Echocardiography is a painless test that uses sound waves to create images of your heart. It provides your doctor with information about the size and shape of your heart and how well your heart's chambers and valves are working. This procedure takes approximately  one hour. There are no restrictions for this  procedure. Please do NOT wear cologne, perfume, aftershave, or lotions (deodorant is allowed). Please arrive 15 minutes prior to your appointment time.  Please note: We ask at that you not bring children with you during ultrasound (echo/ vascular) testing. Due to room size and safety concerns, children are not allowed in the ultrasound rooms during exams. Our front office staff cannot provide observation of children in our lobby area while testing is being conducted. An adult accompanying a patient to their appointment will only be allowed in the ultrasound room at the discretion of the ultrasound technician under special circumstances. We apologize for any inconvenience.  Your physician has requested that you have a carotid duplex. This test is an ultrasound of the carotid arteries in your neck. It looks at blood flow through these arteries that supply the brain with blood. Allow one hour for this exam. There are no restrictions or special instructions.   Follow-Up: At [facility], you and your health needs are our priority.  As part of our continuing mission to provide you with exceptional heart care, we have created designated Provider Care Teams.  These Care Teams include your primary Cardiologist (physician) and Advanced Practice Providers (APPs -  Physician Assistants and Nurse Practitioners) who all work together to provide you with the care you need, when you need it.  We recommend signing up for the patient portal called "MyChart".  Sign up information is provided on this After Visit Summary.  MyChart is used to connect with patients for Virtual Visits (Telemedicine).  Patients are able to view lab/test results, encounter notes, upcoming appointments, etc.  Non-urgent messages can be sent to your provider as well.   To learn more about what you can do with MyChart, go to [email or web address].    Your next appointment:   2 - 3 month(s)  Provider:   You may see Debbe Odea, MD or one of the following Advanced Practice Providers on your designated Care Team:   Nicolasa Ducking, NP Eula Listen, PA-C Cadence Fransico Michael, PA-C Charlsie Quest, NP Carlos Levering, NP   Signed, Debbe Odea, MD  02/01/2024 12:02 PM    [facility]

## 2024-02-09 ENCOUNTER — Other Ambulatory Visit: Payer: Self-pay | Admitting: Neurology

## 2024-02-09 DIAGNOSIS — C44712 Basal cell carcinoma of skin of right lower limb, including hip: Secondary | ICD-10-CM | POA: Diagnosis not present

## 2024-02-10 NOTE — Telephone Encounter (Signed)
 Last seen on 01/09/24 Follow up scheduled on 01/10/25

## 2024-02-13 ENCOUNTER — Telehealth: Payer: Self-pay

## 2024-02-13 NOTE — Telephone Encounter (Signed)
  Imaging called, spoke to [place] about  CT Cardiac scoring result, let them aware that we did receive the result and has been resulted by Pt provider

## 2024-02-17 ENCOUNTER — Other Ambulatory Visit: Payer: Self-pay | Admitting: Cardiology

## 2024-02-17 DIAGNOSIS — I6523 Occlusion and stenosis of bilateral carotid arteries: Secondary | ICD-10-CM

## 2024-02-17 DIAGNOSIS — E7800 Pure hypercholesterolemia, unspecified: Secondary | ICD-10-CM

## 2024-02-17 DIAGNOSIS — I2510 Atherosclerotic heart disease of native coronary artery without angina pectoris: Secondary | ICD-10-CM

## 2024-03-02 ENCOUNTER — Ambulatory Visit: Payer: PPO

## 2024-03-02 ENCOUNTER — Other Ambulatory Visit
Admission: RE | Admit: 2024-03-02 | Discharge: 2024-03-02 | Disposition: A | Discharge status: Home / Self Care | Attending: Cardiology | Admitting: Cardiology

## 2024-03-02 ENCOUNTER — Ambulatory Visit: Payer: PPO | Attending: Cardiology

## 2024-03-02 DIAGNOSIS — E7800 Pure hypercholesterolemia, unspecified: Secondary | ICD-10-CM | POA: Insufficient documentation

## 2024-03-02 DIAGNOSIS — I2510 Atherosclerotic heart disease of native coronary artery without angina pectoris: Secondary | ICD-10-CM | POA: Diagnosis not present

## 2024-03-02 DIAGNOSIS — I6523 Occlusion and stenosis of bilateral carotid arteries: Secondary | ICD-10-CM | POA: Diagnosis not present

## 2024-03-02 LAB — LIPID PANEL
Cholesterol: 181 mg/dL (ref 0–200)
HDL: 73 mg/dL (ref >40)
LDL Cholesterol: 89 mg/dL (ref 0–99)
Total CHOL/HDL Ratio: 2.5 ratio
Triglycerides: 93 mg/dL (ref <150)
VLDL: 19 mg/dL (ref 0–40)

## 2024-03-02 LAB — ECHOCARDIOGRAM COMPLETE
AR max vel: 2.63 cm2
AV Area VTI: 2.73 cm2
AV Area mean vel: 2.68 cm2
AV Mean grad: 3 mmHg
AV Peak grad: 5.2 mmHg
Ao pk vel: 1.14 m/s
Area-P 1/2: 3.17 cm2
Calc EF: 55.5 %
S' Lateral: 2.9 cm
Single Plane A2C EF: 52.9 %
Single Plane A4C EF: 57.3 %

## 2024-03-05 ENCOUNTER — Encounter: Payer: Self-pay | Admitting: Emergency Medicine

## 2024-03-08 ENCOUNTER — Other Ambulatory Visit: Payer: Self-pay | Admitting: Internal Medicine

## 2024-04-05 ENCOUNTER — Ambulatory Visit: Payer: PPO | Attending: Cardiology | Admitting: Cardiology

## 2024-04-05 ENCOUNTER — Encounter: Payer: Self-pay | Admitting: Cardiology

## 2024-04-05 ENCOUNTER — Ambulatory Visit: Payer: PPO | Admitting: Neurology

## 2024-04-05 VITALS — BP 110/60 | HR 78 | Ht 69.0 in | Wt 150.5 lb

## 2024-04-05 DIAGNOSIS — I2510 Atherosclerotic heart disease of native coronary artery without angina pectoris: Secondary | ICD-10-CM | POA: Diagnosis not present

## 2024-04-05 DIAGNOSIS — E7800 Pure hypercholesterolemia, unspecified: Secondary | ICD-10-CM | POA: Diagnosis not present

## 2024-04-05 NOTE — Patient Instructions (Signed)
 Medication Instructions:  Your physician recommends that you continue on your current medications as directed. Please refer to the Current Medication list given to you today.  *If you need a refill on your cardiac medications before your next appointment, please call your pharmacy*  Lab Work: NONE   If you have labs (blood work) drawn today and your tests are completely normal, you will receive your results only by: MyChart Message (if you have MyChart) OR A paper copy in the mail If you have any lab test that is abnormal or we need to change your treatment, we will call you to review the results.  Testing/Procedures: NONE   Follow-Up: At [facility], you and your health needs are our priority.  As part of our continuing mission to provide you with exceptional heart care, our providers are all part of one team.  This team includes your primary Cardiologist (physician) and Advanced Practice Providers or APPs (Physician Assistants and Nurse Practitioners) who all work together to provide you with the care you need, when you need it.  Your next appointment:   1 year(s)  Provider:   You may see Constancia Delton, MD or one of the following Advanced Practice Providers on your designated Care Team:   Laneta Pintos, NP Gildardo Labrador, PA-C Varney Gentleman, PA-C Cadence [place], PA-C Ronald Cockayne, NP Morey Ar, NP    We recommend signing up for the patient portal called "MyChart".  Sign up information is provided on this After Visit Summary.  MyChart is used to connect with patients for Virtual Visits (Telemedicine).  Patients are able to view lab/test results, encounter notes, upcoming appointments, etc.  Non-urgent messages can be sent to your provider as well.   To learn more about what you can do with MyChart, go to [email or web address].   Other Instructions Thank you for choosing [facility]!

## 2024-04-05 NOTE — Progress Notes (Signed)
 Cardiology Office Note:    Date:  04/05/2024   ID:  Christina Waller, DOB 07/05/1956, MRN [id number]  PCP:  Roslyn Coombe, MD   [facility] Providers Cardiologist:  Constancia Delton, MD     Referring MD: Roslyn Coombe, MD   Chief Complaint  Patient presents with   2-3 month follow up     Follow up Echo/carotid ultrasound. "Doing well."      History of Present Illness:    Christina Waller is a 68 y.o. female with a hx of CAD (LAD, RCA calcification), hyperlipidemia presenting for follow-up.  Patient was last seen due to coronary artery calcification.  Echo was obtained to evaluate any significant structural abnormalities.  She denies chest pain or shortness of breath.  Compliant with medications including aspirin , Crestor , Zetia  as prescribed.  Presents for cardiac testing results.  Prior notes/testing Patient had a calcium  scan on 11/25/2024 score was 480, 93rd percentile. Echo 02/2022 EF 55 to 60%.  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 Swallow whole.   Aspirin -Acetaminophen -Caffeine (GOODY HEADACHE PO) Take by mouth.   clonazePAM  (KLONOPIN ) 0.5 MG tablet TAKE 1 TABLET BY MOUTH TWICE A DAY AS  NEEDED   cyclobenzaprine  (FLEXERIL ) 10 MG tablet TAKE 1 TABLET BY MOUTH EVERY DAY AT BEDTIME AS NEEDED   DULoxetine  (CYMBALTA ) 60 MG capsule TAKE 1 CAPSULE BY MOUTH TWICE A DAY   ezetimibe  (ZETIA ) 10 MG tablet Take 1 tablet (10 mg total) by mouth daily.   metoprolol  tartrate (LOPRESSOR ) 25 MG tablet TAKE 0.5 TABLETS (12.5 MG TOTAL) BY MOUTH 2 (TWO) TIMES DAILY AS NEEDED.   rosuvastatin  (CRESTOR ) 40 MG tablet TAKE 1 TABLET BY MOUTH EVERY DAY   SUMAtriptan  (IMITREX ) 100 MG tablet TAKE AS DIRECTED   Tart Cherry 500 MG CAPS    traMADol  (ULTRAM ) 50 MG tablet TAKE 1 TABLET (50 MG TOTAL) BY MOUTH DAILY AS NEEDED. FOR PAIN   Turmeric (QC TUMERIC COMPLEX PO) Take by mouth.   valACYclovir  (VALTREX ) 1000 MG tablet TAKE 1 TABLET BY MOUTH TWICE A DAY     Allergies:   Clindamycin/lincomycin   Social History   Socioeconomic History   Marital status: Divorced    Spouse name: Not on file   Number of children: 3   Years of education: Not on file   Highest education level: Associate degree: occupational, Scientist, product/process development, or vocational program  Occupational History   Occupation: Designer, multimedia: [employer]  Tobacco Use   Smoking status: Never   Smokeless tobacco: Never  Vaping Use   Vaping status: Never Used  Substance and Sexual Activity   Alcohol use: Yes    Alcohol/week: 4.0 standard drinks of alcohol    Types: 4 Glasses of wine per week    Comment: with dinner   Drug use: No   Sexual activity: Yes    Partners: Male    Birth control/protection: Surgical    Comment: BTL  Other Topics Concern   Not on file  Social History Narrative   Daily caffeine use 1 per day   Social Drivers of Health   Financial Resource Strain: Low Risk  (04/02/2024)   Overall Financial Resource Strain (CARDIA)    Difficulty of Paying Living Expenses: Not very hard  Food Insecurity: No Food Insecurity (04/02/2024)   Hunger Vital Sign    Worried About Running Out of Food in the Last Year: Never true     Ran Out of Food in the Last Year: Never true  Transportation Needs: No Transportation Needs (04/02/2024)   PRAPARE - Administrator, Civil Service (Medical): No    Lack of Transportation (Non-Medical): No  Physical Activity: Insufficiently Active (04/02/2024)   Exercise Vital Sign    Days of Exercise per Week: 2 days    Minutes of Exercise per Session: 50 min  Stress: No Stress Concern Present (04/02/2024)   [employer] of Occupational Health - Occupational Stress Questionnaire    Feeling of Stress : Only a little  Social Connections: Moderately Integrated (04/02/2024)   Social Connection and Isolation Panel [NHANES]    Frequency of Communication with Friends and Family: Three times a week    Frequency of Social Gatherings with Friends and Family: Twice a week    Attends Religious Services: More than 4 times per year    Active Member of [employer] or Organizations: Yes    Attends Engineer, structural: More than 4 times per year    Marital Status: Divorced     Family History: The patient's family history includes Depression in her mother and sister; Heart disease in her father and maternal grandfather; Hypertension in her maternal grandmother; Osteoarthritis in her mother; Osteoporosis in her maternal grandmother; Ovarian cancer (age of onset: 80) in her mother; Ulcers in her maternal grandmother. There is no history of Colon cancer, Colon polyps, Esophageal cancer, Rectal cancer, Stomach cancer, BRCA 1/2, or Breast cancer.  ROS:   Please see the history of present illness.     All other systems reviewed and are negative.  EKGs/Labs/Other Studies Reviewed:    The following studies were reviewed today:       Recent Labs: 04/21/2023: ALT 18; BUN 24; Creatinine, Ser 1.11; Hemoglobin 12.1; Platelets 256.0; Potassium 4.4; Sodium 140; TSH 1.78  Recent Lipid Panel    Component Value Date/Time   CHOL 181 03/02/2024 1008   TRIG 93 03/02/2024 1008   HDL 73 03/02/2024  1008   CHOLHDL 2.5 03/02/2024 1008   VLDL 19 03/02/2024 1008   LDLCALC 89 03/02/2024 1008   LDLDIRECT 133.2 09/14/2013 1410     Risk Assessment/Calculations:              Physical Exam:    VS:  BP 110/60 (BP Location: Left Arm, Patient Position: Sitting, Cuff Size: Normal)   Pulse 78   Ht 5\' 9"  (1.753 m)   Wt 150 lb 8 oz (68.3 kg)   LMP 07/13/2006 (  Approximate)   SpO2 97%   BMI 22.22 kg/m     Wt Readings from Last 3 Encounters:  04/05/24 150 lb 8 oz (68.3 kg)  02/01/24 152 lb 12.8 oz (69.3 kg)  01/09/24 152 lb (68.9 kg)     GEN:  Well nourished, well developed in no acute distress HEENT: Normal NECK: No JVD; No carotid bruits CARDIAC: RRR, no murmurs, rubs, gallops RESPIRATORY:  Clear to auscultation without rales, wheezing or rhonchi  ABDOMEN: Soft, non-tender, non-distended MUSCULOSKELETAL:  No edema; No deformity  SKIN: Warm and dry NEUROLOGIC:  Alert and oriented x 3 PSYCHIATRIC:  Normal affect   ASSESSMENT:    1. Coronary artery disease involving native coronary artery of native heart, unspecified whether angina present   2. Pure hypercholesterolemia     PLAN:    In order of problems listed above:  CAD (LAD, RCA calcification), calcium  score 480, 93rd percentile.  Echo 3/25 EF 60 to 65%.  Continue aspirin  81 mg daily, Crestor  40 mg daily, Zetia  10 mg daily. carotid ultrasound showed no carotid stenosis.  Denies chest pain. Hyperlipidemia, continue Crestor  40 mg daily, Zetia  10 mg daily.    Follow-up in 1 year.     Medication Adjustments/Labs and Tests Ordered: Current medicines are reviewed at length with the patient today.  Concerns regarding medicines are outlined above.  No orders of the defined types were placed in this encounter.  No orders of the defined types were placed in this encounter.   Patient Instructions  Medication Instructions:  Your physician recommends that you continue on your current medications as directed. Please refer to  the Current Medication list given to you today.  *If you need a refill on your cardiac medications before your next appointment, please call your pharmacy*  Lab Work: NONE   If you have labs (blood work) drawn today and your tests are completely normal, you will receive your results only by: MyChart Message (if you have MyChart) OR A paper copy in the mail If you have any lab test that is abnormal or we need to change your treatment, we will call you to review the results.  Testing/Procedures: NONE   Follow-Up: At [facility], you and your health needs are our priority.  As part of our continuing mission to provide you with exceptional heart care, our providers are all part of one team.  This team includes your primary Cardiologist (physician) and Advanced Practice Providers or APPs (Physician Assistants and Nurse Practitioners) who all work together to provide you with the care you need, when you need it.  Your next appointment:   1 year(s)  Provider:   You may see Constancia Delton, MD or one of the following Advanced Practice Providers on your designated Care Team:   Laneta Pintos, NP Gildardo Labrador, PA-C Varney Gentleman, PA-C Cadence [place], PA-C Ronald Cockayne, NP Morey Ar, NP    We recommend signing up for the patient portal called "MyChart".  Sign up information is provided on this After Visit Summary.  MyChart is used to connect with patients for Virtual Visits (Telemedicine).  Patients are able to view lab/test results, encounter notes, upcoming appointments, etc.  Non-urgent messages can be sent to your provider as well.   To learn more about what you can do with MyChart, go to [email or web address].   Other Instructions Thank you for choosing [facility]!           Signed, Constancia Delton, MD  04/05/2024 1:35 PM  [facility]

## 2024-04-06 ENCOUNTER — Encounter: Payer: Self-pay | Admitting: Internal Medicine

## 2024-04-06 ENCOUNTER — Ambulatory Visit (INDEPENDENT_AMBULATORY_CARE_PROVIDER_SITE_OTHER): Payer: PPO | Admitting: Internal Medicine

## 2024-04-06 VITALS — BP 120/70 | HR 70 | Temp 98.4°F | Ht 69.0 in | Wt 150.0 lb

## 2024-04-06 DIAGNOSIS — E538 Deficiency of other specified B group vitamins: Secondary | ICD-10-CM

## 2024-04-06 DIAGNOSIS — R739 Hyperglycemia, unspecified: Secondary | ICD-10-CM

## 2024-04-06 DIAGNOSIS — E7800 Pure hypercholesterolemia, unspecified: Secondary | ICD-10-CM

## 2024-04-06 DIAGNOSIS — G43809 Other migraine, not intractable, without status migrainosus: Secondary | ICD-10-CM

## 2024-04-06 DIAGNOSIS — Z0000 Encounter for general adult medical examination without abnormal findings: Secondary | ICD-10-CM

## 2024-04-06 DIAGNOSIS — Z0001 Encounter for general adult medical examination with abnormal findings: Secondary | ICD-10-CM

## 2024-04-06 DIAGNOSIS — E559 Vitamin D deficiency, unspecified: Secondary | ICD-10-CM | POA: Diagnosis not present

## 2024-04-06 DIAGNOSIS — L821 Other seborrheic keratosis: Secondary | ICD-10-CM | POA: Insufficient documentation

## 2024-04-06 DIAGNOSIS — F419 Anxiety disorder, unspecified: Secondary | ICD-10-CM | POA: Diagnosis not present

## 2024-04-06 MED ORDER — DULOXETINE HCL 60 MG PO CPEP: 60.0000 mg | ORAL_CAPSULE | Freq: Two times a day (BID) | ORAL | 3 refills | Status: AC

## 2024-04-06 MED ORDER — CYCLOBENZAPRINE HCL 10 MG PO TABS: 10.0000 mg | ORAL_TABLET | Freq: Three times a day (TID) | ORAL | 2 refills | Status: DC | PRN

## 2024-04-06 MED ORDER — METOPROLOL TARTRATE 25 MG PO TABS: 12.5000 mg | ORAL_TABLET | Freq: Two times a day (BID) | ORAL | 3 refills | Status: AC | PRN

## 2024-04-06 MED ORDER — VALACYCLOVIR HCL 1 G PO TABS: 1000.0000 mg | ORAL_TABLET | Freq: Two times a day (BID) | ORAL | 1 refills | Status: AC

## 2024-04-06 MED ORDER — CLONAZEPAM 0.5 MG PO TABS: 0.5000 mg | ORAL_TABLET | Freq: Two times a day (BID) | ORAL | 2 refills | Status: DC | PRN

## 2024-04-06 MED ORDER — ROSUVASTATIN CALCIUM 40 MG PO TABS: 40.0000 mg | ORAL_TABLET | Freq: Every day | ORAL | 3 refills | Status: AC

## 2024-04-06 MED ORDER — NURTEC 75 MG PO TBDP: ORAL_TABLET | ORAL | 11 refills | Status: DC

## 2024-04-06 NOTE — Progress Notes (Signed)
 Patient ID: Christina Waller, female   DOB: [birth year], 68 y.o.   MRN: [id number]         Chief Complaint:: wellness exam and hld, migraine, hyperglycemia, anxiety       HPI:  Christina Waller is a 68 y.o. female here for wellness exam; declines colonoscopy, for shignrix at pharmacy, o/w up to date                        Also had rov with cardiology doing well recently.  Was unable to get nurtec approved for preventive recently per neurology, now this years asks to try again.  Has > 3 migraine per week.  Denies worsening depressive symptoms, suicidal ideation, or panic; Does not want other med changes such as change zetia  to repatha for uncontrolled LDL to be less than 70 with hx of CAD by CT .  Imitrex  is not advised in this condition     Pt denies chest pain, increased sob or doe, wheezing, orthopnea, PND, increased LE swelling, palpitations, dizziness or syncope.   Pt denies polydipsia, polyuria, or new focal neuro s/s.    Also as ongoing maybe worsening low back pain with cont'd working as Tax inspector - plans to move to 2 days per week to better tolerate.     Wt Readings from Last 3 Encounters:  04/06/24 150 lb (68 kg)  04/05/24 150 lb 8 oz (68.3 kg)  02/01/24 152 lb 12.8 oz (69.3 kg)   BP Readings from Last 3 Encounters:  04/06/24 120/70  04/05/24 110/60  02/01/24 122/76   Immunization History  Administered Date(s) Administered   Fluad Quad(high Dose 65+) 10/18/2020   Fluad Trivalent(High Dose 65+) 08/25/2023   Influenza Inj Mdck Quad Pf 11/15/2018   Influenza Split 09/03/2011   Influenza, High Dose Seasonal PF 09/08/2022   Influenza,inj,Quad PF,6+ Mos 09/14/2013, 09/13/2014, 10/23/2015, 11/09/2016, 09/16/2017, 11/22/2019   Influenza-Unspecified 09/08/2022   PFIZER Comirnaty(Gray Top)Covid-19 Tri-Sucrose Vaccine 09/08/2022   PFIZER(Purple Top)SARS-COV-2 Vaccination 12/28/2019, 01/18/2020, 10/09/2020   PPD Test 09/04/2012   Pfizer Covid-19 Vaccine Bivalent Booster 39yrs & up  11/13/2021   Pfizer(Comirnaty)Fall Seasonal Vaccine 12 years and older 08/25/2023   Pneumococcal Conjugate-13 02/06/2021   Pneumococcal Polysaccharide-23 04/02/2022   Td 03/16/2006   Tdap 11/09/2016   Unspecified SARS-COV-2 Vaccination 09/08/2022   Health Maintenance Due  Topic Date Due   Zoster Vaccines- Shingrix (1 of 2) Never done   [employer] [facility] (AWV)  10/05/2023   Colonoscopy  04/03/2024      Past Medical History:  Diagnosis Date   Allergic rhinitis    Anemia    NOS   Anxiety    Bronchiectasis (HCC) 01/05/2019   Depression    Fibromyalgia    Headache(784.0)    HNP (herniated nucleus pulposus), lumbar    L 2-3   Hyperlipidemia    Migraines    Osteopenia    Restless leg syndrome    STD (sexually transmitted disease)    HSV that outbreaks mid back   Unspecified vitamin D  deficiency    Past Surgical History:  Procedure Laterality Date   AUGMENTATION MAMMAPLASTY Bilateral 04/2000   saline   BACK SURGERY     BREAST SURGERY     COLONOSCOPY  10/2004   normal   LUMBAR LAMINECTOMY     2024   SHOULDER SURGERY Right    SIGMOIDOSCOPY  2011   TUBAL LIGATION     bilat    reports that she  has never smoked. She has never used smokeless tobacco. She reports current alcohol use of about 4.0 standard drinks of alcohol per week. She reports that she does not use drugs. family history includes Depression in her mother and sister; Heart disease in her father and maternal grandfather; Hypertension in her maternal grandmother; Osteoarthritis in her mother; Osteoporosis in her maternal grandmother; Ovarian cancer (age of onset: 31) in her mother; Ulcers in her maternal grandmother. Allergies  Allergen Reactions   Clindamycin/Lincomycin Other (See Comments)    C-Diff   Current Outpatient Medications on File Prior to Visit  Medication Sig Dispense Refill   aspirin  EC 81 MG tablet Take 1 tablet (81 mg total) by mouth daily. Swallow whole.      Aspirin -Acetaminophen -Caffeine (GOODY HEADACHE PO) Take by mouth.     ezetimibe  (ZETIA ) 10 MG tablet Zetia      SUMAtriptan  (IMITREX ) 100 MG tablet TAKE AS DIRECTED 9 tablet 13   Tart Cherry 500 MG CAPS      traMADol  (ULTRAM ) 50 MG tablet TAKE 1 TABLET (50 MG TOTAL) BY MOUTH DAILY AS NEEDED. FOR PAIN 30 tablet 2   Turmeric (QC TUMERIC COMPLEX PO) Take by mouth.     No current facility-administered medications on file prior to visit.        ROS:  All others reviewed and negative.  Objective        PE:  BP 120/70 (BP Location: Right Arm, Patient Position: Sitting, Cuff Size: Normal)   Pulse 70   Temp 98.4 F (36.9 C) (Oral)   Ht 5\' 9"  (1.753 m)   Wt 150 lb (68 kg)   LMP 07/13/2006 (Approximate)   SpO2 98%   BMI 22.15 kg/m                 Constitutional: Pt appears in NAD               HENT: Head: NCAT.                Right Ear: External ear normal.                 Left Ear: External ear normal.                Eyes: . Pupils are equal, round, and reactive to light. Conjunctivae and EOM are normal               Nose: without d/c or deformity               Neck: Neck supple. Gross normal ROM               Cardiovascular: Normal rate and regular rhythm.                 Pulmonary/Chest: Effort normal and breath sounds without rales or wheezing.                Abd:  Soft, NT, ND, + BS, no organomegaly               Neurological: Pt is alert. At baseline orientation, motor grossly intact               Skin: Skin is warm. No rashes, no other new lesions, LE edema - none               Psychiatric: Pt behavior is normal without agitation, mild nervous   Micro: none  Cardiac tracings I have personally interpreted today:  none  Pertinent Radiological findings (summarize): none   Lab Results  Component Value Date   WBC 7.3 04/21/2023   HGB 12.1 04/21/2023   HCT 35.3 (L) 04/21/2023   PLT 256.0 04/21/2023   GLUCOSE 93 04/21/2023   CHOL 181 03/02/2024   TRIG 93 03/02/2024   HDL 73  03/02/2024   LDLDIRECT 133.2 09/14/2013   LDLCALC 89 03/02/2024   ALT 18 04/21/2023   AST 23 04/21/2023   NA 140 04/21/2023   K 4.4 04/21/2023   CL 104 04/21/2023   CREATININE 1.11 04/21/2023   BUN 24 (H) 04/21/2023   CO2 29 04/21/2023   TSH 1.78 04/21/2023   INR 1.0 04/21/2018   HGBA1C 5.9 04/21/2023   Assessment/Plan:  Christina Waller is a 68 y.o. White or Caucasian [1] female with  has a past medical history of Allergic rhinitis, Anemia, Anxiety, Bronchiectasis (HCC) (01/05/2019), Depression, Fibromyalgia, Headache(784.0), HNP (herniated nucleus pulposus), lumbar, Hyperlipidemia, Migraines, Osteopenia, Restless leg syndrome, STD (sexually transmitted disease), and Unspecified vitamin D  deficiency.  Encounter for well adult exam with abnormal findings Age and sex appropriate education and counseling updated with regular exercise and diet Referrals for preventative services - declines colonoscopy Immunizations addressed - for shingrix at pharmacy Smoking counseling  - none needed Evidence for depression or other mood disorder - chronic anxiety, stable Most recent labs reviewed. I have personally reviewed and have noted: 1) the patient's medical and social history 2) The patient's current medications and supplements 3) The patient's height, weight, and BMI have been recorded in the chart   HLD (hyperlipidemia) Lab Results  Component Value Date   LDLCALC 89 03/02/2024   Improved but goal ldl < 70, pt to continue crestor  40 mg every day, zetia  10 every day, and declines change zetia  to repatha for now; for f/u lab only in 3 months with other labs as ordered   Migraine Chronic persistent, needs preventive tx - for Nurtec 75 mg every other day, fu neuro as planned  Hyperglycemia Lab Results  Component Value Date   HGBA1C 5.9 04/21/2023   Stable, pt to continue current medical treatment  - diet, wt control   Anxiety Chronic stable, cont klonopin  bid prn  Vitamin D   deficiency Last vitamin D  Lab Results  Component Value Date   VD25OH 33.17 04/21/2023   Low, to start oral replacement   Followup: Return in about 1 year (around 04/06/2025).  Rosalia Colonel, MD 04/07/2024 1:35 PM [facility]

## 2024-04-06 NOTE — Patient Instructions (Signed)
 Ok to try the Nurtec again as you have a contraindication for the imitrex  known now due to heart artery blockages  Please call if you would want to try change the Zetia  to Reptha 140 mg every 2 wks  Please continue all other medications as before, and refills have been done if requested.  Please have the pharmacy call with any other refills you may need.  Please continue your efforts at being more active, low cholesterol diet, and weight control.  You are otherwise up to date with prevention measures today.  Please keep your appointments with your specialists as you may have planned  Please return in 3 months as you mentioned for LAB Testing only  You will be contacted by phone if any changes need to be made immediately.  Otherwise, you will receive a letter about your results with an explanation, but please check with MyChart first.  Please make an Appointment to return for your 1 year visit, or sooner if needed

## 2024-04-07 ENCOUNTER — Encounter: Payer: Self-pay | Admitting: Internal Medicine

## 2024-04-07 DIAGNOSIS — E559 Vitamin D deficiency, unspecified: Secondary | ICD-10-CM | POA: Insufficient documentation

## 2024-04-07 DIAGNOSIS — F419 Anxiety disorder, unspecified: Secondary | ICD-10-CM | POA: Insufficient documentation

## 2024-04-07 NOTE — Assessment & Plan Note (Signed)
Lab Results  Component Value Date   HGBA1C 5.9 04/21/2023   Stable, pt to continue current medical treatment  - diet, wt control

## 2024-04-07 NOTE — Assessment & Plan Note (Addendum)
 Lab Results  Component Value Date   LDLCALC 89 03/02/2024   Improved but goal ldl < 70, pt to continue crestor  40 mg every day, zetia  10 every day, and declines change zetia  to repatha for now; for f/u lab only in 3 months with other labs as ordered

## 2024-04-07 NOTE — Assessment & Plan Note (Signed)
 Age and sex appropriate education and counseling updated with regular exercise and diet Referrals for preventative services - declines colonoscopy Immunizations addressed - for shingrix at pharmacy Smoking counseling  - none needed Evidence for depression or other mood disorder - chronic anxiety, stable Most recent labs reviewed. I have personally reviewed and have noted: 1) the patient's medical and social history 2) The patient's current medications and supplements 3) The patient's height, weight, and BMI have been recorded in the chart

## 2024-04-07 NOTE — Assessment & Plan Note (Signed)
Chronic stable, cont klonopin bid prn

## 2024-04-07 NOTE — Assessment & Plan Note (Signed)
 Chronic persistent, needs preventive tx - for Nurtec 75 mg every other day, fu neuro as planned

## 2024-04-07 NOTE — Assessment & Plan Note (Signed)
 Last vitamin D  Lab Results  Component Value Date   VD25OH 33.17 04/21/2023   Low, to start oral replacement

## 2024-04-10 ENCOUNTER — Ambulatory Visit: Payer: PPO | Admitting: Internal Medicine

## 2024-04-11 DIAGNOSIS — M25511 Pain in right shoulder: Secondary | ICD-10-CM | POA: Diagnosis not present

## 2024-04-18 DIAGNOSIS — M25511 Pain in right shoulder: Secondary | ICD-10-CM | POA: Diagnosis not present

## 2024-04-26 DIAGNOSIS — M75111 Incomplete rotator cuff tear or rupture of right shoulder, not specified as traumatic: Secondary | ICD-10-CM | POA: Diagnosis not present

## 2024-04-26 DIAGNOSIS — M25511 Pain in right shoulder: Secondary | ICD-10-CM | POA: Diagnosis not present

## 2024-05-17 DIAGNOSIS — Z808 Family history of malignant neoplasm of other organs or systems: Secondary | ICD-10-CM | POA: Diagnosis not present

## 2024-05-17 DIAGNOSIS — D1801 Hemangioma of skin and subcutaneous tissue: Secondary | ICD-10-CM | POA: Diagnosis not present

## 2024-05-17 DIAGNOSIS — Z85828 Personal history of other malignant neoplasm of skin: Secondary | ICD-10-CM | POA: Diagnosis not present

## 2024-05-17 DIAGNOSIS — L570 Actinic keratosis: Secondary | ICD-10-CM | POA: Diagnosis not present

## 2024-05-17 DIAGNOSIS — D225 Melanocytic nevi of trunk: Secondary | ICD-10-CM | POA: Diagnosis not present

## 2024-05-17 DIAGNOSIS — L578 Other skin changes due to chronic exposure to nonionizing radiation: Secondary | ICD-10-CM | POA: Diagnosis not present

## 2024-05-17 DIAGNOSIS — L814 Other melanin hyperpigmentation: Secondary | ICD-10-CM | POA: Diagnosis not present

## 2024-05-17 DIAGNOSIS — L821 Other seborrheic keratosis: Secondary | ICD-10-CM | POA: Diagnosis not present

## 2024-05-17 DIAGNOSIS — D2271 Melanocytic nevi of right lower limb, including hip: Secondary | ICD-10-CM | POA: Diagnosis not present

## 2024-05-23 DIAGNOSIS — R600 Localized edema: Secondary | ICD-10-CM | POA: Diagnosis not present

## 2024-05-23 DIAGNOSIS — M25511 Pain in right shoulder: Secondary | ICD-10-CM | POA: Diagnosis not present

## 2024-05-25 ENCOUNTER — Ambulatory Visit (INDEPENDENT_AMBULATORY_CARE_PROVIDER_SITE_OTHER)

## 2024-05-25 VITALS — Ht 69.0 in | Wt 150.0 lb

## 2024-05-25 DIAGNOSIS — Z0000 Encounter for general adult medical examination without abnormal findings: Secondary | ICD-10-CM | POA: Diagnosis not present

## 2024-05-25 DIAGNOSIS — K635 Polyp of colon: Secondary | ICD-10-CM | POA: Diagnosis not present

## 2024-05-25 NOTE — Patient Instructions (Signed)
 Christina Waller , Thank you for taking time out of your busy schedule to complete your Annual Wellness Visit with me. I enjoyed our conversation and look forward to speaking with you again next year. I, as well as your care team,  appreciate your ongoing commitment to your health goals. Please review the following plan we discussed and let me know if I can assist you in the future. Your Game plan/ To Do List    Follow up Visits: Next Medicare AWV with our clinical staff: 05/30/2025   Have you seen your provider in the last 6 months (3 months if uncontrolled diabetes)? Yes Next Office Visit with your provider: 04/11/2025  Clinician Recommendations:  Aim for 30 minutes of exercise or brisk walking, 6-8 glasses of water, and 5 servings of fruits and vegetables each day. Educated and advised on getting the Shingles and COVID vaccines in 2025.  Referral to [facility] for a repeat Colonoscopy.      This is a list of the screening recommended for you and due dates:  Health Maintenance  Topic Date Due   Zoster (Shingles) Vaccine (1 of 2) Never done   Colon Cancer Screening  04/03/2024   Flu Shot  07/13/2024   Mammogram  01/11/2025   Medicare Annual Wellness Visit  05/25/2025   DTaP/Tdap/Td vaccine (3 - Td or Tdap) 11/09/2026   Pneumococcal Vaccine for age over 62  Completed   DEXA scan (bone density measurement)  Completed   Hepatitis C Screening  Completed   HPV Vaccine  Aged Out   Meningitis B Vaccine  Aged Out   COVID-19 Vaccine  Discontinued    Advanced directives: (Copy Requested) Please bring a copy of your health care power of attorney and living will to the office to be added to your chart at your convenience. You can mail to [facility] [address] [place], [place] [address] or email to [email or web address] Advance Care Planning is important because it:  [x]  Makes sure you receive the medical care that is consistent with your values, goals, and preferences  [x]   It provides guidance to your family and loved ones and reduces their decisional burden about whether or not they are making the right decisions based on your wishes.  Follow the link provided in your after visit summary or read over the paperwork we have mailed to you to help you started getting your Advance Directives in place. If you need assistance in completing these, please reach out to us  so that we can help you!  Managing Pain Without Opioids Opioids are strong medicines used to treat moderate to severe pain. For some people, especially those who have long-term (chronic) pain, opioids may not be the best choice for pain management due to: Side effects like nausea, constipation, and sleepiness. The risk of addiction (opioid use disorder). The longer you take opioids, the greater your risk of addiction. Pain that lasts for more than 3 months is called chronic pain. Managing chronic pain usually requires more than one approach and is often provided by a team of health care providers working together (multidisciplinary approach). Pain management may be done at a pain management center or pain clinic. How to manage pain without the use of opioids Use non-opioid medicines Non-opioid medicines for pain may include: Over-the-counter or prescription non-steroidal anti-inflammatory drugs (NSAIDs). These may be the first medicines used for pain. They work well for muscle and bone pain, and they reduce swelling. Acetaminophen . This over-the-counter medicine may  work well for milder pain but not swelling. Antidepressants. These may be used to treat chronic pain. A certain type of antidepressant (tricyclics) is often used. These medicines are given in lower doses for pain than when used for depression. Anticonvulsants. These are usually used to treat seizures but may also reduce nerve (neuropathic) pain. Muscle relaxants. These relieve pain caused by sudden muscle tightening (spasms). You may also use a  pain medicine that is applied to the skin as a patch, cream, or gel (topical analgesic), such as a numbing medicine. These may cause fewer side effects than medicines taken by mouth. Do certain therapies as directed Some therapies can help with pain management. They include: Physical therapy. You will do exercises to gain strength and flexibility. A physical therapist may teach you exercises to move and stretch parts of your body that are weak, stiff, or painful. You can learn these exercises at physical therapy visits and practice them at home. Physical therapy may also involve: Massage. Heat wraps or applying heat or cold to affected areas. Electrical signals that interrupt pain signals (transcutaneous electrical nerve stimulation, TENS). Weak lasers that reduce pain and swelling (low-level laser therapy). Signals from your body that help you learn to regulate pain (biofeedback). Occupational therapy. This helps you to learn ways to function at home and work with less pain. Recreational therapy. This involves trying new activities or hobbies, such as a physical activity or drawing. Mental health therapy, including: Cognitive behavioral therapy (CBT). This helps you learn coping skills for dealing with pain. Acceptance and commitment therapy (ACT) to change the way you think and react to pain. Relaxation therapies, including muscle relaxation exercises and mindfulness-based stress reduction. Pain management counseling. This may be individual, family, or group counseling.  Receive medical treatments Medical treatments for pain management include: Nerve block injections. These may include a pain blocker and anti-inflammatory medicines. You may have injections: Near the spine to relieve chronic back or neck pain. Into joints to relieve back or joint pain. Into nerve areas that supply a painful area to relieve body pain. Into muscles (trigger point injections) to relieve some painful muscle  conditions. A medical device placed near your spine to help block pain signals and relieve nerve pain or chronic back pain (spinal cord stimulation device). Acupuncture. Follow these instructions at home Medicines Take over-the-counter and prescription medicines only as told by your health care provider. If you are taking pain medicine, ask your health care providers about possible side effects to watch out for. Do not drive or use heavy machinery while taking prescription opioid pain medicine. Lifestyle  Do not use drugs or alcohol to reduce pain. If you drink alcohol, limit how much you have to: 0-1 drink a day for women who are not pregnant. 0-2 drinks a day for men. Know how much alcohol is in a drink. In the [place] one drink equals one 12 oz bottle of beer (355 mL), one 5 oz glass of wine (148 mL), or one 1 oz glass of hard liquor (44 mL). Do not use any products that contain nicotine or tobacco. These products include cigarettes, chewing tobacco, and vaping devices, such as e-cigarettes. If you need help quitting, ask your health care provider. Eat a healthy diet and maintain a healthy weight. Poor diet and excess weight may make pain worse. Eat foods that are high in fiber. These include fresh fruits and vegetables, whole grains, and beans. Limit foods that are high in fat and processed sugars, such as  fried and sweet foods. Exercise regularly. Exercise lowers stress and may help relieve pain. Ask your health care provider what activities and exercises are safe for you. If your health care provider approves, join an exercise class that combines movement and stress reduction. Examples include yoga and tai chi. Get enough sleep. Lack of sleep may make pain worse. Lower stress as much as possible. Practice stress reduction techniques as told by your therapist. General instructions Work with all your pain management providers to find the treatments that work best for you. You are an  important member of your pain management team. There are many things you can do to reduce pain on your own. Consider joining an online or in-person support group for people who have chronic pain. Keep all follow-up visits. This is important. Where to find more information You can find more information about managing pain without opioids from: American Academy of Pain Medicine: painmed.org Institute for Chronic Pain: instituteforchronicpain.org American Chronic Pain Association: theacpa.org Contact a health care provider if: You have side effects from pain medicine. Your pain gets worse or does not get better with treatments or home therapy. You are struggling with anxiety or depression. Summary Many types of pain can be managed without opioids. Chronic pain may respond better to pain management without opioids. Pain is best managed when you and a team of health care providers work together. Pain management without opioids may include non-opioid medicines, medical treatments, physical therapy, mental health therapy, and lifestyle changes. Tell your health care providers if your pain gets worse or is not being managed well enough. This information is not intended to replace advice given to you by your health care provider. Make sure you discuss any questions you have with your health care provider. Document Revised: 03/11/2021 Document Reviewed: 03/11/2021 Elsevier Patient Education  2024 [employer].

## 2024-05-25 NOTE — Progress Notes (Addendum)
 Subjective:  Please attest and cosign this visit due to patients primary care provider not being in the office at the time the visit was completed.  (Pt of Dr Rosalia Colonel)   Christina Waller is a 68 y.o. who presents for a Medicare Wellness preventive visit.  As a reminder, Annual Wellness Visits don't include a physical exam, and some assessments may be limited, especially if this visit is performed virtually. We may recommend an in-person follow-up visit with your provider if needed.  Visit Complete: Virtual I connected with  Christina Waller on 05/25/24 by a audio enabled telemedicine application and verified that I am speaking with the correct person using two identifiers.  Patient Location: Home  Provider Location: Office/Clinic  I discussed the limitations of evaluation and management by telemedicine. The patient expressed understanding and agreed to proceed.  Vital Signs: Because this visit was a virtual/telehealth visit, some criteria may be missing or patient reported. Any vitals not documented were not able to be obtained and vitals that have been documented are patient reported.  VideoDeclined- This patient declined Librarian, academic. Therefore the visit was completed with audio only.  Persons Participating in Visit: Patient.  AWV Questionnaire: No: Patient Medicare AWV questionnaire was not completed prior to this visit.  Cardiac Risk Factors include: advanced age (>45men, >12 women);dyslipidemia     Objective:    Today's Vitals   05/25/24 1013  Weight: 150 lb (68 kg)  Height: 5' 9 (1.753 m)   Body mass index is 22.15 kg/m.     05/25/2024   10:12 AM 10/04/2022    3:06 PM 12/25/2020    4:09 PM  Advanced Directives  Does Patient Have a Medical Advance Directive? Yes No No  Type of Estate agent of [place];Living will    Copy of Healthcare Power of Attorney in Chart? No - copy requested    Would patient like  information on creating a medical advance directive?  Yes (MAU/Ambulatory/Procedural Areas - Information given) Yes (MAU/Ambulatory/Procedural Areas - Information given)    Current Medications (verified) Outpatient Encounter Medications as of 05/25/2024  Medication Sig   aspirin  EC 81 MG tablet Take 1 tablet (81 mg total) by mouth daily. Swallow whole.   Aspirin -Acetaminophen -Caffeine (GOODY HEADACHE PO) Take by mouth.   clonazePAM  (KLONOPIN ) 0.5 MG tablet Take 1 tablet (0.5 mg total) by mouth 2 (two) times daily as needed.   cyclobenzaprine  (FLEXERIL ) 10 MG tablet Take 1 tablet (10 mg total) by mouth 3 (three) times daily as needed for muscle spasms.   DULoxetine  (CYMBALTA ) 60 MG capsule Take 1 capsule (60 mg total) by mouth 2 (two) times daily.   ezetimibe  (ZETIA ) 10 MG tablet Zetia    metoprolol  tartrate (LOPRESSOR ) 25 MG tablet Take 0.5 tablets (12.5 mg total) by mouth 2 (two) times daily as needed.   Rimegepant Sulfate (NURTEC) 75 MG TBDP Take one tab by mouth every other day for prevention;   rosuvastatin  (CRESTOR ) 40 MG tablet Take 1 tablet (40 mg total) by mouth daily.   SUMAtriptan  (IMITREX ) 100 MG tablet TAKE AS DIRECTED   Tart Cherry 500 MG CAPS    traMADol  (ULTRAM ) 50 MG tablet TAKE 1 TABLET (50 MG TOTAL) BY MOUTH DAILY AS NEEDED. FOR PAIN   Turmeric (QC TUMERIC COMPLEX PO) Take by mouth.   valACYclovir  (VALTREX ) 1000 MG tablet Take 1 tablet (1,000 mg total) by mouth 2 (two) times daily.   No facility-administered encounter medications on file as of  05/25/2024.    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   Unspecified vitamin D  deficiency    Past Surgical History:  Procedure Laterality Date   AUGMENTATION MAMMAPLASTY Bilateral 04/2000   saline   BACK SURGERY     BREAST SURGERY     COLONOSCOPY  10/2004   normal   LUMBAR LAMINECTOMY     2024   SHOULDER SURGERY Right    SIGMOIDOSCOPY  2011   TUBAL LIGATION     bilat   Family History  Problem Relation Age of Onset   Ovarian cancer Mother 43   Osteoarthritis Mother    Depression Mother    Heart disease Father    Depression Sister    Hypertension Maternal Grandmother    Osteoporosis Maternal Grandmother    Ulcers Maternal Grandmother    Heart disease Maternal Grandfather    Colon cancer Neg Hx    Colon polyps Neg Hx    Esophageal cancer Neg Hx    Rectal cancer Neg Hx    Stomach cancer Neg Hx    BRCA 1/2 Neg Hx    Breast cancer Neg Hx    Social History   Socioeconomic History   Marital status: Divorced    Spouse name: Not on file   Number of children: 3   Years of education: Not on file   Highest education level: Associate degree: occupational, Scientist, product/process development, or vocational program  Occupational History   Occupation: Designer, multimedia: [employer]  Tobacco Use   Smoking status: Never   Smokeless tobacco: Never  Vaping Use   Vaping status: Never Used  Substance and Sexual Activity   Alcohol use: Yes    Alcohol/week: 4.0 standard drinks of alcohol    Types: 4 Glasses of wine per week    Comment: with dinner   Drug use: No   Sexual activity: Yes    Partners: Male    Birth control/protection: Surgical    Comment: BTL  Other Topics Concern   Not on file  Social History Narrative   Daily caffeine use 1 per day   Social Drivers of Health   Financial Resource Strain: Low Risk  (05/25/2024)   Overall Financial Resource Strain (CARDIA)    Difficulty of Paying Living Expenses: Not hard at all  Food Insecurity: No Food Insecurity (05/25/2024)   Hunger Vital Sign    Worried About Running Out of  Food in the Last Year: Never true    Ran Out of Food in the Last Year: Never true  Transportation Needs: No Transportation Needs (05/25/2024)   PRAPARE - Administrator, Civil Service (Medical): No    Lack of Transportation (Non-Medical): No  Physical Activity: Insufficiently Active (05/25/2024)   Exercise Vital Sign    Days of Exercise per Week: 2 days    Minutes of Exercise per Session: 60 min  Stress: No Stress Concern Present (05/25/2024)   [employer] of Occupational Health - Occupational Stress Questionnaire  Feeling of Stress: Only a little  Social Connections: Moderately Integrated (05/25/2024)   Social Connection and Isolation Panel    Frequency of Communication with Friends and Family: Three times a week    Frequency of Social Gatherings with Friends and Family: Twice a week    Attends Religious Services: More than 4 times per year    Active Member of [employer] or Organizations: Yes    Attends Engineer, structural: More than 4 times per year    Marital Status: Divorced    Tobacco Counseling Counseling given: No    Clinical Intake:  Pre-visit preparation completed: Yes  Pain : No/denies pain     BMI - recorded: 22.15 Nutritional Status: BMI of 19-24  Normal Nutritional Risks: None Diabetes: No  Lab Results  Component Value Date   HGBA1C 5.9 04/21/2023   HGBA1C 5.6 02/12/2022     How often do you need to have someone help you when you read instructions, pamphlets, or other written materials from your doctor or pharmacy?: 1 - Never  Interpreter Needed?: No  Information entered by :: Kandy Orris, CMA   Activities of Daily Living     05/25/2024   10:15 AM 11/11/2023    9:20 AM  In your present state of health, do you have any difficulty performing the following activities:  Hearing? 0 0  Vision? 0 0  Difficulty concentrating or making decisions? 0 1  Walking or climbing stairs? 0 0  Dressing or bathing? 0 0  Doing errands,  shopping? 0 0  Preparing Food and eating ? N N  Using the Toilet? N N  In the past six months, have you accidently leaked urine? N Y  Do you have problems with loss of bowel control? N N  Managing your Medications? N N  Managing your Finances? N N  Housekeeping or managing your Housekeeping? N N    Patient Care Team: Roslyn Coombe, MD as PCP - General Constancia Delton, MD as PCP - Cardiology (Cardiology) Zula Hitch, MD as Consulting Physician (Ophthalmology)  I have updated your Care Teams any recent Medical Services you may have received from other providers in the past year.     Assessment:   This is a routine wellness examination for Christina Waller.  Hearing/Vision screen Hearing Screening - Comments:: Denies hearing difficulties   Vision Screening - Comments:: Wears rx glasses - up to date with routine eye exams with Dr Joanne Muckle   Goals Addressed               This Visit's Progress     Patient Stated (pt-stated)        Patient stated she plans to restart yoga exercises - exp. Right shoulder        Depression Screen     05/25/2024   10:19 AM 04/06/2024    1:19 PM 09/30/2023    1:28 PM 04/21/2023    1:09 PM 01/07/2023    2:37 PM 10/04/2022    3:19 PM 04/02/2022   11:22 AM  PHQ 2/9 Scores  PHQ - 2 Score 0 0 0 1 0 0 0  PHQ- 9 Score 0   3 0      Fall Risk     05/25/2024   10:17 AM 04/06/2024    1:23 PM 11/11/2023    9:20 AM 09/30/2023    1:27 PM 04/21/2023    1:09 PM  Fall Risk   Falls in the past year? 1 1 0 0 0  Number falls in past yr: 0 0 0 0 0  Injury with Fall? 0 0 0 0 0  Risk for fall due to :  History of fall(s)  No Fall Risks No Fall Risks  Follow up Falls evaluation completed;Falls prevention discussed Falls evaluation completed  Falls evaluation completed Falls evaluation completed    MEDICARE RISK AT HOME:  Medicare Risk at Home Any stairs in or around the home?: Yes If so, are there any without handrails?: No Home free of loose throw rugs in  walkways, pet beds, electrical cords, etc?: Yes Adequate lighting in your home to reduce risk of falls?: Yes Life alert?: No Use of a cane, walker or w/c?: No Grab bars in the bathroom?: Yes Shower chair or bench in shower?: No Elevated toilet seat or a handicapped toilet?: No  TIMED UP AND GO:  Was the test performed?  No  Cognitive Function: 6CIT completed      01/09/2024    2:38 PM 07/08/2022    8:07 AM  Montreal Cognitive Assessment   Visuospatial/ Executive (0/5) 5 5  Naming (0/3) 3 3  Attention: Read list of digits (0/2) 2 2  Attention: Read list of letters (0/1) 1 1  Attention: Serial 7 subtraction starting at 100 (0/3) 3 3  Language: Repeat phrase (0/2) 2 2  Language : Fluency (0/1) 1 1  Abstraction (0/2) 2 2  Delayed Recall (0/5) 4 1  Orientation (0/6) 6 6  Total 29 26      05/25/2024   10:21 AM 10/04/2022    3:21 PM  6CIT Screen  What Year? 0 points 0 points  What month? 0 points 0 points  What time? 0 points 0 points  Count back from 20 0 points 0 points  Months in reverse 0 points 0 points  Repeat phrase 0 points 0 points  Total Score 0 points 0 points    Immunizations Immunization History  Administered Date(s) Administered   Fluad Quad(high Dose 65+) 10/18/2020   Fluad Trivalent(High Dose 65+) 08/25/2023   Influenza Inj Mdck Quad Pf 11/15/2018   Influenza Split 09/03/2011   Influenza, High Dose Seasonal PF 09/08/2022   Influenza,inj,Quad PF,6+ Mos 09/14/2013, 09/13/2014, 10/23/2015, 11/09/2016, 09/16/2017, 11/22/2019   Influenza-Unspecified 09/08/2022   PFIZER Comirnaty(Gray Top)Covid-19 Tri-Sucrose Vaccine 09/08/2022   PFIZER(Purple Top)SARS-COV-2 Vaccination 12/28/2019, 01/18/2020, 10/09/2020   PPD Test 09/04/2012   Pfizer Covid-19 Vaccine Bivalent Booster 90yrs & up 11/13/2021   Pfizer(Comirnaty)Fall Seasonal Vaccine 12 years and older 08/25/2023   Pneumococcal Conjugate-13 02/06/2021   Pneumococcal Polysaccharide-23 04/02/2022   Td  03/16/2006   Tdap 11/09/2016   Unspecified SARS-COV-2 Vaccination 09/08/2022    Screening Tests Health Maintenance  Topic Date Due   Zoster Vaccines- Shingrix (1 of 2) Never done   Colonoscopy  04/03/2024   INFLUENZA VACCINE  07/13/2024   MAMMOGRAM  01/11/2025   Medicare Annual Wellness (AWV)  05/25/2025   DTaP/Tdap/Td (3 - Td or Tdap) 11/09/2026   Pneumococcal Vaccine: 50+ Years  Completed   DEXA SCAN  Completed   Hepatitis C Screening  Completed   HPV VACCINES  Aged Out   Meningococcal B Vaccine  Aged Out   COVID-19 Vaccine  Discontinued    Health Maintenance  Health Maintenance Due  Topic Date Due   Zoster Vaccines- Shingrix (1 of 2) Never done   Colonoscopy  04/03/2024   Health Maintenance Items Addressed:  Referral to [facility] - due for a repeat Colonoscopy due having colon polyps   Additional  Screening:  Vision Screening: Recommended annual ophthalmology exams for early detection of glaucoma and other disorders of the eye. Would you like a referral to an eye doctor? No  Pt stated she's had an annual eye exam in 2025 w/Dr Joanne Muckle.  Dental Screening: Recommended annual dental exams for proper oral hygiene  Community Resource Referral / Chronic Care Management: CRR required this visit?  No   CCM required this visit?  No   Plan:    I have personally reviewed and noted the following in the patient's chart:   Medical and social history Use of alcohol, tobacco or illicit drugs  Current medications and supplements including opioid prescriptions. Patient is currently taking opioid prescriptions. Information provided to patient regarding non-opioid alternatives. Patient advised to discuss non-opioid treatment plan with their provider. Functional ability and status Nutritional status Physical activity Advanced directives List of other physicians Hospitalizations, surgeries, and ER visits in previous 12 months Vitals Screenings to include cognitive, depression,  and falls Referrals and appointments  In addition, I have reviewed and discussed with patient certain preventive protocols, quality metrics, and best practice recommendations. A written personalized care plan for preventive services as well as general preventive health recommendations were provided to patient.   Patria Bookbinder, CMA   05/25/2024   After Visit Summary: (MyChart) Due to this being a telephonic visit, the after visit summary with patients personalized plan was offered to patient via MyChart   Notes: Nothing significant to report at this time.

## 2024-06-01 DIAGNOSIS — R600 Localized edema: Secondary | ICD-10-CM | POA: Diagnosis not present

## 2024-06-01 DIAGNOSIS — M25511 Pain in right shoulder: Secondary | ICD-10-CM | POA: Diagnosis not present

## 2024-06-07 DIAGNOSIS — R600 Localized edema: Secondary | ICD-10-CM | POA: Diagnosis not present

## 2024-06-07 DIAGNOSIS — M25511 Pain in right shoulder: Secondary | ICD-10-CM | POA: Diagnosis not present

## 2024-06-14 DIAGNOSIS — M25511 Pain in right shoulder: Secondary | ICD-10-CM | POA: Diagnosis not present

## 2024-06-14 DIAGNOSIS — R600 Localized edema: Secondary | ICD-10-CM | POA: Diagnosis not present

## 2024-06-21 DIAGNOSIS — M25811 Other specified joint disorders, right shoulder: Secondary | ICD-10-CM | POA: Diagnosis not present

## 2024-07-05 ENCOUNTER — Other Ambulatory Visit: Payer: Self-pay | Admitting: Internal Medicine

## 2024-08-10 ENCOUNTER — Encounter: Payer: Self-pay | Admitting: Internal Medicine

## 2024-09-03 ENCOUNTER — Other Ambulatory Visit: Payer: Self-pay | Admitting: Internal Medicine

## 2024-10-03 ENCOUNTER — Other Ambulatory Visit: Payer: Self-pay

## 2024-10-03 ENCOUNTER — Other Ambulatory Visit: Payer: Self-pay | Admitting: Internal Medicine

## 2024-10-05 ENCOUNTER — Other Ambulatory Visit: Payer: Self-pay | Admitting: Internal Medicine

## 2024-10-11 DIAGNOSIS — M9903 Segmental and somatic dysfunction of lumbar region: Secondary | ICD-10-CM | POA: Diagnosis not present

## 2024-10-11 DIAGNOSIS — M5137 Other intervertebral disc degeneration, lumbosacral region with discogenic back pain only: Secondary | ICD-10-CM | POA: Diagnosis not present

## 2024-10-11 DIAGNOSIS — M25552 Pain in left hip: Secondary | ICD-10-CM | POA: Diagnosis not present

## 2024-10-11 DIAGNOSIS — M9905 Segmental and somatic dysfunction of pelvic region: Secondary | ICD-10-CM | POA: Diagnosis not present

## 2024-10-15 DIAGNOSIS — M9905 Segmental and somatic dysfunction of pelvic region: Secondary | ICD-10-CM | POA: Diagnosis not present

## 2024-10-15 DIAGNOSIS — M5137 Other intervertebral disc degeneration, lumbosacral region with discogenic back pain only: Secondary | ICD-10-CM | POA: Diagnosis not present

## 2024-10-15 DIAGNOSIS — M25552 Pain in left hip: Secondary | ICD-10-CM | POA: Diagnosis not present

## 2024-10-15 DIAGNOSIS — M9903 Segmental and somatic dysfunction of lumbar region: Secondary | ICD-10-CM | POA: Diagnosis not present

## 2024-10-16 DIAGNOSIS — M5137 Other intervertebral disc degeneration, lumbosacral region with discogenic back pain only: Secondary | ICD-10-CM | POA: Diagnosis not present

## 2024-10-16 DIAGNOSIS — M9905 Segmental and somatic dysfunction of pelvic region: Secondary | ICD-10-CM | POA: Diagnosis not present

## 2024-10-16 DIAGNOSIS — M25552 Pain in left hip: Secondary | ICD-10-CM | POA: Diagnosis not present

## 2024-10-16 DIAGNOSIS — M9903 Segmental and somatic dysfunction of lumbar region: Secondary | ICD-10-CM | POA: Diagnosis not present

## 2024-10-25 DIAGNOSIS — M25552 Pain in left hip: Secondary | ICD-10-CM | POA: Diagnosis not present

## 2024-10-25 DIAGNOSIS — M9905 Segmental and somatic dysfunction of pelvic region: Secondary | ICD-10-CM | POA: Diagnosis not present

## 2024-10-25 DIAGNOSIS — M9903 Segmental and somatic dysfunction of lumbar region: Secondary | ICD-10-CM | POA: Diagnosis not present

## 2024-10-25 DIAGNOSIS — M5137 Other intervertebral disc degeneration, lumbosacral region with discogenic back pain only: Secondary | ICD-10-CM | POA: Diagnosis not present

## 2024-10-29 DIAGNOSIS — M9903 Segmental and somatic dysfunction of lumbar region: Secondary | ICD-10-CM | POA: Diagnosis not present

## 2024-10-29 DIAGNOSIS — M5137 Other intervertebral disc degeneration, lumbosacral region with discogenic back pain only: Secondary | ICD-10-CM | POA: Diagnosis not present

## 2024-10-29 DIAGNOSIS — M9905 Segmental and somatic dysfunction of pelvic region: Secondary | ICD-10-CM | POA: Diagnosis not present

## 2024-10-29 DIAGNOSIS — M25552 Pain in left hip: Secondary | ICD-10-CM | POA: Diagnosis not present

## 2024-10-31 DIAGNOSIS — M5137 Other intervertebral disc degeneration, lumbosacral region with discogenic back pain only: Secondary | ICD-10-CM | POA: Diagnosis not present

## 2024-10-31 DIAGNOSIS — M9903 Segmental and somatic dysfunction of lumbar region: Secondary | ICD-10-CM | POA: Diagnosis not present

## 2024-10-31 DIAGNOSIS — M25552 Pain in left hip: Secondary | ICD-10-CM | POA: Diagnosis not present

## 2024-10-31 DIAGNOSIS — M9905 Segmental and somatic dysfunction of pelvic region: Secondary | ICD-10-CM | POA: Diagnosis not present

## 2024-11-05 DIAGNOSIS — M9903 Segmental and somatic dysfunction of lumbar region: Secondary | ICD-10-CM | POA: Diagnosis not present

## 2024-11-05 DIAGNOSIS — M25552 Pain in left hip: Secondary | ICD-10-CM | POA: Diagnosis not present

## 2024-11-05 DIAGNOSIS — M9905 Segmental and somatic dysfunction of pelvic region: Secondary | ICD-10-CM | POA: Diagnosis not present

## 2024-11-05 DIAGNOSIS — M5137 Other intervertebral disc degeneration, lumbosacral region with discogenic back pain only: Secondary | ICD-10-CM | POA: Diagnosis not present

## 2024-11-06 DIAGNOSIS — M5137 Other intervertebral disc degeneration, lumbosacral region with discogenic back pain only: Secondary | ICD-10-CM | POA: Diagnosis not present

## 2024-11-06 DIAGNOSIS — M9905 Segmental and somatic dysfunction of pelvic region: Secondary | ICD-10-CM | POA: Diagnosis not present

## 2024-11-06 DIAGNOSIS — M25552 Pain in left hip: Secondary | ICD-10-CM | POA: Diagnosis not present

## 2024-11-06 DIAGNOSIS — M9903 Segmental and somatic dysfunction of lumbar region: Secondary | ICD-10-CM | POA: Diagnosis not present

## 2024-11-12 DIAGNOSIS — M5137 Other intervertebral disc degeneration, lumbosacral region with discogenic back pain only: Secondary | ICD-10-CM | POA: Diagnosis not present

## 2024-11-12 DIAGNOSIS — M25552 Pain in left hip: Secondary | ICD-10-CM | POA: Diagnosis not present

## 2024-11-12 DIAGNOSIS — M9905 Segmental and somatic dysfunction of pelvic region: Secondary | ICD-10-CM | POA: Diagnosis not present

## 2024-11-12 DIAGNOSIS — M9903 Segmental and somatic dysfunction of lumbar region: Secondary | ICD-10-CM | POA: Diagnosis not present

## 2024-11-14 DIAGNOSIS — M5137 Other intervertebral disc degeneration, lumbosacral region with discogenic back pain only: Secondary | ICD-10-CM | POA: Diagnosis not present

## 2024-11-14 DIAGNOSIS — M9903 Segmental and somatic dysfunction of lumbar region: Secondary | ICD-10-CM | POA: Diagnosis not present

## 2024-11-14 DIAGNOSIS — M25552 Pain in left hip: Secondary | ICD-10-CM | POA: Diagnosis not present

## 2024-11-14 DIAGNOSIS — M9905 Segmental and somatic dysfunction of pelvic region: Secondary | ICD-10-CM | POA: Diagnosis not present

## 2024-11-19 DIAGNOSIS — M9905 Segmental and somatic dysfunction of pelvic region: Secondary | ICD-10-CM | POA: Diagnosis not present

## 2024-11-19 DIAGNOSIS — M5137 Other intervertebral disc degeneration, lumbosacral region with discogenic back pain only: Secondary | ICD-10-CM | POA: Diagnosis not present

## 2024-11-19 DIAGNOSIS — M25552 Pain in left hip: Secondary | ICD-10-CM | POA: Diagnosis not present

## 2024-11-19 DIAGNOSIS — M9903 Segmental and somatic dysfunction of lumbar region: Secondary | ICD-10-CM | POA: Diagnosis not present

## 2024-11-21 DIAGNOSIS — M5137 Other intervertebral disc degeneration, lumbosacral region with discogenic back pain only: Secondary | ICD-10-CM | POA: Diagnosis not present

## 2024-11-21 DIAGNOSIS — M25552 Pain in left hip: Secondary | ICD-10-CM | POA: Diagnosis not present

## 2024-11-21 DIAGNOSIS — M9905 Segmental and somatic dysfunction of pelvic region: Secondary | ICD-10-CM | POA: Diagnosis not present

## 2024-11-21 DIAGNOSIS — M9903 Segmental and somatic dysfunction of lumbar region: Secondary | ICD-10-CM | POA: Diagnosis not present

## 2024-11-22 DIAGNOSIS — G43909 Migraine, unspecified, not intractable, without status migrainosus: Secondary | ICD-10-CM | POA: Diagnosis not present

## 2024-11-22 DIAGNOSIS — H5213 Myopia, bilateral: Secondary | ICD-10-CM | POA: Diagnosis not present

## 2024-12-01 ENCOUNTER — Other Ambulatory Visit: Payer: Self-pay | Admitting: Internal Medicine

## 2025-01-09 NOTE — Progress Notes (Unsigned)
 "   [facility]  PATIENT: Christina Waller DOB: [birth year]  REQUESTING CLINICIAN: Norleen Waller ORN, MD HISTORY FROM: Patient  REASON FOR VISIT: Memory deficit    HISTORICAL  CHIEF COMPLAINT:  No chief complaint on file.  HISTORY OF PRESENT ILLNESS:    Update 01/10/2025 JM: Patient returns for follow-up visit.           INTERVAL HISTORY 01/09/2024 Dr. Gregg:  Patient presents today for follow-up, last visit was in July 2023, since then, she tells me that she continues to have headaches on average 8-10 headaches per month.  She tells me that sumatriptan  is helpful.  She did not pick up the Nurtec even though it was approved.  Patient tells me that Imitrex  was better as abortive medication than Nurtec.  Denies any changes in her migraine severity. In terms of her memory, she feels it is the same, she has to rely on her calendar, has to write things down to avoid forgetting.  She is still independent in all actives of daily living, she still works, no recent changes. She has been taking Provasil and Auri Mushroom supplement for her memory.   Consult visit 07/08/2022 Dr. Gregg: This is a 69 year old woman past medical history of CAD, hyperlipidemia, anxiety, depression, and fibromyalgia who is presenting with memory complaint.  Patient described the memory problem as being forgetful, she reports event where she put eggs on the stove and forgot them until they break.  She does report a history of forgetting names of celebrities or but does not have issue with names of family members.  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  She still drives denies being lost in familiar places, but reported 1 episode of confusion about which grocery store she was in.  She still pays her bills, she lives alone and is very independent. She reported her mother was diagnosed with dementia at the age of 34.  She also has a history of migraine headaches,  currently not on any preventive headaches but gets more than 10 headaches a month. She is on Imitrex  as migraine abortive. In the past, she has tried Topiramate , Duloxetine , Gabapentin.     TBI:   No past history of TBI Stroke:   no past history of stroke Seizures:   no past history of seizures Sleep:   no history of sleep apnea.   Mood:  Yes, anxiety and depression  Functional status: independent in all ADLs and IADLs Patient lives alone. Cooking: Yes  Cleaning: Yes  Shopping: doing ok  Bathing: no help needed  Toileting: no help needed  Driving: Yes, no recent accident  Bills: Yes, pay them on time   Ever left the stove on by accident?: Yes  Forget how to use items around the house?: No  Getting lost going to familiar places?: Yes Forgetting loved ones names?: No Word finding difficulty? Yes  Sleep: Good    OTHER MEDICAL CONDITIONS: CAD, Hyperlipidemia, Fibromyalgia, anxiety and depression    REVIEW OF SYSTEMS: Full 14 system review of systems performed and negative with exception of: As noted in the HPI    ALLERGIES: Allergies  Allergen Reactions   Clindamycin/Lincomycin Other (See Comments)    C-Diff    HOME MEDICATIONS: Outpatient Medications Prior to Visit  Medication Sig Dispense Refill   aspirin  EC 81 MG tablet Take 1 tablet (81 mg total) by mouth daily. Swallow whole.     Aspirin -Acetaminophen -Caffeine (GOODY  HEADACHE PO) Take by mouth.     clonazePAM  (KLONOPIN ) 0.5 MG tablet TAKE 1 TABLET BY MOUTH TWICE A DAY AS NEEDED 60 tablet 3   cyclobenzaprine  (FLEXERIL ) 10 MG tablet TAKE 1 TABLET BY MOUTH THREE TIMES A DAY AS NEEDED FOR MUSCLE SPASMS 90 tablet 2   DULoxetine  (CYMBALTA ) 60 MG capsule Take 1 capsule (60 mg total) by mouth 2 (two) times daily. 180 capsule 3   ezetimibe  (ZETIA ) 10 MG tablet TAKE 1 TABLET BY MOUTH EVERY DAY 90 tablet 3   metoprolol  tartrate (LOPRESSOR ) 25 MG tablet Take 0.5 tablets (12.5 mg total) by mouth 2 (two) times daily as needed. 90  tablet 3   Rimegepant Sulfate (NURTEC) 75 MG TBDP Take one tab by mouth every other day for prevention; 16 tablet 11   rosuvastatin  (CRESTOR ) 40 MG tablet Take 1 tablet (40 mg total) by mouth daily. 90 tablet 3   SUMAtriptan  (IMITREX ) 100 MG tablet TAKE AS DIRECTED 9 tablet 13   Tart Cherry 500 MG CAPS      traMADol  (ULTRAM ) 50 MG tablet TAKE 1 TABLET (50 MG TOTAL) BY MOUTH DAILY AS NEEDED FOR PAIN. 30 tablet 2   Turmeric (QC TUMERIC COMPLEX PO) Take by mouth.     valACYclovir  (VALTREX ) 1000 MG tablet Take 1 tablet (1,000 mg total) by mouth 2 (two) times daily. 180 tablet 1   No facility-administered medications prior to visit.    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   Unspecified vitamin D  deficiency     PAST SURGICAL HISTORY: Past Surgical History:  Procedure Laterality Date   AUGMENTATION MAMMAPLASTY Bilateral 04/2000   saline   BACK SURGERY     BREAST SURGERY     COLONOSCOPY  10/2004   normal   LUMBAR LAMINECTOMY     2024   SHOULDER SURGERY Right    SIGMOIDOSCOPY  2011   TUBAL LIGATION     bilat    FAMILY HISTORY: Family History  Problem Relation Age of Onset   Ovarian cancer Mother 31   Osteoarthritis Mother    Depression Mother    Heart disease Father    Depression Sister    Hypertension Maternal Grandmother    Osteoporosis Maternal Grandmother    Ulcers Maternal Grandmother    Heart disease Maternal Grandfather    Colon cancer Neg Hx    Colon polyps Neg Hx    Esophageal cancer Neg Hx    Rectal cancer Neg Hx    Stomach cancer Neg Hx    BRCA 1/2 Neg Hx    Breast cancer Neg Hx     SOCIAL HISTORY: Social  History   Socioeconomic History   Marital status: Divorced    Spouse name: Not on file   Number of children: 3   Years of education: Not on file   Highest education level: Associate degree: occupational, scientist, product/process development, or vocational program  Occupational History   Occupation: Designer, [employer]: [employer]  Tobacco Use   Smoking status: Never  Smokeless tobacco: Never  Vaping Use   Vaping status: Never Used  Substance and Sexual Activity   Alcohol use: Yes    Alcohol/week: 4.0 standard drinks of alcohol    Types: 4 Glasses of wine per week    Comment: with dinner   Drug use: No   Sexual activity: Yes    Partners: Male    Birth control/protection: Surgical    Comment: BTL  Other Topics Concern   Not on file  Social History Narrative   Daily caffeine use 1 per day   Social Drivers of Health   Tobacco Use: Low Risk (05/25/2024)   Patient History    Smoking Tobacco Use: Never    Smokeless Tobacco Use: Never    Passive Exposure: Not on file  Financial Resource Strain: Low Risk (05/25/2024)   Overall Financial Resource Strain (CARDIA)    Difficulty of Paying Living Expenses: Not hard at all  Food Insecurity: No Food Insecurity (05/25/2024)   Epic    Worried About Programme Researcher, Broadcasting/film/video in the Last Year: Never true    Ran Out of Food in the Last Year: Never true  Transportation Needs: No Transportation Needs (05/25/2024)   Epic    Lack of Transportation (Medical): No    Lack of Transportation (Non-Medical): No  Physical Activity: Insufficiently Active (05/25/2024)   Exercise Vital Sign    Days of Exercise per Week: 2 days    Minutes of Exercise per Session: 60 min  Stress: No Stress Concern Present (05/25/2024)   [employer] of Occupational Health - Occupational Stress Questionnaire    Feeling of Stress: Only a little  Social Connections: Moderately Integrated (05/25/2024)   Social Connection and Isolation Panel    Frequency of Communication with Friends and  Family: Three times a week    Frequency of Social Gatherings with Friends and Family: Twice a week    Attends Religious Services: More than 4 times per year    Active Member of Clubs or Organizations: Yes    Attends Banker Meetings: More than 4 times per year    Marital Status: Divorced  Intimate Partner Violence: Not At Risk (05/25/2024)   Epic    Fear of Current or Ex-Partner: No    Emotionally Abused: No    Physically Abused: No    Sexually Abused: No  Depression (PHQ2-9): Low Risk (05/25/2024)   Depression (PHQ2-9)    PHQ-2 Score: 0  Alcohol Screen: Low Risk (05/25/2024)   Alcohol Screen    Last Alcohol Screening Score (AUDIT): 2  Housing: Unknown (05/25/2024)   Epic    Unable to Pay for Housing in the Last Year: No    Number of Times Moved in the Last Year: Not on file    Homeless in the Last Year: No  Utilities: Not At Risk (05/25/2024)   Epic    Threatened with loss of utilities: No  Health Literacy: Adequate Health Literacy (05/25/2024)   B1300 Health Literacy    Frequency of need for help with medical instructions: Never    PHYSICAL EXAM  GENERAL EXAM/CONSTITUTIONAL: Vitals:  There were no vitals filed for this visit.  There is no height or weight on file to calculate BMI. Wt Readings from Last 3 Encounters:  05/25/24 150 lb (68 kg)  04/06/24 150 lb (68 kg)  04/05/24 150 lb 8 oz (68.3 kg)   Patient is in no distress; well developed, nourished and groomed; neck is supple  MUSCULOSKELETAL: Gait, strength, tone, movements  noted in Neurologic exam below  NEUROLOGIC: MENTAL STATUS:      No data to display            01/09/2024    2:38 PM 07/08/2022    8:07 AM  Montreal Cognitive Assessment   Visuospatial/ Executive (0/5) 5 5  Naming (0/3) 3 3  Attention: Read list of digits (0/2) 2 2  Attention: Read list of letters (0/1) 1 1  Attention: Serial 7 subtraction starting at 100 (0/3) 3 3  Language: Repeat phrase (0/2) 2 2  Language : Fluency  (0/1) 1 1  Abstraction (0/2) 2 2  Delayed Recall (0/5) 4 1  Orientation (0/6) 6 6  Total 29 26    CRANIAL NERVE:  2nd, 3rd, 4th, 6th - pupils equal and reactive to light, visual fields full to confrontation, extraocular muscles intact, no nystagmus 5th - facial sensation symmetric 7th - facial strength symmetric 8th - hearing intact 9th - palate elevates symmetrically, uvula midline 11th - shoulder shrug symmetric 12th - tongue protrusion midline  MOTOR:  normal bulk and tone  GAIT/STATION:  normal   DIAGNOSTIC DATA (LABS, IMAGING, TESTING) - I reviewed patient records, labs, notes, testing and imaging myself where available.  Lab Results  Component Value Date   WBC 7.3 04/21/2023   HGB 12.1 04/21/2023   HCT 35.3 (L) 04/21/2023   MCV 95.3 04/21/2023   PLT 256.0 04/21/2023      Component Value Date/Time   NA 140 04/21/2023 1347   K 4.4 04/21/2023 1347   CL 104 04/21/2023 1347   CO2 29 04/21/2023 1347   GLUCOSE 93 04/21/2023 1347   BUN 24 (H) 04/21/2023 1347   CREATININE 1.11 04/21/2023 1347   CALCIUM  9.3 04/21/2023 1347   PROT 7.0 04/21/2023 1347   ALBUMIN 4.2 04/21/2023 1347   AST 23 04/21/2023 1347   ALT 18 04/21/2023 1347   ALKPHOS 62 04/21/2023 1347   BILITOT 0.4 04/21/2023 1347   GFRNONAA 70.02 06/30/2010 1218   GFRAA 97 04/07/2007 0817   Lab Results  Component Value Date   CHOL 181 03/02/2024   HDL 73 03/02/2024   LDLCALC 89 03/02/2024   LDLDIRECT 133.2 09/14/2013   TRIG 93 03/02/2024   CHOLHDL 2.5 03/02/2024   Lab Results  Component Value Date   HGBA1C 5.9 04/21/2023   Lab Results  Component Value Date   VITAMINB12 >1500 (H) 04/21/2023   Lab Results  Component Value Date   TSH 1.78 04/21/2023     ASSESSMENT AND PLAN  69 y.o. year old female with migraine headaches, CAD, hyperlipidemia, anxiety, depression, and fibromyalgia who returns for migraine management and memory concerns.     Migraine headaches Prevention: Nurtec every  other day Rescue: *** Previously tried/failed: Topiramate , duloxetine , gabapentin, sumatriptan  Memory concerns:     In terms of her memory complaint, she is doing well, still works, independent in all her ADLs and iADLs. Moca 29. Informed patient that she has normal cognition.  For her headaches, we will try her on Nurtec as abortive and have patient try Zavzpret  as abortive. If Zavzpret  not approved, we will continue her on Sumatriptan . Continue to follow up with PCP and return in 6 months or sooner if worse.            No orders of the defined types were placed in this encounter.   I personally spent a total of *** minutes in the care of the patient today including {Time Based Coding:[id number]}.  Harlene Bogaert,  AGNP-BC  [facility] [address] [place], [place] [address]  Phone [phone] Fax [phone] Note: This document was prepared with digital dictation and possible smart phrase technology. Any transcriptional errors that result from this process are unintentional.   "

## 2025-01-10 ENCOUNTER — Encounter: Payer: Self-pay | Admitting: Adult Health

## 2025-01-10 ENCOUNTER — Ambulatory Visit: Payer: PPO | Admitting: Adult Health

## 2025-01-10 VITALS — BP 121/76 | HR 76 | Ht 69.0 in | Wt 154.4 lb

## 2025-01-10 DIAGNOSIS — R419 Unspecified symptoms and signs involving cognitive functions and awareness: Secondary | ICD-10-CM | POA: Diagnosis not present

## 2025-01-10 DIAGNOSIS — G43909 Migraine, unspecified, not intractable, without status migrainosus: Secondary | ICD-10-CM

## 2025-01-10 MED ORDER — SUMATRIPTAN SUCCINATE 100 MG PO TABS: 100.0000 mg | ORAL_TABLET | ORAL | 11 refills | Status: AC | PRN

## 2025-01-10 NOTE — Patient Instructions (Addendum)
 Your Plan:  Continue sumatriptan  as needed for migraine prevention   Continue to monitor your memory - if this should continue to worsen, please let me know and we can discuss other therapy options      Follow up in 1 year or call earlier if needed      Thank you for coming to see us  at [facility]. I hope we have been able to provide you high quality care today.  You may receive a patient satisfaction survey over the next few weeks. We would appreciate your feedback and comments so that we may continue to improve ourselves and the health of our patients.       Brain Health   The six pillars of a brain-healthy, dementia prevention lifestyle are:  Regular exercise - aim for 150 minutes of aerobic activity a week, as well as strengthening and balance exercises  Healthy diet - look into the MIND diet, which is a combination of the Mediterranean diet and the heart-healthy DASH diet  Mental stimulation - keep your mind active with brain teasers, crossword puzzles, reading, and learning new skills (a new language, playing an instrument or new hobby)  Quality sleep - make sure you go to bed and wake up around the same times every day, napping is okay in the early afternoon for no more than 45-60 minutes, get tested for sleep apnea if your snore  Stress management - practice deep breathing exercises, look into meditation (free modules at connectrv.is), make sure to do activities you enjoy daily  An active social life - volunteer, join a social group, make a weekly lunch date with friends, get out in public places (movies, museums, parks)  Also make sure that your taking good care of your health otherwise, with good control of blood pressure, cholesterol and blood sugar.  These types of lifestyle changes have been shown to be much more effective for preventing the progression of dementia than any of the prescription medications we currently have available. The  more you strengthen each of the six pillars in your daily life, the healthier and hardier your brain will be. When you lead a brain-healthy lifestyle, your brain will stay working stronger longer.   EXERCISE Evidence suggests that regular exercise slows down the progression of neurodegenerative diseases (diseases caused by the death of brain cells), and it clearly improves quality of life. The optimal exercise routine includes a cardiovascular work out, stretching and strengthening. A 30 - 45 minute work out 5 times a week is best, but start slowly (10 minutes at a time) and build up over weeks. Find a work-out buddy, as people are more successful when they have someone working out with them.  The recommended type of exercise is an individual choice, because it must be a regimen you enjoy and that will be done regularly for it to be beneficial. Walking briskly is an excellent routine for many. Studies show that varying the intensity of exercise, called interval training, is more effective than exercising at the same level for a longer period. Try walking as quickly as you can for 3 minutes, then recover and walk more slowly for 3 minutes. Repeat this pattern 5 times for a total workout of 30 minutes. Swimming or aquarobics is also excellent, especially for those with arthritis.   Remember, by choosing not to work out today, you are saying I'm going to let those brain cells die today! You can save them with exercise! EXERCISE IS THE ONLY INTERVENTION  THAT HAS BEEN SHOWN TO SLOW DOWN THE PROGRESSION OF THE DISEASE.  DIET The MIND diet is a combination of the Mediterranean diet and the DASH (Dietary Approaches to Stop Hypertension) diet. These diets individually have been shown to reduce the risk of cardiovascular disease, like hypertension, heart attack and stroke. Combined, there is newer evidence that following this diet can slow cognitive decline and even prevent progression to dementia. A study by  [facility] found that older adults who followed the diet rigorously showed an equivalent of being 7.5 years younger cognitively than those who followed the diet least.   To benefit from the MIND diet, a person must eat: 1. At least three servings of whole grains a day 2. A green leafy vegetable and one other vegetable every day 3. Snack most days on nuts 4. Have beans every other day or so 5. Eat poultry at least twice a week 6. Eat berries at least twice a week 7. Eat fish at least once a week 8. Occasional glass of wine   In addition, the study found that to have a real shot at avoiding the devastating effects of cognitive decline, he or she must limit intake of the designated unhealthy foods, especially butter (less than 1 tablespoon a day), sweets and pastries, whole fat cheese, and fried or fast food (less than a serving a week for any of the three).  SLEEP - Sleep only as much as you need to feel well rested. Spending LESS time in bed will help you sleep better while you are in bed.  - Keep a regular sleep schedule: It is most important to GET UP at the same time every day, regardless of how well you have slept the night before. This is hard. You will get better at it with practice.  - Establish relaxing pre-sleep rituals, such as a warm bath, light snack or reading.  - Dont work at falling asleep. Stop pressuring yourself to sleep.  - Reduce unwanted noise and light in your bedroom. (Use a white noise such as a fan, room darkening shades.) Keep a comfortable bedroom temperature. Have a comfortable bed.  - Avoid caffeine, alcohol and tobacco, especially anytime after early afternoon.  - Exercise every day, but not within 3-4 hours before bedtime.  - Dont use your bed, or the bedroom, for anything but sleep and sex. Do your pre-sleep rituals like reading somewhere else.  - Dont lie awake if you cant get to seep, or on awakening, for more than 10-20  minutes. Get up and do something else which is quiet, not-stimulating, or even boring. (NOT TV, computer or video games: These are designed to be stimulating.) Go back to bed when you feel sleepy. Repeat this as often as needed.  - Avoid excessive napping. Keep naps brief, 45-60 minutes maximum. Early afternoon is the best time. Avoid napping if you are not refreshed after the nap.  - Practice relaxation techniques: muscle relaxation (start with head or toes and move down or up your body), sighing, deep breathing (2, slow, deep breaths), counting, practicing creating pleasing mental images.

## 2025-02-28 ENCOUNTER — Encounter: Admitting: General Practice

## 2025-04-11 ENCOUNTER — Encounter: Admitting: Internal Medicine

## 2025-05-30 ENCOUNTER — Ambulatory Visit

## 2026-01-16 ENCOUNTER — Ambulatory Visit: Admitting: Adult Health
